# Patient Record
Sex: Female | Born: 1980 | Race: Black or African American | Hispanic: No | Smoking: Current every day smoker
Health system: Southern US, Community
[De-identification: ages and names within clinical notes are randomized; demographics above are authoritative.]

## PROBLEM LIST (undated history)

## (undated) ENCOUNTER — Inpatient Hospital Stay (HOSPITAL_COMMUNITY): Payer: Self-pay

## (undated) VITALS — BP 121/78 | HR 95 | Temp 97.5°F | Resp 16 | Ht 66.5 in | Wt 195.0 lb

## (undated) DIAGNOSIS — R87629 Unspecified abnormal cytological findings in specimens from vagina: Secondary | ICD-10-CM

## (undated) DIAGNOSIS — N870 Mild cervical dysplasia: Secondary | ICD-10-CM

## (undated) DIAGNOSIS — F4310 Post-traumatic stress disorder, unspecified: Secondary | ICD-10-CM

## (undated) DIAGNOSIS — D649 Anemia, unspecified: Secondary | ICD-10-CM

## (undated) DIAGNOSIS — G43909 Migraine, unspecified, not intractable, without status migrainosus: Secondary | ICD-10-CM

## (undated) DIAGNOSIS — R519 Headache, unspecified: Secondary | ICD-10-CM

## (undated) DIAGNOSIS — N760 Acute vaginitis: Secondary | ICD-10-CM

## (undated) DIAGNOSIS — F32A Depression, unspecified: Secondary | ICD-10-CM

## (undated) DIAGNOSIS — F53 Postpartum depression: Secondary | ICD-10-CM

## (undated) DIAGNOSIS — E049 Nontoxic goiter, unspecified: Secondary | ICD-10-CM

## (undated) DIAGNOSIS — A630 Anogenital (venereal) warts: Secondary | ICD-10-CM

## (undated) DIAGNOSIS — Z8619 Personal history of other infectious and parasitic diseases: Secondary | ICD-10-CM

## (undated) DIAGNOSIS — F329 Major depressive disorder, single episode, unspecified: Secondary | ICD-10-CM

## (undated) DIAGNOSIS — I10 Essential (primary) hypertension: Secondary | ICD-10-CM

## (undated) DIAGNOSIS — F319 Bipolar disorder, unspecified: Secondary | ICD-10-CM

## (undated) DIAGNOSIS — O210 Mild hyperemesis gravidarum: Secondary | ICD-10-CM

## (undated) DIAGNOSIS — F419 Anxiety disorder, unspecified: Secondary | ICD-10-CM

## (undated) DIAGNOSIS — E669 Obesity, unspecified: Secondary | ICD-10-CM

## (undated) DIAGNOSIS — B9689 Other specified bacterial agents as the cause of diseases classified elsewhere: Secondary | ICD-10-CM

## (undated) DIAGNOSIS — E041 Nontoxic single thyroid nodule: Secondary | ICD-10-CM

## (undated) DIAGNOSIS — R51 Headache: Secondary | ICD-10-CM

## (undated) DIAGNOSIS — O99345 Other mental disorders complicating the puerperium: Secondary | ICD-10-CM

## (undated) HISTORY — DX: Other specified bacterial agents as the cause of diseases classified elsewhere: B96.89

## (undated) HISTORY — DX: Depression, unspecified: F32.A

## (undated) HISTORY — DX: Anemia, unspecified: D64.9

## (undated) HISTORY — DX: Mild hyperemesis gravidarum: O21.0

## (undated) HISTORY — DX: Anxiety disorder, unspecified: F41.9

## (undated) HISTORY — DX: Mild cervical dysplasia: N87.0

## (undated) HISTORY — DX: Migraine, unspecified, not intractable, without status migrainosus: G43.909

## (undated) HISTORY — DX: Essential (primary) hypertension: I10

## (undated) HISTORY — DX: Anogenital (venereal) warts: A63.0

## (undated) HISTORY — DX: Personal history of other infectious and parasitic diseases: Z86.19

## (undated) HISTORY — DX: Post-traumatic stress disorder, unspecified: F43.10

## (undated) HISTORY — PX: WISDOM TOOTH EXTRACTION: SHX21

## (undated) HISTORY — DX: Other specified bacterial agents as the cause of diseases classified elsewhere: N76.0

## (undated) HISTORY — DX: Major depressive disorder, single episode, unspecified: F32.9

## (undated) HISTORY — DX: Obesity, unspecified: E66.9

---
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
## 2004-03-25 ENCOUNTER — Emergency Department (HOSPITAL_COMMUNITY): Admission: EM | Admit: 2004-03-25 | Discharge: 2004-03-25 | Payer: Self-pay | Admitting: Emergency Medicine

## 2004-10-24 ENCOUNTER — Inpatient Hospital Stay (HOSPITAL_COMMUNITY): Admission: AD | Admit: 2004-10-24 | Discharge: 2004-10-24 | Payer: Self-pay | Admitting: Obstetrics & Gynecology

## 2004-12-04 ENCOUNTER — Inpatient Hospital Stay: Admission: AD | Admit: 2004-12-04 | Discharge: 2004-12-04 | Payer: Self-pay | Admitting: *Deleted

## 2005-01-11 ENCOUNTER — Other Ambulatory Visit: Admission: RE | Admit: 2005-01-11 | Discharge: 2005-01-11 | Payer: Self-pay | Admitting: Obstetrics and Gynecology

## 2005-02-11 ENCOUNTER — Inpatient Hospital Stay (HOSPITAL_COMMUNITY): Admission: AD | Admit: 2005-02-11 | Discharge: 2005-02-11 | Payer: Self-pay | Admitting: Obstetrics and Gynecology

## 2005-03-15 ENCOUNTER — Other Ambulatory Visit: Admission: RE | Admit: 2005-03-15 | Discharge: 2005-03-15 | Payer: Self-pay | Admitting: Obstetrics and Gynecology

## 2005-05-05 ENCOUNTER — Inpatient Hospital Stay (HOSPITAL_COMMUNITY): Admission: AD | Admit: 2005-05-05 | Discharge: 2005-05-05 | Payer: Self-pay | Admitting: Obstetrics and Gynecology

## 2005-05-07 ENCOUNTER — Inpatient Hospital Stay (HOSPITAL_COMMUNITY): Admission: AD | Admit: 2005-05-07 | Discharge: 2005-05-07 | Payer: Self-pay | Admitting: *Deleted

## 2005-06-05 ENCOUNTER — Inpatient Hospital Stay (HOSPITAL_COMMUNITY): Admission: AD | Admit: 2005-06-05 | Discharge: 2005-06-05 | Payer: Self-pay | Admitting: Obstetrics and Gynecology

## 2005-06-28 ENCOUNTER — Inpatient Hospital Stay (HOSPITAL_COMMUNITY): Admission: AD | Admit: 2005-06-28 | Discharge: 2005-07-02 | Payer: Self-pay | Admitting: Obstetrics and Gynecology

## 2005-07-06 ENCOUNTER — Inpatient Hospital Stay (HOSPITAL_COMMUNITY): Admission: AD | Admit: 2005-07-06 | Discharge: 2005-07-07 | Payer: Self-pay | Admitting: Obstetrics and Gynecology

## 2006-01-29 ENCOUNTER — Emergency Department (HOSPITAL_COMMUNITY): Admission: EM | Admit: 2006-01-29 | Discharge: 2006-01-29 | Payer: Self-pay | Admitting: Emergency Medicine

## 2007-05-06 ENCOUNTER — Emergency Department (HOSPITAL_COMMUNITY): Admission: EM | Admit: 2007-05-06 | Discharge: 2007-05-07 | Payer: Self-pay | Admitting: Emergency Medicine

## 2007-05-07 ENCOUNTER — Inpatient Hospital Stay (HOSPITAL_COMMUNITY): Admission: RE | Admit: 2007-05-07 | Discharge: 2007-05-10 | Payer: Self-pay | Admitting: Psychiatry

## 2007-05-07 ENCOUNTER — Ambulatory Visit: Payer: Self-pay | Admitting: Psychiatry

## 2007-05-15 ENCOUNTER — Ambulatory Visit (HOSPITAL_COMMUNITY): Payer: Self-pay | Admitting: Marriage and Family Therapist

## 2007-05-24 ENCOUNTER — Ambulatory Visit (HOSPITAL_COMMUNITY): Payer: Self-pay | Admitting: Marriage and Family Therapist

## 2007-07-15 ENCOUNTER — Ambulatory Visit (HOSPITAL_COMMUNITY): Payer: Self-pay | Admitting: Marriage and Family Therapist

## 2007-07-24 ENCOUNTER — Ambulatory Visit (HOSPITAL_COMMUNITY): Payer: Self-pay | Admitting: Marriage and Family Therapist

## 2007-07-30 ENCOUNTER — Ambulatory Visit (HOSPITAL_COMMUNITY): Payer: Self-pay | Admitting: Marriage and Family Therapist

## 2008-04-10 DIAGNOSIS — O210 Mild hyperemesis gravidarum: Secondary | ICD-10-CM

## 2008-04-10 HISTORY — DX: Mild hyperemesis gravidarum: O21.0

## 2008-11-03 ENCOUNTER — Inpatient Hospital Stay (HOSPITAL_COMMUNITY): Admission: AD | Admit: 2008-11-03 | Discharge: 2008-11-03 | Payer: Self-pay | Admitting: Obstetrics and Gynecology

## 2009-01-16 ENCOUNTER — Inpatient Hospital Stay (HOSPITAL_COMMUNITY): Admission: AD | Admit: 2009-01-16 | Discharge: 2009-01-17 | Payer: Self-pay | Admitting: Obstetrics and Gynecology

## 2009-01-28 ENCOUNTER — Other Ambulatory Visit: Payer: Self-pay | Admitting: Obstetrics and Gynecology

## 2009-01-28 ENCOUNTER — Inpatient Hospital Stay (HOSPITAL_COMMUNITY): Admission: AD | Admit: 2009-01-28 | Discharge: 2009-01-31 | Payer: Self-pay | Admitting: Obstetrics and Gynecology

## 2010-07-14 LAB — CBC
HCT: 27.8 % — ABNORMAL LOW (ref 36.0–46.0)
HCT: 30.8 % — ABNORMAL LOW (ref 36.0–46.0)
Hemoglobin: 10 g/dL — ABNORMAL LOW (ref 12.0–15.0)
Hemoglobin: 9.3 g/dL — ABNORMAL LOW (ref 12.0–15.0)
MCHC: 32.6 g/dL (ref 30.0–36.0)
MCHC: 33.3 g/dL (ref 30.0–36.0)
MCV: 90.5 fL (ref 78.0–100.0)
MCV: 91.1 fL (ref 78.0–100.0)
Platelets: 221 10*3/uL (ref 150–400)
Platelets: 262 10*3/uL (ref 150–400)
RBC: 3.08 MIL/uL — ABNORMAL LOW (ref 3.87–5.11)
RBC: 3.38 MIL/uL — ABNORMAL LOW (ref 3.87–5.11)
RDW: 14 % (ref 11.5–15.5)
RDW: 14 % (ref 11.5–15.5)
WBC: 8.4 10*3/uL (ref 4.0–10.5)
WBC: 9.6 10*3/uL (ref 4.0–10.5)

## 2010-07-14 LAB — URINALYSIS, ROUTINE W REFLEX MICROSCOPIC
Bilirubin Urine: NEGATIVE
Bilirubin Urine: NEGATIVE
Glucose, UA: NEGATIVE mg/dL
Glucose, UA: NEGATIVE mg/dL
Hgb urine dipstick: NEGATIVE
Hgb urine dipstick: NEGATIVE
Ketones, ur: 15 mg/dL — AB
Ketones, ur: 15 mg/dL — AB
Nitrite: NEGATIVE
Nitrite: NEGATIVE
Protein, ur: NEGATIVE mg/dL
Protein, ur: NEGATIVE mg/dL
Specific Gravity, Urine: 1.02 (ref 1.005–1.030)
Specific Gravity, Urine: 1.02 (ref 1.005–1.030)
Urobilinogen, UA: 2 mg/dL — ABNORMAL HIGH (ref 0.0–1.0)
Urobilinogen, UA: 1 mg/dL (ref 0.0–1.0)
pH: 6.5 (ref 5.0–8.0)
pH: 6 (ref 5.0–8.0)

## 2010-07-14 LAB — URINE MICROSCOPIC-ADD ON

## 2010-07-14 LAB — CCBB MATERNAL DONOR DRAW

## 2010-07-14 LAB — RPR: RPR Ser Ql: NONREACTIVE

## 2010-07-17 LAB — URINALYSIS, MICROSCOPIC ONLY
Bilirubin Urine: NEGATIVE
Glucose, UA: NEGATIVE mg/dL
Hgb urine dipstick: NEGATIVE
Ketones, ur: NEGATIVE mg/dL
Leukocytes, UA: NEGATIVE
Nitrite: POSITIVE — AB
Protein, ur: NEGATIVE mg/dL
Specific Gravity, Urine: 1.025 (ref 1.005–1.030)
Urobilinogen, UA: 1 mg/dL (ref 0.0–1.0)
pH: 7 (ref 5.0–8.0)

## 2010-07-17 LAB — URINE CULTURE: Colony Count: 7000
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
## 2010-08-26 NOTE — Op Note (Signed)
NAME:  Monica [name], Monica [name] NO.:  [id number]   MEDICAL RECORD NO.:  [id number]          PATIENT TYPE:  INP   LOCATION:  9141                          [facility]ACILITY:  [facility]   PHYSICIAN:  Janine Limbo, M.D.DATE OF BIRTH:  [birth year]   DATE OF PROCEDURE:  06/30/2005  DATE OF DISCHARGE:                                 OPERATIVE REPORT   PREOPERATIVE DIAGNOSES:  1.  Postdates pregnancy.  2.  Chorioamnionitis.  3.  Nonreassuring fetal heart rate tracing.   POSTOPERATIVE DIAGNOSES:  1.  Postdates pregnancy.  2.  Chorioamnionitis.  3.  Nonreassuring fetal heart rate tracing.   PROCEDURE:  Primary low transverse cesarean section.   SURGEON:  Dr. Leonard Schwartz   FIRST ASSISTANT:  Elsie Ra, Certified Nurse Midwife.   ANESTHETIC:  Epidural.   DISPOSITION:  Monica [name] is a 30 year old female, gravida 1 para 0, who  was admitted for induction of labor on June 28, 2005. She was given Cytotec  on March 21,2007, and then Pitocin was started on the morning of March  22,2007. The patient labored slowly. She did dilate her cervix to 7 cm. She  began having variable and late decelerations. An amnioinfusion was begun.  The variables did improve, but then the patient elevated her baseline to 165  beats per minute, and she lost her variability. The patient was noted to  have a temperature of 102. We reviewed our management options, and I  recommended that we proceed with cesarean delivery. The risk and benefits of  all her options were discussed. The patient elected to proceed with cesarean  section. She understood the specific risks associated with cesarean section  which included, but are not limited to, anesthetic complications, bleeding,  infection, and possible damage to the surrounding organs.   FINDINGS:  A 7 pounds 5 ounce female infant Market researcher) was delivered from a  cephalic presentation. The Apgars were 8 at one minute and 9 at five  minutes. The cord  blood pH was 7.29. The uterus, fallopian tubes, and the  ovaries were normal for the gravid state.   PROCEDURE:  The patient was taken to the operating room where it was  determined that the epidural she had received for labor would be adequate  for cesarean delivery. The patient's abdomen was prepped with multiple  layers of Betadine. A Foley catheter had previously been placed. The patient  was then sterilely draped. The lower abdomen was injected with 10 mL of 0.5%  Marcaine with epinephrine. A low transverse incision was made in the abdomen  and carried sharply through the subcutaneous tissue, the fascia, and the  anterior peritoneum. An incision was made in the lower uterine segment and  extended transversely. The fetal head was delivered with the assistance of a  Mityvac vacuum extractor. The mouth and nose were suctioned. The remainder  of the infant was then delivered. The infant was noted to have a significant  amount of kaput. Routine cord blood studies were obtained. The placenta was  then removed. The uterine cavity was cleaned of amniotic fluid, clotted  blood, and membranes. The uterine incision was  closed using a running suture  of 2-0 Vicryl followed by an imbricating suture of 2-0 Vicryl. Hemostasis  was adequate. The pelvis was vigorously irrigated. The anterior peritoneum  and the abdominal musculature were reapproximated in the midline using 2-0  Vicryl. The fascia and the subcutaneous layer were irrigated. The fascia was  closed using a running suture of 0 Vicryl followed by 3 interrupted sutures  of 0 Vicryl. A Jackson-Pratt drain was placed in the subcutaneous layer and  brought out through the left lower quadrant. It was sutured into place using  3-0 silk. The subcutaneous layer was closed using a running suture of 0  Vicryl. The skin was reapproximated using a subcuticular suture of 3-0  Monocryl. Sponge, needle, and instrument counts were correct on 2 occasions.   The estimated blood loss was 800 mL. The patient tolerated her procedure  well. The patient was taken to the recovery room in stable condition. The  infant was taken to the full-term nursery in stable condition.      Janine Limbo, M.D.  Electronically Signed     AVS/MEDQ  D:  06/30/2005  T:  07/01/2005  Job:  [id number]
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
## 2010-08-26 NOTE — Discharge Summary (Signed)
NAME:  Monica [name], Monica [name] NO.:  [id number]   MEDICAL RECORD NO.:  [id number]          PATIENT TYPE:  INP   LOCATION:  9141                          FACILITY:  [facility]   PHYSICIAN:  Hal Morales, M.D.DATE OF BIRTH:  Aug 16, 1980   DATE OF ADMISSION:  06/28/2005  DATE OF DISCHARGE:  07/02/2005                                 DISCHARGE SUMMARY   ADMISSION DIAGNOSIS:  Intrauterine pregnancy at term, induction of labor,  post dates.   POSTOPERATIVE DIAGNOSIS:  Post dates pregnancy, chorioamnionitis,  nonreassuring fetal heart rate tracing.   PROCEDURE:  Primary low transverse cesarean section.   DISCHARGE DIAGNOSIS:  Intrauterine pregnancy delivered, chorioamnionitis,  nonreassuring fetal heart rate tracing and primary low transverse cesarean  section.   [name] is a 30 year old gravida 1, para 0 who presents at 41-2/7 weeks  for induction of labor and she received Cytotec vaginally on 06/29/2005 x2  doses and began Pitocin the morning of 03/22. At that time she was noted to  be 2 cm dilated. She progressed throughout the day to 7-8 cm dilated.  Fetal  heart rate baseline increased to 160's-170's. Variability decreased at that  time. The patient began to have variable decelerations and nonrepetitive  late decelerations. Her temperature became elevated to 100.7. Consult with  Dr. Stefano Gaul who came in to review strip and discuss options with the  patient. In light of fetal heart rate tracing and suspicion of  chorioamnionitis, C-section was recommended. The risks, benefits and  alternatives were reviewed.  The patient verbalized her understanding and  decision to proceed with cesarean delivery.  This was performed on  06/30/2005 with Dr. Marline Backbone as surgeon.  The patient gave birth to a  7 pound 5 ounce female infant named Kaidence with Apgar scores of 8 at 1-  minute and 9 at 5 minutes.  Cord pH 7.29. The patient has done well in the  postoperative period.   Her temperature elevated to 101.3 at 3:30 a.m. on  06/30/2005.  She has been afebrile since that time. She received Cefotan 1  gram IV for 24 hours and has remained afebrile with her vital signs stable.  Her hemoglobin on the first postoperative day was 8.8 down from 10.4. The  patient has remained asymptomatic in her anemia and declined blood  transfusion.  She is up and moving without any dizziness or any problems.  Her JP drain was removed on her first postoperative day without difficulty.  Her incision is clean, dry and intact and on this her second postoperative  day she requested early discharge in light of her stable condition.  She is  discharged home with instructions per [facility] handout.   DISCHARGE MEDICATIONS:  1.  Motrin 600 mg p.o. q.6 h p.r.n. pain.  2.  Tylox one to two p.o. q. 3-4 hours p.r.n. pain.  3.  Prenatal vitamins.   Discharge follow-up will be at [facility] in 6 weeks.  The patient will decide on  contraceptive options at that time.      Rica Koyanagi, C.N.M.      Hal Morales, M.D.  Electronically Signed    SDM/MEDQ  D:  07/02/2005  T:  07/04/2005  Job:  [id number]

## 2010-09-05 ENCOUNTER — Emergency Department (HOSPITAL_COMMUNITY)
Admission: EM | Admit: 2010-09-05 | Discharge: 2010-09-06 | Disposition: A | Discharge status: Other Acute Inpatient Hospital | Payer: Managed Care, Other (non HMO) | Attending: Emergency Medicine | Admitting: Emergency Medicine

## 2010-09-05 DIAGNOSIS — T50901A Poisoning by unspecified drugs, medicaments and biological substances, accidental (unintentional), initial encounter: Secondary | ICD-10-CM | POA: Insufficient documentation

## 2010-09-05 DIAGNOSIS — R404 Transient alteration of awareness: Secondary | ICD-10-CM | POA: Insufficient documentation

## 2010-09-05 DIAGNOSIS — T50902A Poisoning by unspecified drugs, medicaments and biological substances, intentional self-harm, initial encounter: Secondary | ICD-10-CM | POA: Insufficient documentation

## 2010-09-05 DIAGNOSIS — F329 Major depressive disorder, single episode, unspecified: Secondary | ICD-10-CM | POA: Insufficient documentation

## 2010-09-05 DIAGNOSIS — F3289 Other specified depressive episodes: Secondary | ICD-10-CM | POA: Insufficient documentation

## 2010-09-05 LAB — SALICYLATE LEVEL: Salicylate Lvl: <2 mg/dL — ABNORMAL LOW (ref 2.8–20.0)

## 2010-09-05 LAB — RAPID URINE DRUG SCREEN, HOSP PERFORMED
Amphetamines: NOT DETECTED
Barbiturates: NOT DETECTED
Benzodiazepines: NOT DETECTED
Cocaine: NOT DETECTED
Opiates: NOT DETECTED
Tetrahydrocannabinol: NOT DETECTED

## 2010-09-05 LAB — DIFFERENTIAL
Basophils Absolute: 0 10*3/uL (ref 0.0–0.1)
Basophils Relative: 1 % (ref 0–1)
Eosinophils Relative: 3 % (ref 0–5)
Lymphocytes Relative: 46 % (ref 12–46)
Lymphs Abs: 2.4 10*3/uL (ref 0.7–4.0)
Monocytes Relative: 9 % (ref 3–12)
Neutro Abs: 2.2 10*3/uL (ref 1.7–7.7)
Neutrophils Relative %: 41 % — ABNORMAL LOW (ref 43–77)

## 2010-09-05 LAB — COMPREHENSIVE METABOLIC PANEL
ALT: 15 U/L (ref 0–35)
AST: 22 U/L (ref 0–37)
Albumin: 3.4 g/dL — ABNORMAL LOW (ref 3.5–5.2)
Alkaline Phosphatase: 43 U/L (ref 39–117)
CO2: 22 mEq/L (ref 19–32)
Calcium: 8.5 mg/dL (ref 8.4–10.5)
Chloride: 106 mEq/L (ref 96–112)
Creatinine, Ser: 0.74 mg/dL (ref 0.4–1.2)
GFR calc Af Amer: >60 mL/min (ref >60)
Potassium: 3.3 mEq/L — ABNORMAL LOW (ref 3.5–5.1)
Sodium: 139 mEq/L (ref 135–145)
Total Bilirubin: 0.4 mg/dL (ref 0.3–1.2)
Total Protein: 7 g/dL (ref 6.0–8.3)

## 2010-09-05 LAB — CBC
HCT: 36.3 % (ref 36.0–46.0)
Hemoglobin: 11.7 g/dL — ABNORMAL LOW (ref 12.0–15.0)
MCH: 30.1 pg (ref 26.0–34.0)
MCHC: 32.2 g/dL (ref 30.0–36.0)
MCV: 93.3 fL (ref 78.0–100.0)
Platelets: 211 10*3/uL (ref 150–400)
RBC: 3.89 MIL/uL (ref 3.87–5.11)
RDW: 12.8 % (ref 11.5–15.5)
WBC: 5.2 10*3/uL (ref 4.0–10.5)

## 2010-09-06 ENCOUNTER — Inpatient Hospital Stay (HOSPITAL_COMMUNITY)
Admission: AD | Admit: 2010-09-06 | Discharge: 2010-09-09 | DRG: 885 | Disposition: A | Discharge status: Home / Self Care | Payer: Managed Care, Other (non HMO) | Source: Ambulatory Visit | Attending: Psychiatry | Admitting: Psychiatry

## 2010-09-06 DIAGNOSIS — F339 Major depressive disorder, recurrent, unspecified: Secondary | ICD-10-CM

## 2010-09-06 DIAGNOSIS — F17200 Nicotine dependence, unspecified, uncomplicated: Secondary | ICD-10-CM

## 2010-09-06 DIAGNOSIS — Z630 Problems in relationship with spouse or partner: Secondary | ICD-10-CM

## 2010-09-06 DIAGNOSIS — T450X4A Poisoning by antiallergic and antiemetic drugs, undetermined, initial encounter: Secondary | ICD-10-CM

## 2010-09-06 DIAGNOSIS — F332 Major depressive disorder, recurrent severe without psychotic features: Principal | ICD-10-CM

## 2010-09-06 DIAGNOSIS — T39314A Poisoning by propionic acid derivatives, undetermined, initial encounter: Secondary | ICD-10-CM

## 2010-09-06 DIAGNOSIS — T394X2A Poisoning by antirheumatics, not elsewhere classified, intentional self-harm, initial encounter: Secondary | ICD-10-CM

## 2010-09-06 DIAGNOSIS — T398X2A Poisoning by other nonopioid analgesics and antipyretics, not elsewhere classified, intentional self-harm, initial encounter: Secondary | ICD-10-CM

## 2010-09-06 DIAGNOSIS — IMO0002 Reserved for concepts with insufficient information to code with codable children: Secondary | ICD-10-CM

## 2010-09-06 DIAGNOSIS — T50992A Poisoning by other drugs, medicaments and biological substances, intentional self-harm, initial encounter: Secondary | ICD-10-CM

## 2010-09-06 DIAGNOSIS — F331 Major depressive disorder, recurrent, moderate: Secondary | ICD-10-CM

## 2010-09-06 LAB — PREGNANCY, URINE: Preg Test, Ur: NEGATIVE

## 2010-09-07 LAB — HEPATIC FUNCTION PANEL
ALT: 14 U/L (ref 0–35)
AST: 18 U/L (ref 0–37)
Albumin: 3.5 g/dL (ref 3.5–5.2)
Alkaline Phosphatase: 40 U/L (ref 39–117)
Bilirubin, Direct: 0.1 mg/dL (ref 0.0–0.3)
Indirect Bilirubin: 0.4 mg/dL (ref 0.3–0.9)
Total Bilirubin: 0.5 mg/dL (ref 0.3–1.2)
Total Protein: 7.2 g/dL (ref 6.0–8.3)

## 2010-09-07 LAB — TSH: TSH: 1.574 u[IU]/mL (ref 0.350–4.500)

## 2010-09-09 DIAGNOSIS — F339 Major depressive disorder, recurrent, unspecified: Secondary | ICD-10-CM

## 2010-09-09 DIAGNOSIS — IMO0002 Reserved for concepts with insufficient information to code with codable children: Secondary | ICD-10-CM

## 2010-09-09 DIAGNOSIS — Z630 Problems in relationship with spouse or partner: Secondary | ICD-10-CM
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
## 2010-11-11 ENCOUNTER — Inpatient Hospital Stay (HOSPITAL_COMMUNITY)
Admission: AD | Admit: 2010-11-11 | Discharge: 2010-11-11 | Disposition: A | Discharge status: Home / Self Care | Payer: Managed Care, Other (non HMO) | Source: Ambulatory Visit | Attending: Obstetrics and Gynecology | Admitting: Obstetrics and Gynecology

## 2010-11-11 ENCOUNTER — Other Ambulatory Visit: Payer: Self-pay | Admitting: Obstetrics and Gynecology

## 2010-11-11 ENCOUNTER — Encounter (HOSPITAL_COMMUNITY): Payer: Self-pay

## 2010-11-11 DIAGNOSIS — Z3200 Encounter for pregnancy test, result unknown: Secondary | ICD-10-CM | POA: Insufficient documentation

## 2010-11-11 LAB — CBC
MCH: 31.2 pg (ref 26.0–34.0)
MCHC: 32.5 g/dL (ref 30.0–36.0)
Platelets: 186 10*3/uL (ref 150–400)
RDW: 12.2 % (ref 11.5–15.5)

## 2010-11-11 LAB — URINALYSIS, ROUTINE W REFLEX MICROSCOPIC
Glucose, UA: NEGATIVE mg/dL
Hgb urine dipstick: NEGATIVE
Specific Gravity, Urine: 1.025 (ref 1.005–1.030)
Urobilinogen, UA: 0.2 mg/dL (ref 0.0–1.0)
pH: 6.5 (ref 5.0–8.0)

## 2010-11-11 LAB — HCG, QUANTITATIVE, PREGNANCY: hCG, Beta Chain, Quant, S: 49 m[IU]/mL — ABNORMAL HIGH (ref <5)
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
## 2010-11-11 NOTE — Progress Notes (Signed)
Pt states she had her LMP on 6-25 then spotted on 7-10, no bleeding since that time. Has periods off and on of cramping, none now. States she talked with Dr. Debria Garret nurse today and was told to come so she could find out how far she is and that everything is OK.
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
## 2010-11-28 ENCOUNTER — Inpatient Hospital Stay (HOSPITAL_COMMUNITY)
Admission: AD | Admit: 2010-11-28 | Discharge: 2010-11-28 | Disposition: A | Discharge status: Home / Self Care | Payer: Managed Care, Other (non HMO) | Source: Ambulatory Visit | Attending: Obstetrics and Gynecology | Admitting: Obstetrics and Gynecology

## 2010-11-28 ENCOUNTER — Inpatient Hospital Stay (HOSPITAL_COMMUNITY): Payer: Managed Care, Other (non HMO)

## 2010-11-28 ENCOUNTER — Encounter (HOSPITAL_COMMUNITY): Payer: Self-pay

## 2010-11-28 DIAGNOSIS — N930 Postcoital and contact bleeding: Secondary | ICD-10-CM

## 2010-11-28 DIAGNOSIS — O200 Threatened abortion: Secondary | ICD-10-CM | POA: Insufficient documentation

## 2010-11-28 DIAGNOSIS — O209 Hemorrhage in early pregnancy, unspecified: Secondary | ICD-10-CM

## 2010-11-28 LAB — URINALYSIS, ROUTINE W REFLEX MICROSCOPIC
Glucose, UA: NEGATIVE mg/dL
Leukocytes, UA: NEGATIVE
Protein, ur: NEGATIVE mg/dL
pH: 6 (ref 5.0–8.0)

## 2010-11-28 LAB — HCG, QUANTITATIVE, PREGNANCY: hCG, Beta Chain, Quant, S: 20057 m[IU]/mL — ABNORMAL HIGH (ref <5)

## 2010-11-28 LAB — URINE MICROSCOPIC-ADD ON
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
## 2010-11-28 NOTE — Progress Notes (Signed)
Having sex, went to the bathroom, noted blood when wiped, passed  A quarted sized clot. No prior bleeding this preg.

## 2010-12-01 LAB — RPR: RPR: NONREACTIVE

## 2010-12-01 LAB — HEPATITIS B SURFACE ANTIGEN: Hepatitis B Surface Ag: NEGATIVE

## 2010-12-01 LAB — ABO/RH: RH Type: POSITIVE

## 2010-12-29 LAB — URINALYSIS, ROUTINE W REFLEX MICROSCOPIC
Glucose, UA: NEGATIVE
Hgb urine dipstick: NEGATIVE
Leukocytes, UA: NEGATIVE
Specific Gravity, Urine: 1.037 — ABNORMAL HIGH
Urobilinogen, UA: 1

## 2010-12-29 LAB — CBC
MCHC: 34.3
MCV: 85
RBC: 4.12

## 2010-12-29 LAB — URINE MICROSCOPIC-ADD ON

## 2010-12-29 LAB — COMPREHENSIVE METABOLIC PANEL
AST: 26
BUN: 7
CO2: 22
Calcium: 9.1
Creatinine, Ser: 0.73
GFR calc Af Amer: >60
GFR calc non Af Amer: >60
Glucose, Bld: 99

## 2010-12-29 LAB — DIFFERENTIAL
Lymphocytes Relative: 24
Lymphs Abs: 1.7
Neutro Abs: 4.9
Neutrophils Relative %: 69

## 2010-12-29 LAB — ACETAMINOPHEN LEVEL: Acetaminophen (Tylenol), Serum: 81.3 — ABNORMAL HIGH

## 2010-12-29 LAB — RAPID URINE DRUG SCREEN, HOSP PERFORMED
Barbiturates: NOT DETECTED
Benzodiazepines: NOT DETECTED
Cocaine: NOT DETECTED

## 2010-12-29 LAB — SALICYLATE LEVEL: Salicylate Lvl: <4

## 2010-12-29 LAB — HEPATIC FUNCTION PANEL
AST: 36
Alkaline Phosphatase: 51
Bilirubin, Direct: 0.1
Total Bilirubin: 0.6

## 2011-01-14 ENCOUNTER — Encounter (HOSPITAL_COMMUNITY): Payer: Self-pay | Admitting: *Deleted

## 2011-01-14 ENCOUNTER — Inpatient Hospital Stay (HOSPITAL_COMMUNITY)
Admission: AD | Admit: 2011-01-14 | Discharge: 2011-01-14 | Disposition: A | Discharge status: Home / Self Care | Payer: Managed Care, Other (non HMO) | Source: Ambulatory Visit | Attending: Obstetrics and Gynecology | Admitting: Obstetrics and Gynecology

## 2011-01-14 DIAGNOSIS — O99891 Other specified diseases and conditions complicating pregnancy: Secondary | ICD-10-CM | POA: Insufficient documentation

## 2011-01-14 DIAGNOSIS — K5900 Constipation, unspecified: Secondary | ICD-10-CM | POA: Insufficient documentation

## 2011-01-14 DIAGNOSIS — R109 Unspecified abdominal pain: Secondary | ICD-10-CM | POA: Insufficient documentation

## 2011-01-14 DIAGNOSIS — N898 Other specified noninflammatory disorders of vagina: Secondary | ICD-10-CM | POA: Insufficient documentation

## 2011-01-14 DIAGNOSIS — K581 Irritable bowel syndrome with constipation: Secondary | ICD-10-CM

## 2011-01-14 LAB — URINALYSIS, ROUTINE W REFLEX MICROSCOPIC
Glucose, UA: NEGATIVE mg/dL
Leukocytes, UA: NEGATIVE
pH: 7 (ref 5.0–8.0)

## 2011-01-14 LAB — WET PREP, GENITAL
Clue Cells Wet Prep HPF POC: NONE SEEN
Trich, Wet Prep: NONE SEEN
Yeast Wet Prep HPF POC: NONE SEEN

## 2011-01-14 NOTE — ED Notes (Signed)
N.Smith,CNM at bedside with U/S fetal movement and FHR observed

## 2011-01-14 NOTE — Progress Notes (Signed)
Onset of abdominal cramping since yesterday had fluid leaking out of vagina during the night and pantyliners are wet, patient reports being 13 weeks pregnatn.

## 2011-01-14 NOTE — ED Provider Notes (Signed)
History     Chief Complaint  Patient presents with  . Abdominal Cramping  . Vaginal Discharge   Abdominal Cramping This is a new problem. The current episode started yesterday. The onset quality is sudden. The problem occurs constantly. The problem has been unchanged. The pain is located in the generalized abdominal region. The pain is at a severity of 5/10. The pain is mild. The quality of the pain is colicky and cramping. The abdominal pain does not radiate. Associated symptoms include constipation. Pertinent negatives include no anorexia, arthralgias, belching, dysuria, fever, flatus, frequency, headaches, hematochezia, hematuria, melena, myalgias, nausea, vomiting or weight loss. The pain is aggravated by nothing. The pain is relieved by nothing. She has tried nothing for the symptoms.  Vaginal Discharge Associated symptoms include constipation. Pertinent negatives include no anorexia, dysuria, fever, frequency, headaches, hematuria, nausea or vomiting.  She denies vaginal bleeding.  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  She reports constipation and did have a small bowel movement yesterday.  She reports the pain as intermittent and crampy and occurring in the upper and lower abdominal areas.    OB History    Grav Para Term Preterm Abortions TAB SAB Ect Mult Living   5 2 2  2  0 2 0 0 2      Past Medical History  Diagnosis Date  . No pertinent past medical history     Past Surgical History  Procedure Date  . Cesarean section     History reviewed. No pertinent family history.  History  Substance Use Topics  . Smoking status: Former Smoker -- 0.5 packs/day for 10 years    Types: Cigarettes    Quit date: 11/09/2010  . Smokeless tobacco: Not on file  . Alcohol Use: No    Allergies: No Known Allergies  Prescriptions prior to admission  Medication Sig Dispense Refill  . acetaminophen (TYLENOL) 325 MG tablet Take 650  mg by mouth every 6 (six) hours as needed. Patient used this medication for pain.       . Boric Acid POWD Place 600 mg vaginally at bedtime. Pt uses vaginal boric acid vaginal suppositories.       . prenatal vitamin w/FE, FA (PRENATAL 1 + 1) 27-1 MG TABS Take 1 tablet by mouth daily.          Review of Systems  Constitutional: Negative.  Negative for fever and weight loss.  Eyes: Negative.   Respiratory: Negative.   Cardiovascular: Negative.   Gastrointestinal: Positive for constipation. Negative for nausea, vomiting, melena, hematochezia, anorexia and flatus.  Genitourinary: Negative.  Negative for dysuria, frequency and hematuria.  Musculoskeletal: Negative.  Negative for myalgias and arthralgias.  Skin: Negative.   Neurological: Negative for headaches.  Endo/Heme/Allergies: Negative.   Psychiatric/Behavioral: Negative.    Physical Exam   Blood pressure 112/72, temperature 98.6 F (37 C), temperature source Oral, height 5\' 7"  (1.702 m), weight 84.732 kg (186 lb 12.8 oz), last menstrual period 09/29/2010.  Physical Exam  Constitutional: She is oriented to person, place, and time. She appears well-developed and well-nourished.  HENT:  Head: Normocephalic and atraumatic.  Right Ear: External ear normal.  Left Ear: External ear normal.  Nose: Nose normal.  Eyes: Conjunctivae are normal. Pupils are equal, round, and reactive to light.  Neck: Normal range of motion. Neck supple. No thyromegaly present.  Cardiovascular: Normal rate, regular rhythm, normal heart sounds and intact distal pulses.  Respiratory: Effort normal and breath sounds normal.  GI: Soft. Bowel sounds are normal. She exhibits no distension and no mass. There is no tenderness. There is no rebound and no guarding.  Genitourinary: Vagina normal and uterus normal.  Musculoskeletal: Normal range of motion.  Neurological: She is alert and oriented to person, place, and time.  Skin: Skin is warm and dry.  Psychiatric:  She has a normal mood and affect. Her behavior is normal. Judgment and thought content normal.  Extrems:  Neg edema present.   Vagina:  Sm thin white d/c in vault.  No redness.  Nml rugae present. Cervix:  Visually closed/thick without discharge.  Neg CMT. Ut:  Mobile, NT, 13wk size.   Adnexa:  No masses or tenderness present.  Bedside ultrasound done due to MAU staff unable to hear FHTs.  Ultrasound with IUP present.  FHTs present visually and FHM noted.  FHTs with doppler 169.     Results for orders placed during the hospital encounter of 01/14/11 (from the past 24 hour(s))  URINALYSIS, ROUTINE W REFLEX MICROSCOPIC     Status: Normal   Collection Time   01/14/11  2:00 PM      Component Value Range   Color, Urine YELLOW  YELLOW    Appearance CLEAR  CLEAR    Specific Gravity, Urine 1.020  1.005 - 1.030    pH 7.0  5.0 - 8.0    Glucose, UA NEGATIVE  NEGATIVE (mg/dL)   Hgb urine dipstick NEGATIVE  NEGATIVE    Bilirubin Urine NEGATIVE  NEGATIVE    Ketones, ur NEGATIVE  NEGATIVE (mg/dL)   Protein, ur NEGATIVE  NEGATIVE (mg/dL)   Urobilinogen, UA 1.0  0.0 - 1.0 (mg/dL)   Nitrite NEGATIVE  NEGATIVE    Leukocytes, UA NEGATIVE  NEGATIVE   WET PREP, GENITAL     Status: Abnormal   Collection Time   01/14/11  3:05 PM      Component Value Range   Yeast, Wet Prep NONE SEEN  NONE SEEN    Trich, Wet Prep NONE SEEN  NONE SEEN    Clue Cells, Wet Prep NONE SEEN  NONE SEEN    WBC, Wet Prep HPF POC FEW (*) NONE SEEN     MAU Course  Procedures U/A Bedside US Wet prep GC/Chl cultures obtained  Assessment and Plan  IUP at 13.1wks Abdominal cramping Constipation Leukorrhea of pregnancy  Consult obtained with Dr. Pennie Rushing.  Pt discharged to home with instructions for relief of constipation.  SAB precautions reviewed as well as warning signs/symptoms.  Pt to RTO as scheduled for ROB visit.     Malea Swilling O. 01/14/2011, 3:11 PM

## 2011-02-07 ENCOUNTER — Emergency Department (HOSPITAL_COMMUNITY)
Admission: EM | Admit: 2011-02-07 | Discharge: 2011-02-07 | Disposition: A | Discharge status: Home / Self Care | Payer: Managed Care, Other (non HMO) | Attending: Emergency Medicine | Admitting: Emergency Medicine

## 2011-02-07 ENCOUNTER — Ambulatory Visit (HOSPITAL_COMMUNITY)
Admission: RE | Admit: 2011-02-07 | Discharge: 2011-02-07 | Disposition: A | Discharge status: Home / Self Care | Payer: Managed Care, Other (non HMO) | Attending: Psychiatry | Admitting: Psychiatry

## 2011-02-07 DIAGNOSIS — F329 Major depressive disorder, single episode, unspecified: Secondary | ICD-10-CM | POA: Insufficient documentation

## 2011-02-07 DIAGNOSIS — O99340 Other mental disorders complicating pregnancy, unspecified trimester: Secondary | ICD-10-CM | POA: Insufficient documentation

## 2011-02-07 DIAGNOSIS — F3289 Other specified depressive episodes: Secondary | ICD-10-CM | POA: Insufficient documentation

## 2011-05-09 ENCOUNTER — Other Ambulatory Visit: Payer: Self-pay | Admitting: Obstetrics and Gynecology

## 2011-06-06 ENCOUNTER — Encounter (INDEPENDENT_AMBULATORY_CARE_PROVIDER_SITE_OTHER): Payer: Managed Care, Other (non HMO) | Admitting: Obstetrics and Gynecology

## 2011-06-06 ENCOUNTER — Other Ambulatory Visit: Payer: Self-pay | Admitting: Obstetrics and Gynecology

## 2011-06-06 DIAGNOSIS — O368190 Decreased fetal movements, unspecified trimester, not applicable or unspecified: Secondary | ICD-10-CM

## 2011-06-12 ENCOUNTER — Encounter (INDEPENDENT_AMBULATORY_CARE_PROVIDER_SITE_OTHER): Payer: Managed Care, Other (non HMO) | Admitting: Obstetrics and Gynecology

## 2011-06-12 DIAGNOSIS — Z331 Pregnant state, incidental: Secondary | ICD-10-CM

## 2011-06-15 ENCOUNTER — Inpatient Hospital Stay (HOSPITAL_COMMUNITY)
Admission: AD | Admit: 2011-06-15 | Discharge: 2011-06-15 | Disposition: A | Discharge status: Home / Self Care | Payer: Managed Care, Other (non HMO) | Source: Ambulatory Visit | Attending: Obstetrics and Gynecology | Admitting: Obstetrics and Gynecology

## 2011-06-15 ENCOUNTER — Encounter (HOSPITAL_COMMUNITY): Payer: Self-pay | Admitting: Obstetrics and Gynecology

## 2011-06-15 DIAGNOSIS — O99891 Other specified diseases and conditions complicating pregnancy: Secondary | ICD-10-CM | POA: Insufficient documentation

## 2011-06-15 DIAGNOSIS — Z98891 History of uterine scar from previous surgery: Secondary | ICD-10-CM

## 2011-06-15 DIAGNOSIS — Z915 Personal history of self-harm: Secondary | ICD-10-CM

## 2011-06-15 DIAGNOSIS — O479 False labor, unspecified: Secondary | ICD-10-CM

## 2011-06-15 DIAGNOSIS — Z87898 Personal history of other specified conditions: Secondary | ICD-10-CM

## 2011-06-15 DIAGNOSIS — Z3480 Encounter for supervision of other normal pregnancy, unspecified trimester: Secondary | ICD-10-CM

## 2011-06-15 DIAGNOSIS — Z9151 Personal history of suicidal behavior: Secondary | ICD-10-CM

## 2011-06-15 DIAGNOSIS — Z8619 Personal history of other infectious and parasitic diseases: Secondary | ICD-10-CM | POA: Diagnosis not present

## 2011-06-15 DIAGNOSIS — Z87891 Personal history of nicotine dependence: Secondary | ICD-10-CM

## 2011-06-15 DIAGNOSIS — R109 Unspecified abdominal pain: Secondary | ICD-10-CM | POA: Insufficient documentation

## 2011-06-15 DIAGNOSIS — Z62819 Personal history of unspecified abuse in childhood: Secondary | ICD-10-CM

## 2011-06-15 DIAGNOSIS — Z8659 Personal history of other mental and behavioral disorders: Secondary | ICD-10-CM

## 2011-06-15 LAB — URINALYSIS, ROUTINE W REFLEX MICROSCOPIC
Glucose, UA: NEGATIVE mg/dL
Hgb urine dipstick: NEGATIVE
Ketones, ur: NEGATIVE mg/dL
Protein, ur: NEGATIVE mg/dL
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
## 2011-06-15 NOTE — Progress Notes (Signed)
Patient states she has been having contractions every 10-15 minutes and multiple episodes of diarrhea today, no vomiting. Denies any bleeding or leaking and reports good fetal movement.

## 2011-06-15 NOTE — Discharge Instructions (Signed)
Braxton Hicks Contractions also known as FALSE LABOR  *Please call the office even after hours to talk to a provider so we can determine if you need to come to the hospital for evaluation. *  Pregnancy is commonly associated with contractions of the uterus throughout the pregnancy. Towards the end of pregnancy (32 to 34 weeks), these contractions [facility] Willa Rough) can develop more often and may become more forceful. This is not true labor because these contractions do not result in opening (dilatation) and thinning of the cervix. They are sometimes difficult to tell apart from true labor because these contractions can be forceful and people have different pain tolerances. You should not feel embarrassed if you go to the hospital with false labor. Sometimes, the only way to tell if you are in true labor is for your caregiver to follow the changes in the cervix. How to tell the difference between true and false labor:  False labor.   The contractions of false labor are usually shorter, irregular and not as hard as those of true labor.   They are often felt in the front of the lower abdomen and in the groin.   They may leave with walking around or changing positions while lying down.   They get weaker and are shorter lasting as time goes on.   These contractions are usually irregular.   They do not usually become progressively stronger, regular and closer together as with true labor.   True labor.   These contractions are difficulty to walk, talk or even breathe through. You won't be able to carry on a conversation very easily.   Contractions in true labor last 30 to 70 seconds, become very regular, usually become more intense, and increase in frequency.   They do not go away with walking.   The discomfort is usually felt in the top of the uterus and spreads to the lower abdomen and low back.   True labor can be determined by your caregiver with an exam. This will show that the cervix is  dilating and getting thinner.  If there are no prenatal problems or other health problems associated with the pregnancy, it is completely safe to be sent home with false labor and await the onset of true labor.  HOME CARE INSTRUCTIONS   Keep up with your usual exercises and instructions.   Take medications as directed. You may take extra strength tylenol for discomfort, you may also take 50mg  benedryl to help you sleep.   Keep your regular prenatal appointment.   Eat and drink lightly if you think you are going into labor.   If contractions continue to make you uncomfortable:   Try to Change your activity position from lying down or resting to walking/walking to resting.   Sit and rest in a tub of warm water. Or shower  Drink 2 to at least 64oz of water/day. If you having a run of contractions, drink 2-3 large cold glasses of ice water.. Dehydration may cause ineffective contractions.   Do slow and deep breathing several times an hour.   Do not wear yourself out with lots of walking, walking will NOT cause you to be in labor.  SEEK IMMEDIATE MEDICAL CARE IF:   Your contractions continue to become stronger, more regular, and closer together.   You have a gushing, burst or leaking of fluid from the vagina.   An oral temperature above 102 F (38.9 C) develops.   You have passage of blood-tinged mucus.  You develop vaginal bleeding. - Spotting can be normal after a vaginal exam or having sex.   You develop continuous belly (abdominal) pain.   You have low back pain that you never had before.   You feel the baby's head pushing down causing pelvic pressure.   The baby is not moving as much as it used to.   Fetal Movement Counts Patient Name: __________________________________________________ Patient Due Date: ____________________ Monica [name] counts is highly recommended in high risk pregnancies, but it is a good idea for every pregnant woman to do. Start counting fetal movements at  28 weeks of the pregnancy. Fetal movements increase after eating a full meal or eating or drinking something sweet (the blood sugar is higher). It is also important to drink plenty of fluids (well hydrated) before doing the count. Lie on your left side because it helps with the circulation or you can sit in a comfortable chair with your arms over your belly (abdomen) with no distractions around you. DOING THE COUNT  Try to do the count the same time of day each time you do it.   Mark the day and time, then see how long it takes for you to feel 10 movements (kicks, flutters, swishes, rolls). You should have at least 10 movements within 2 hours. You will most likely feel 10 movements in much less than 2 hours. If you do not, wait an hour and count again. After a couple of days you will see a pattern.   What you are looking for is a change in the pattern or not enough counts in 2 hours. Is it taking longer in time to reach 10 movements?  SEEK MEDICAL CARE IF:  You feel less than 10 counts in 2 hours. Tried twice.   No movement in one hour.   The pattern is changing or taking longer each day to reach 10 counts in 2 hours.   You feel the baby is not moving as it usually does.  Date: ____________ Movements: ____________ Start time: ____________ Doreatha Martin time: ____________  Date: ____________ Movements: ____________ Start time: ____________ Doreatha Martin time: ____________ Date: ____________ Movements: ____________ Start time: ____________ Doreatha Martin time: ____________ Date: ____________ Movements: ____________ Start time: ____________ Doreatha Martin time: ____________ Date: ____________ Movements: ____________ Start time: ____________ Doreatha Martin time: ____________ Date: ____________ Movements: ____________ Start time: ____________ Doreatha Martin time: ____________ Date: ____________ Movements: ____________ Start time: ____________ Doreatha Martin time: ____________ Date: ____________ Movements: ____________ Start time: ____________ Doreatha Martin  time: ____________  Date: ____________ Movements: ____________ Start time: ____________ Doreatha Martin time: ____________ Date: ____________ Movements: ____________ Start time: ____________ Doreatha Martin time: ____________ Date: ____________ Movements: ____________ Start time: ____________ Doreatha Martin time: ____________ Date: ____________ Movements: ____________ Start time: ____________ Doreatha Martin time: ____________ Date: ____________ Movements: ____________ Start time: ____________ Doreatha Martin time: ____________ Date: ____________ Movements: ____________ Start time: ____________ Doreatha Martin time: ____________ Date: ____________ Movements: ____________ Start time: ____________ Doreatha Martin time: ____________  Date: ____________ Movements: ____________ Start time: ____________ Doreatha Martin time: ____________ Date: ____________ Movements: ____________ Start time: ____________ Doreatha Martin time: ____________ Date: ____________ Movements: ____________ Start time: ____________ Doreatha Martin time: ____________ Date: ____________ Movements: ____________ Start time: ____________ Doreatha Martin time: ____________ Date: ____________ Movements: ____________ Start time: ____________ Doreatha Martin time: ____________ Date: ____________ Movements: ____________ Start time: ____________ Doreatha Martin time: ____________ Date: ____________ Movements: ____________ Start time: ____________ Doreatha Martin time: ____________  Date: ____________ Movements: ____________ Start time: ____________ Doreatha Martin time: ____________ Date: ____________ Movements: ____________ Start time: ____________ Doreatha Martin time: ____________ Date: ____________ Movements: ____________ Start time: ____________ Doreatha Martin time: ____________ Date: ____________ Movements: ____________  Start time: ____________ Doreatha Martin time: ____________ Date: ____________ Movements: ____________ Start time: ____________ Doreatha Martin time: ____________ Date: ____________ Movements: ____________ Start time: ____________ Doreatha Martin time: ____________ Date: ____________  Movements: ____________ Start time: ____________ Doreatha Martin time: ____________  Date: ____________ Movements: ____________ Start time: ____________ Doreatha Martin time: ____________ Date: ____________ Movements: ____________ Start time: ____________ Doreatha Martin time: ____________ Date: ____________ Movements: ____________ Start time: ____________ Doreatha Martin time: ____________ Date: ____________ Movements: ____________ Start time: ____________ Doreatha Martin time: ____________ Date: ____________ Movements: ____________ Start time: ____________ Doreatha Martin time: ____________ Date: ____________ Movements: ____________ Start time: ____________ Doreatha Martin time: ____________ Date: ____________ Movements: ____________ Start time: ____________ Doreatha Martin time: ____________  Date: ____________ Movements: ____________ Start time: ____________ Doreatha Martin time: ____________ Date: ____________ Movements: ____________ Start time: ____________ Doreatha Martin time: ____________ Date: ____________ Movements: ____________ Start time: ____________ Doreatha Martin time: ____________ Date: ____________ Movements: ____________ Start time: ____________ Doreatha Martin time: ____________ Date: ____________ Movements: ____________ Start time: ____________ Doreatha Martin time: ____________ Date: ____________ Movements: ____________ Start time: ____________ Doreatha Martin time: ____________ Date: ____________ Movements: ____________ Start time: ____________ Doreatha Martin time: ____________  Date: ____________ Movements: ____________ Start time: ____________ Doreatha Martin time: ____________ Date: ____________ Movements: ____________ Start time: ____________ Doreatha Martin time: ____________ Date: ____________ Movements: ____________ Start time: ____________ Doreatha Martin time: ____________ Date: ____________ Movements: ____________ Start time: ____________ Doreatha Martin time: ____________ Date: ____________ Movements: ____________ Start time: ____________ Doreatha Martin time: ____________ Date: ____________ Movements: ____________ Start time:  ____________ Doreatha Martin time: ____________ Date: ____________ Movements: ____________ Start time: ____________ Doreatha Martin time: ____________  Date: ____________ Movements: ____________ Start time: ____________ Doreatha Martin time: ____________ Date: ____________ Movements: ____________ Start time: ____________ Doreatha Martin time: ____________ Date: ____________ Movements: ____________ Start time: ____________ Doreatha Martin time: ____________ Date: ____________ Movements: ____________ Start time: ____________ Doreatha Martin time: ____________ Date: ____________ Movements: ____________ Start time: ____________ Doreatha Martin time: ____________ Date: ____________ Movements: ____________ Start time: ____________ Doreatha Martin time: ____________ Document Released: 04/26/2006 Document Revised: 03/16/2011 Document Reviewed: 10/27/2008 [employer] Patient Information 2012 [employer]

## 2011-06-15 NOTE — Progress Notes (Signed)
"  I've been having UC's about every 10-15 mins and last about 45 secs since last night.  I called the midwife today about 1730, because I was waiting to see if they weren't Braxton-Hicks UC's.  No bleeding or leaking any fluid from vagina.  (+) FM."

## 2011-06-15 NOTE — Progress Notes (Signed)
Patient is scheduled for a repeat cesarean section on 4-5.

## 2011-06-16 ENCOUNTER — Ambulatory Visit (INDEPENDENT_AMBULATORY_CARE_PROVIDER_SITE_OTHER): Payer: Self-pay | Admitting: Licensed Clinical Social Worker

## 2011-06-16 ENCOUNTER — Encounter (HOSPITAL_COMMUNITY): Payer: Self-pay | Admitting: *Deleted

## 2011-06-16 ENCOUNTER — Encounter (HOSPITAL_COMMUNITY): Payer: Self-pay | Admitting: Licensed Clinical Social Worker

## 2011-06-16 ENCOUNTER — Emergency Department (HOSPITAL_COMMUNITY)
Admission: EM | Admit: 2011-06-16 | Discharge: 2011-06-17 | Disposition: A | Discharge status: Other Acute Inpatient Hospital | Payer: Managed Care, Other (non HMO) | Attending: Emergency Medicine | Admitting: Emergency Medicine

## 2011-06-16 DIAGNOSIS — Z87898 Personal history of other specified conditions: Secondary | ICD-10-CM

## 2011-06-16 DIAGNOSIS — Z87891 Personal history of nicotine dependence: Secondary | ICD-10-CM | POA: Insufficient documentation

## 2011-06-16 DIAGNOSIS — F332 Major depressive disorder, recurrent severe without psychotic features: Secondary | ICD-10-CM

## 2011-06-16 DIAGNOSIS — F4310 Post-traumatic stress disorder, unspecified: Secondary | ICD-10-CM

## 2011-06-16 DIAGNOSIS — Z9189 Other specified personal risk factors, not elsewhere classified: Secondary | ICD-10-CM | POA: Insufficient documentation

## 2011-06-16 DIAGNOSIS — Z8619 Personal history of other infectious and parasitic diseases: Secondary | ICD-10-CM

## 2011-06-16 DIAGNOSIS — Z3480 Encounter for supervision of other normal pregnancy, unspecified trimester: Secondary | ICD-10-CM | POA: Insufficient documentation

## 2011-06-16 DIAGNOSIS — Z62819 Personal history of unspecified abuse in childhood: Secondary | ICD-10-CM

## 2011-06-16 DIAGNOSIS — IMO0002 Reserved for concepts with insufficient information to code with codable children: Secondary | ICD-10-CM | POA: Insufficient documentation

## 2011-06-16 DIAGNOSIS — Z915 Personal history of self-harm: Secondary | ICD-10-CM

## 2011-06-16 DIAGNOSIS — O09299 Supervision of pregnancy with other poor reproductive or obstetric history, unspecified trimester: Secondary | ICD-10-CM | POA: Insufficient documentation

## 2011-06-16 DIAGNOSIS — Z8659 Personal history of other mental and behavioral disorders: Secondary | ICD-10-CM

## 2011-06-16 LAB — URINALYSIS, ROUTINE W REFLEX MICROSCOPIC
Bilirubin Urine: NEGATIVE
Glucose, UA: NEGATIVE mg/dL
Ketones, ur: 40 mg/dL — AB
Leukocytes, UA: NEGATIVE
pH: 6.5 (ref 5.0–8.0)

## 2011-06-16 LAB — POCT I-STAT, CHEM 8
BUN: <3 mg/dL — ABNORMAL LOW (ref 6–23)
Calcium, Ion: 1.21 mmol/L (ref 1.12–1.32)
Glucose, Bld: 86 mg/dL (ref 70–99)
TCO2: 19 mmol/L (ref 0–100)

## 2011-06-16 LAB — RAPID URINE DRUG SCREEN, HOSP PERFORMED
Barbiturates: NOT DETECTED
Benzodiazepines: NOT DETECTED
Cocaine: NOT DETECTED

## 2011-06-16 MED ORDER — ALUM & MAG HYDROXIDE-SIMETH 200-200-20 MG/5ML PO SUSP
30.0000 mL | Freq: Once | ORAL | Status: AC
  Administered 2011-06-16: 30 mL via ORAL
  Filled 2011-06-16: qty 30

## 2011-06-16 NOTE — ED Notes (Signed)
Attempted to contact [facility] Nurse- with no response. Will continue to attempt to call.
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
## 2011-06-16 NOTE — ED Notes (Signed)
2 bags with pt belongings and pair of shoes placed in locker #37

## 2011-06-16 NOTE — Progress Notes (Signed)
Phoned [facility] ED RN and notified her that EFM could be removed per order from Dr. Estanislado Pandy.

## 2011-06-16 NOTE — ED Notes (Signed)
Pt given

## 2011-06-16 NOTE — ED Notes (Addendum)
Pt reports sent from therapist at [facility] due to suicidal thoughts. Denies plan. Pt is also [redacted] weeks pregnant. Saw OB Monday and everything "okay" with baby. Reports has been having contractions and dilated 1 cm as of Monday.
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
## 2011-06-16 NOTE — ED Notes (Signed)
Pt. ate 100% of meal: sandwich and fruit cup. Requested another snack. Snack given.

## 2011-06-16 NOTE — ED Notes (Signed)
1 bag of belongings: 1 purse, 1 gray coat, 1 scarf, 1 long sleeve shirt, 1 tank top, 1 pair of jeans.

## 2011-06-16 NOTE — ED Notes (Signed)
Received phone call from rapid response ob nurse, stating it is ok to remove the pt from fetal monitoring and to make her aware when pt is moved.

## 2011-06-16 NOTE — Progress Notes (Signed)
Arrived at bedside. Pt has flat affect. EFM already applied to pt. Pt states EDC 07/21/11 and scheduled for repeat c/section 07/14/10 by Dr. Pennie Rushing.

## 2011-06-16 NOTE — Progress Notes (Signed)
Dr. Estanislado Pandy notified of pt arrival to [facility] ED. Notified of pt c/o of suicidal ideations with c/o of contractions. EFM noted that FHR 130 reactive with moderate variability, no decels and one contraction noted on monitor since placement. Dr. Estanislado Pandy stated that if patient admitted she would order daily NSTs on pt. Will keep her posted. Order for pt to be on the monitor for at least one hour for monitoring FHR/ UCs.

## 2011-06-16 NOTE — ED Notes (Signed)
Monica [name] has called back after speaking to Monica [name], [facility].  Instructed to treat pt as protocol for suicide, [facility] on the wait list for OB rapid response RN to see her.  Pt will also be sent a sitter when possible.
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
## 2011-06-16 NOTE — ED Notes (Signed)
Monica [name], [facility] called to inform about not being able to get a hold of the rapid response RN.  [facility] and will advise.

## 2011-06-16 NOTE — Progress Notes (Signed)
Received call from ED RN regarding pt who arrived with suicidal ideations and c/o of contractions. Asked RN to place pt on EFM

## 2011-06-16 NOTE — ED Notes (Signed)
Pt and belongings have been wanded by security. Pt has 2 belonging bags

## 2011-06-16 NOTE — ED Notes (Signed)
Ob rapid response nurse at bedside  

## 2011-06-16 NOTE — ED Notes (Signed)
Joni Reining, rapid response RN has been made aware of this pt.  She is currently w/a preg trama pt and will be coming over as soon as she can.  Instructions given to place pt on the monitor if possible and continue to monitor.
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
## 2011-06-17 ENCOUNTER — Encounter (HOSPITAL_COMMUNITY): Payer: Self-pay | Admitting: *Deleted

## 2011-06-17 ENCOUNTER — Inpatient Hospital Stay (HOSPITAL_COMMUNITY)
Admission: RE | Admit: 2011-06-17 | Discharge: 2011-06-23 | DRG: 781 | Disposition: A | Discharge status: Home / Self Care | Payer: Managed Care, Other (non HMO) | Source: Ambulatory Visit | Attending: Psychiatry | Admitting: Psychiatry

## 2011-06-17 DIAGNOSIS — R45850 Homicidal ideations: Secondary | ICD-10-CM

## 2011-06-17 DIAGNOSIS — F1210 Cannabis abuse, uncomplicated: Secondary | ICD-10-CM | POA: Diagnosis present

## 2011-06-17 DIAGNOSIS — F329 Major depressive disorder, single episode, unspecified: Secondary | ICD-10-CM | POA: Diagnosis present

## 2011-06-17 DIAGNOSIS — F3160 Bipolar disorder, current episode mixed, unspecified: Secondary | ICD-10-CM

## 2011-06-17 DIAGNOSIS — F411 Generalized anxiety disorder: Secondary | ICD-10-CM

## 2011-06-17 DIAGNOSIS — IMO0002 Reserved for concepts with insufficient information to code with codable children: Secondary | ICD-10-CM

## 2011-06-17 DIAGNOSIS — O99340 Other mental disorders complicating pregnancy, unspecified trimester: Principal | ICD-10-CM

## 2011-06-17 DIAGNOSIS — R45851 Suicidal ideations: Secondary | ICD-10-CM

## 2011-06-17 DIAGNOSIS — F4310 Post-traumatic stress disorder, unspecified: Secondary | ICD-10-CM

## 2011-06-17 DIAGNOSIS — Z818 Family history of other mental and behavioral disorders: Secondary | ICD-10-CM

## 2011-06-17 DIAGNOSIS — Z79899 Other long term (current) drug therapy: Secondary | ICD-10-CM

## 2011-06-17 MED ORDER — PRENATAL MULTIVITAMIN CH
1.0000 | ORAL_TABLET | Freq: Every day | ORAL | Status: DC
  Administered 2011-06-18 – 2011-06-23 (×6): 1 via ORAL
  Filled 2011-06-17 (×5): qty 1
  Filled 2011-06-17: qty 7
  Filled 2011-06-17 (×2): qty 1

## 2011-06-17 MED ORDER — ALUM & MAG HYDROXIDE-SIMETH 200-200-20 MG/5ML PO SUSP
15.0000 mL | Freq: Once | ORAL | Status: AC
  Administered 2011-06-17: 15 mL via ORAL
  Filled 2011-06-17: qty 30

## 2011-06-17 MED ORDER — ACETAMINOPHEN 325 MG PO TABS
650.0000 mg | ORAL_TABLET | Freq: Four times a day (QID) | ORAL | Status: DC | PRN
  Administered 2011-06-18: 650 mg via ORAL

## 2011-06-17 MED ORDER — DIPHENHYDRAMINE HCL 25 MG PO CAPS
25.0000 mg | ORAL_CAPSULE | Freq: Every evening | ORAL | Status: DC | PRN
  Administered 2011-06-19 – 2011-06-20 (×2): 25 mg via ORAL

## 2011-06-17 NOTE — ED Provider Notes (Addendum)
Pt alert, content. Nad. telepsych pending.   Suzi Roots, MD 06/17/11 1024    telepsych recommends inpt tx. Discussed w ACT team, they are working on placement ?to [facility].  Suzi Roots, MD 06/17/11 [phone]

## 2011-06-17 NOTE — Consult Note (Signed)
Dr. Henrene Hawking at [facility] requests a OBGYN consult occur with pt to determine where pt is in relations to delivery and what her health needs may be at [facility].  MD will order consult if in agreement and eval will be forwarded to [facility] at [phone] fax or through [facility]

## 2011-06-17 NOTE — BHH Counselor (Signed)
Pt accepted to [facility] by Dr. Henrene Hawking to Dr. Dan Humphreys 500-2 and will have a private room.  Pt has signed paper work and will go by security

## 2011-06-17 NOTE — ED Notes (Signed)
telepsych completed 

## 2011-06-17 NOTE — ED Provider Notes (Signed)
Evaluated the patient. No abd pain at this time. See previous OB nurse notes. Cleared by OB. Needs daily NST. D/W Dr. Henrene Hawking who accepted patient to [facility].   Forbes Cellar, MD 06/17/11 1746

## 2011-06-18 ENCOUNTER — Other Ambulatory Visit (HOSPITAL_COMMUNITY): Payer: Self-pay | Admitting: Obstetrics and Gynecology

## 2011-06-18 DIAGNOSIS — F1210 Cannabis abuse, uncomplicated: Secondary | ICD-10-CM | POA: Diagnosis present

## 2011-06-18 DIAGNOSIS — F329 Major depressive disorder, single episode, unspecified: Secondary | ICD-10-CM | POA: Diagnosis present

## 2011-06-18 MED ORDER — ESCITALOPRAM OXALATE 10 MG PO TABS
10.0000 mg | ORAL_TABLET | Freq: Every day | ORAL | Status: DC
  Administered 2011-06-18 – 2011-06-23 (×6): 10 mg via ORAL
  Filled 2011-06-18: qty 7
  Filled 2011-06-18 (×9): qty 1

## 2011-06-18 MED ORDER — CALCIUM CARBONATE ANTACID 500 MG PO CHEW
2.0000 | CHEWABLE_TABLET | Freq: Four times a day (QID) | ORAL | Status: DC | PRN
  Administered 2011-06-18 – 2011-06-19 (×2): 400 mg via ORAL
  Filled 2011-06-18: qty 2

## 2011-06-18 NOTE — BHH Suicide Risk Assessment (Signed)
Suicide Risk Assessment  Admission Assessment     Demographic factors:  Assessment Details Time of Assessment: Admission Information Obtained From: Patient Current Mental Status:  Current Mental Status: Suicidal ideation indicated by patient Loss Factors:  Loss Factors: Loss of significant relationship;Legal issues Historical Factors:  Historical Factors: Prior suicide attempts Risk Reduction Factors:  Risk Reduction Factors: Pregnancy  CLINICAL FACTORS:   Depression:   Anhedonia Hopelessness Insomnia 35 weeks of gestation  COGNITIVE FEATURES THAT CONTRIBUTE TO RISK:  Closed-mindedness Polarized thinking    SUICIDE RISK:  Moderate:  Frequent suicidal ideation with limited intensity, and duration, some specificity in terms of plans, no associated intent, good self-control, limited dysphoria/symptomatology, some risk factors present, and identifiable protective factors, including available and accessible social support.  PLAN OF CARE: Admitted to locked psychiatric in patient unit for safety and secure therapeutic milieu. Will discuss risks of benefits of SSRI during pregnancy and may start with consent.    Yi Haugan,JANARDHAHA R. 06/18/2011, 3:46 PM
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
## 2011-06-18 NOTE — Tx Team (Signed)
Initial Interdisciplinary Treatment Plan  PATIENT STRENGTHS: (choose at least two) Ability for insight Average or above average intelligence Communication skills General fund of knowledge Motivation for treatment/growth  PATIENT STRESSORS: Health problems Legal issue Marital or family conflict   PROBLEM LIST: Problem List/Patient Goals Date to be addressed Date deferred Reason deferred Estimated date of resolution  Depression with suicidal thoughts 06/17/11                                                      DISCHARGE CRITERIA:  Ability to meet basic life and health needs Improved stabilization in mood, thinking, and/or behavior Need for constant or close observation no longer present Safe-care adequate arrangements made  PRELIMINARY DISCHARGE PLAN: Outpatient therapy Return to previous living arrangement  PATIENT/FAMIILY INVOLVEMENT: This treatment plan has been presented to and reviewed with the patient, [place], [place] 06/18/2011, 6:20 AM

## 2011-06-18 NOTE — Progress Notes (Signed)
[facility] Group Notes:  (Counselor/Nursing/MHT/Case Management/Adjunct)  06/18/2011 5:39 PM  Type of Therapy:  Group Therapy  Participation Level:  Active  Participation Quality:  Appropriate  Affect:  Appropriate  Cognitive:  Appropriate  Insight:  Good  Engagement in Group:  Good  Engagement in Therapy:  Good  Modes of Intervention:  Clarification, Problem-solving, Socialization and Support  Summary of Progress/Problems: Pt. participated in group session on supports, and identified who their supports are in their lives and how they support them. Each pt. Spoke about identifying was to support themselves when their supports are not available. Each pt. participated in activity on how support feels verses just saying you have support. The pt. did not sleep well last night. Pt. Talked about the lack of emotional support from her family and how they always try to do things for her instead of supporting her emotionally. Pt. stated that her husband is unable to support her due to not knowing how. [name] 06/18/2011, 5:39 PM
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
## 2011-06-18 NOTE — Progress Notes (Signed)
RROB to doppler pt; fht's 140's; accels to 170's, positive fetal movement, pt denies any bleeding, leaking of fluid, or contractions.

## 2011-06-18 NOTE — Progress Notes (Signed)
Doppler in 130's with accels to 170.  Denies any contractions, leaking, discharge or bleeding.  Reports positive fetal movements and denies any HA or VD.
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
## 2011-06-19 MED ORDER — BISACODYL 5 MG PO TBEC
5.0000 mg | DELAYED_RELEASE_TABLET | Freq: Every day | ORAL | Status: DC
  Filled 2011-06-19 (×4): qty 1

## 2011-06-19 MED ORDER — ZOLPIDEM TARTRATE 5 MG PO TABS
5.0000 mg | ORAL_TABLET | Freq: Every evening | ORAL | Status: DC | PRN
  Administered 2011-06-19 – 2011-06-22 (×7): 5 mg via ORAL
  Filled 2011-06-19 (×7): qty 1
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
## 2011-06-19 NOTE — Tx Team (Signed)
Interdisciplinary Treatment Plan Update (Adult)  Date:  06/19/2011  Time Reviewed:  11:55 AM   Progress in Treatment: Attending groups: Yes Participating in groups:  Yes Taking medication as prescribed: Yes Tolerating medication:  Yes Family/Significant othe contact made:  Counselor assessing for appropriate contact Patient understands diagnosis:  Yes Discussing patient identified problems/goals with staff:  Yes Medical problems stabilized or resolved:  Yes Denies suicidal/homicidal ideation: Yes Issues/concerns per patient self-inventory:  None identified Other: N/A  New problem(s) identified: None Identified  Reason for Continuation of Hospitalization: Anxiety Depression Medication stabilization Suicidal ideation  Interventions implemented related to continuation of hospitalization: mood stabilization, medication monitoring and adjustment, group therapy and psycho education, safety checks q 15 mins  Additional comments: N/A  Estimated length of stay: 3-5 days  Discharge Plan: SW assessing for appropriate referrals  New goal(s): N/A  Review of initial/current patient goals per problem list:    1.  Goal(s): Reduce depressive symptoms  Met:  No  Target date: by discharge  As evidenced by: Reducing depression from a 10 to a 3 as reported by pt. Pt ranks at a 7 today.  2.  Goal (s): Reduce/Eliminate suicidal ideation  Met:  No  Target date: by discharge  As evidenced by: pt reporting no SI.    3.  Goal(s): Reduce anxiety symptoms  Met:  No  Target date: by discharge  As evidenced by: Reduce anxiety from a 10 to a 3 as reported by pt. Pt ranks at a 10 today.    Attendees: Patient:  Monica [name] 06/19/2011 11:56 AM   Family:     Physician:  Orson Aloe, MD  06/19/2011  11:55 AM   Nursing:   [name] Reichert, RN 06/19/2011 11:56 AM   Case Manager:  Reyes Ivan, LCSWA 06/19/2011  11:55 AM   Counselor:  Angus Palms, LCSW 06/19/2011  11:55 AM   Other:   Juline Patch, LCSW 06/19/2011  11:55 AM   Other:  Carolynn Comment, RN 06/19/2011  11:55 AM   Other:  Charlyne Mom, counseling intern 06/19/2011 11:57 AM   Other:      Scribe for Treatment Team:   Carmina Miller, 06/19/2011 , 11:55 AM

## 2011-06-19 NOTE — Progress Notes (Signed)
Patient ID: Monica [name], female   DOB: [birth year], 31 y.o.   MRN: [id number] Pt. denies lethality and A/V/H's: Pt. Is cooperative and pleasant with staff and peers. C/o racing thoughts; feels nauseaous ("prenatal vitamins") craves "dirt, but I eat corn starch instead-I crave it now!"   Pt. Feels generally "sick, weak, tired--not as bad as earlier in the day today."

## 2011-06-19 NOTE — Progress Notes (Signed)
Patient ID: Monica [name], female   DOB: [birth year], 31 y.o.   MRN: [id number] The patient has a flat affect and depressed mood. She attended and actively participated in group. Denies any suicidal/homicidal ideation at this time. Is able to contract for safety. States that she is having difficulty sleeping due to feeling uncomfortable due to being pregnant. Also c/o frequent bouts of indigestion.

## 2011-06-19 NOTE — Progress Notes (Signed)
Arrived to [facility] at [phone].  Pt attending group therapy.  Awaited Group therapy ending.  Pt resting comfortably in room at 0920.  Dop tones x 2 min.  FHR 145 with acceleration to 160's noted.  No decelerations noted.  Pt's abdomen soft and nontender upon palpation.  Pt states normal fetal movement and denies LOF or vaginal bleeding.Per pt request, contacted CNM Terald Sleeper regarding Ambien order request.  Updated CNM Kriebsbach re doppler tones and pt assessment and request for Ambien.  Order for Ambien obtained.  Spoke with Dr Dan Humphreys to let him know that the order has been given and entered in Epic.

## 2011-06-19 NOTE — Progress Notes (Signed)
[facility] Group Notes:  (Counselor/Nursing/MHT/Case Management/Adjunct)  06/19/2011 11:53 AM  Type of Therapy:  Group Therapy  Participation Level:  Did Not Attend     Monica [name] 06/19/2011, 11:53 AM

## 2011-06-19 NOTE — H&P (Signed)
Patient was evaluated along with the Mrs. Scott and agree with the plan. Discussed risks and benefits of starting medication Lexapro and its possible affects on pregnancy and obtained consent.

## 2011-06-19 NOTE — [facility] Suicide Risk Assessment (Signed)
Suicide Risk Assessment  Admission Assessment     Demographic factors:  Assessment Details Time of Assessment: Admission Information Obtained From: Patient Current Mental Status:  Current Mental Status: Suicidal ideation indicated by patient Loss Factors:  Loss Factors: Loss of significant relationship;Legal issues Historical Factors:  Historical Factors: Prior suicide attempts Risk Reduction Factors:  Risk Reduction Factors: Pregnancy  CLINICAL FACTORS:   Severe Anxiety and/or Agitation Bipolar Disorder:   Bipolar II Previous Psychiatric Diagnoses and Treatments  COGNITIVE FEATURES THAT CONTRIBUTE TO RISK:  Thought constriction (tunnel vision)    SUICIDE RISK:   Moderate:  Frequent suicidal ideation with limited intensity, and duration, some specificity in terms of plans, no associated intent, good self-control, limited dysphoria/symptomatology, some risk factors present, and identifiable protective factors, including available and accessible social support.  Reason for hospitalization: .Suicidal thoughts while pregnant again.  Diagnosis:  Axis I: Bipolar, Depressed  ADL's:  Intact  Sleep: Poor  Appetite:  Fair  Suicidal Ideation:  Pt denies any suicidal thoughts now, Homicidal Ideation:  Denies adamantly any homicidal thoughts.  Mental Status Examination/Evaluation: Objective:  Appearance: Casual  Eye Contact::  Good  Speech:  Clear and Coherent  Volume:  Normal  Mood:  Anxious and depressed  Affect:  Congruent  Thought Process:  Coherent  Orientation:  Full  Thought Content:  WDL  Suicidal Thoughts:  No  Homicidal Thoughts:  No  Memory:  Immediate;   Good  Judgement:  Fair  Insight:  Fair  Psychomotor Activity:  Normal  Concentration:  Fair  Recall:  Fair  Akathisia:  No  AIMS (if indicated):     Assets:  Communication Skills Desire for Improvement Financial Resources/Insurance Intimacy Leisure Time Physical Health Social Support  Sleep:        Vital Signs: Blood pressure 113/73, pulse 94, temperature 98.4 F (36.9 C), temperature source Oral, resp. rate 20, height 5' 6.5" (1.689 m), weight 88.451 kg (195 lb), last menstrual period 09/29/2010. Current Medications:  Current Facility-Administered Medications  Medication Dose Route Frequency Provider Last Rate Last Dose  . acetaminophen (TYLENOL) tablet 650 mg  650 mg Oral Q6H PRN Viviann Spare, NP   650 mg at 06/18/11 0837  . calcium carbonate (TUMS - dosed in mg elemental calcium) chewable tablet 400 mg of elemental calcium  2 tablet Oral QID PRN Viviann Spare, NP   400 mg of elemental calcium at 06/19/11 1659  . diphenhydrAMINE (BENADRYL) capsule 25 mg  25 mg Oral QHS PRN Viviann Spare, NP   25 mg at 06/19/11 0005  . escitalopram (LEXAPRO) tablet 10 mg  10 mg Oral Daily Viviann Spare, NP   10 mg at 06/19/11 5621  . prenatal multivitamin tablet 1 tablet  1 tablet Oral Daily Viviann Spare, NP   1 tablet at 06/19/11 3086  . zolpidem (AMBIEN) tablet 5 mg  5 mg Oral QHS PRN,MR X 1 Lavera Guise, CNM        Lab Results: No results found for this or any previous visit (from the past 48 hour(s)). Physical Findings: AIMS:   CIWA:     COWS:      Treatment Plan Summary: Daily contact with patient to assess and evaluate symptoms and progress in treatment Medication management  Risk of harm to self is elevated by her diagnosis of depression  Risk of harm to others is minimal in that she has not been involved in fights or had any legal charges filed on her.  Plan: We  will admit the patient for crisis stabilization and treatment. I talked to pt about starting Risperdal because she seems to have bipolar disorder.  She has 2 uncles, 1 aunt and 1 cousin with that diagnosis and her mood disturbances have all been in the fall and the spring.  Will have to check with her OB about starting Risperdal at this time. I explained the risks and benefits of medication in detail.  We  will continue on q. 15 checks the unit protocol. At this time there is no clinical indication for one-to-one observation as patient contract for safety and presents little risk to harm themself and others.  We will increase collateral information. I encourage patient to participate in group milieu therapy. Pt will be seen in treatment team meeting tomorrow morning for further treatment and appropriate discharge planning. Please see history and physical note for more detailed information ELOS: 3 to 5 days.   Zoi Devine 06/19/2011, 5:53 PM

## 2011-06-20 DIAGNOSIS — F329 Major depressive disorder, single episode, unspecified: Secondary | ICD-10-CM

## 2011-06-20 MED ORDER — DOCUSATE SODIUM 100 MG PO CAPS
100.0000 mg | ORAL_CAPSULE | Freq: Every day | ORAL | Status: DC
  Administered 2011-06-20 – 2011-06-23 (×4): 100 mg via ORAL
  Filled 2011-06-20 (×4): qty 1
  Filled 2011-06-20: qty 7
  Filled 2011-06-20 (×2): qty 1

## 2011-06-20 NOTE — Progress Notes (Signed)
Pt states she slept fair, appetite is improving. "Energy low, focus poor". Pt rates her depression and hopelessness as a 6. Pt states she still has cravings/agitation. Pt denies SI/HI. Pt c/o constipation, Colace started per MD order. Pt going to groups, affect brightens on approach. 15 minute checks for safety. Safety maintained. Pt has no c/o vaginal cramping, bleeding, or leaking of fluid.

## 2011-06-20 NOTE — Progress Notes (Signed)
Manfred Arch, CNM reviewed pts NST and signed it as Reactive. CNM also notified of pt request of medication for Risperdal and CNM stated that the [facility] team need to have a consult with the OB to manage medications.

## 2011-06-20 NOTE — Progress Notes (Signed)
[facility] MD Progress Note  06/20/2011 1:36 PM  Diagnosis:  Axis I: Bipolar, mixed  ADL's:  Intact  Sleep: Good  Appetite:  Fair  Suicidal Ideation:  Denies adamantly any suicidal thoughts. Homicidal Ideation:  Denies adamantly any homicidal thoughts.  Mental Status Examination/Evaluation: Objective:  Appearance: Casual  Eye Contact::  Good  Speech:  Clear and Coherent  Volume:  Normal  Mood:  6 /10 on a scale of 1 is the best and 10 is the worst  Anxiety:  8/10 on the same scale  Affect:  Congruent  Thought Process:  Coherent  Orientation:  Full  Thought Content:  WDL  Suicidal Thoughts:  No  Homicidal Thoughts:  No  Memory:  Immediate;   Good  Judgement:  Fair  Insight:  Fair  Psychomotor Activity:  Normal  Concentration:  Fair  Recall:  Fair  Akathisia:  No  AIMS (if indicated):     Assets:  Communication Skills Desire for Improvement Financial Resources/Insurance Housing Intimacy Leisure Time Physical Health Social Support  Sleep:  Number of Hours: 6.25    Vital Signs:Blood pressure 119/82, pulse 106, temperature 97.9 F (36.6 C), temperature source Oral, resp. rate 16, height 5' 6.5" (1.689 m), weight 88.451 kg (195 lb), last menstrual period 09/29/2010. Current Medications: Current Facility-Administered Medications  Medication Dose Route Frequency Provider Last Rate Last Dose  . acetaminophen (TYLENOL) tablet 650 mg  650 mg Oral Q6H PRN Viviann Spare, NP   650 mg at 06/18/11 0837  . calcium carbonate (TUMS - dosed in mg elemental calcium) chewable tablet 400 mg of elemental calcium  2 tablet Oral QID PRN Viviann Spare, NP   400 mg of elemental calcium at 06/19/11 1659  . diphenhydrAMINE (BENADRYL) capsule 25 mg  25 mg Oral QHS PRN Viviann Spare, NP   25 mg at 06/19/11 0005  . docusate sodium (COLACE) capsule 100 mg  100 mg Oral Daily Naima A Dillard, MD   100 mg at 06/20/11 1132  . escitalopram (LEXAPRO) tablet 10 mg  10 mg Oral Daily Viviann Spare,  NP   10 mg at 06/20/11 0814  . prenatal multivitamin tablet 1 tablet  1 tablet Oral Daily Viviann Spare, NP   1 tablet at 06/20/11 0814  . zolpidem (AMBIEN) tablet 5 mg  5 mg Oral QHS PRN,MR X 1 Lavera Guise, CNM   5 mg at 06/19/11 2255  . DISCONTD: bisacodyl (DULCOLAX) EC tablet 5 mg  5 mg Oral QHS Sanjuana Kava, NP        Lab Results: No results found for this or any previous visit (from the past 48 hour(s)).  Physical Findings: AIMS:  , ,  ,  ,    CIWA:    COWS:     Treatment Plan Summary: Daily contact with patient to assess and evaluate symptoms and progress in treatment Medication management  Plan: Will consult with her OB about starting Risperdal.  She was stopped from the Ducolax and changed to Colace which did work.   Anthoney Sheppard 06/20/2011, 1:36 PM
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
## 2011-06-20 NOTE — Progress Notes (Signed)
Patient ID: Monica [name], female   DOB: [birth year], 31 y.o.   MRN: [id number] Patient was pleasant and cooperative during the assessment. Pt discussed start of risperdal and effects on her unborn child. Was rational and logical. Support and encouragement was offered.

## 2011-06-20 NOTE — Progress Notes (Signed)
Pt did not attend d/c planning group on this date.  SW met with pt individually at this time.  Pt was in the bed resting.  Pt states that she is sleepy and finally was able to rest last night.  Pt ranks depression at a 6 and anxiety at a 7 today.  Pt denies SI.  Pt will follow up with [facility] for medication management and therapy.  SW also contacted pt's supervisor and left a voicemail message that pt is hospitalized, per pt request.  No further needs voiced by pt at this time.   Reyes Ivan, LCSWA 06/20/2011  10:13 AM

## 2011-06-20 NOTE — Progress Notes (Signed)
Patient ID: Monica [name], female   DOB: 05/07/1980, 31 y.o.   MRN: [id number]  Patient was pleasant and cooperative during the assessment. Complained of insomnia and constipation. "I feel worse now than I did when I came in here. If I don't get enough sleep, nothing else seems to go right." Stated that constipation is the reason she doesn't take her prenatal vitamins. Spoke to the pt about importance of her meds, and informed that writer would get an order to help. Support and encouragement was offered.

## 2011-06-20 NOTE — Progress Notes (Signed)
Grief and Loss Group  Facilitated grief and loss group on 500 hall. Discussed group rules/norms and reviewed privacy. Discussed types of loss and grief reactions. Group was large and mostly quiet, required facilitator to used silence often, but members did share as group progressed. Members were supportive of one another and provided universality/validation. Main topic of grief/loss in current group was loss of children and loss of childhood.  Pt shared that she was struggling w/ loss of unborn twins and recent divorce. Pt stated she was trying to deal w/ loss while also dealing w/ being homeless and trying to take care of 18y/o daughter w/ special needs. Pt stated that she finds comfort in attending support groups in community and learning from others. Pt related to other members and provided insight/support.  Hamed Debella B MS, LPCA, NCC

## 2011-06-20 NOTE — Progress Notes (Signed)
[facility] applied for ordered NST q day per Dr. Redmond Baseman order. Pt denies contractions, abd pain, vaginal bleeding or leaking of fluid. Pt reports positive FM. Pt states that she has arranged for adoption of this infant with Dr. Pennie Rushing and the [facility] Social worker. Pt asked about the MDs at [facility] would like to start her on Risperdol. Stated that RN would speak with Dr. Normand Sloop today regarding medications.

## 2011-06-20 NOTE — Progress Notes (Signed)
Reviewed NST from Rapid Response Nurse evaluation at [facility]. NST reactive, no significant contractions. Per consult with Dr. Normand Sloop, plan NST daily. [facility] will consult with Dr. Normand Sloop regarding medication planning. Patient reported to RRN she wants a BTL with her C/S. Will inform Dr. Pennie Rushing (scheduled surgeon) of patient's request.  Nigel Bridgeman, CNM, [place] 06/20/11 2pm

## 2011-06-21 DIAGNOSIS — F339 Major depressive disorder, recurrent, unspecified: Secondary | ICD-10-CM

## 2011-06-21 MED ORDER — RISPERIDONE 0.25 MG PO TABS
0.2500 mg | ORAL_TABLET | Freq: Two times a day (BID) | ORAL | Status: DC
  Administered 2011-06-21 – 2011-06-22 (×4): 0.25 mg via ORAL
  Filled 2011-06-21 (×8): qty 1

## 2011-06-21 MED ORDER — RISPERIDONE 0.5 MG PO TABS
0.5000 mg | ORAL_TABLET | Freq: Every day | ORAL | Status: DC
  Administered 2011-06-21: 0.5 mg via ORAL
  Filled 2011-06-21 (×3): qty 1

## 2011-06-21 NOTE — Progress Notes (Signed)
Reviewed NST from rapid response nurse [facility] 150 with mod variability, 10x10 accels, no decels toco quiet Per RRN, baby very active and audible FM Will plan to repeat NST tonight Pt has appt at [facility] on Monday Will reschedule if pt not DC'd from [facility] Will consult with Dr Estanislado Pandy

## 2011-06-21 NOTE — Progress Notes (Signed)
Recreation Therapy Notes  06/21/2011         Time: 1415      Group Topic/Focus: The focus of this group is on enhancing patients' problem solving skills, which involves identifying the problem, brainstorming solutions and choosing and trying a solution.   Participation Level: Active  Participation Quality: Appropriate and Supportive  Affect: Blunted  Cognitive: Oriented   Additional Comments: Patient visibly tired, but appropriate throughout group. Patient able to provide appropriate feedback to a female peer.   [name] 06/21/2011 3:08 PM

## 2011-06-21 NOTE — Progress Notes (Signed)
SW met with pt individually at this time.  Pt was resting in the bed.  Pt presents with flat affect and depressed mood.  Pt states that she is feeling a little better today.  Pt states that the sleep medication is helping her and is helping her sleep well at night.  Pt states that she continues to feel agitated with noise on the unit.  Pt states that she hasn't been started on a medication for that and is waiting for the dr to talk to her obgyn.  Pt ranks depression at a 6 and anxiety at a 7-8.  Pt denies SI.  Pt will follow up at [facility] for medication management and therapy.  No further needs voiced at this time.    Reyes Ivan, LCSWA 06/21/2011  10:43 AM

## 2011-06-21 NOTE — Progress Notes (Signed)
Pt has been isolative to room much of the day today, pt unable to participate in various activities, pt tired much of the day today, pt has received medications without incident, support provided, will continue to monitor

## 2011-06-21 NOTE — Progress Notes (Signed)
[facility] Group Notes:  (Counselor/Nursing/MHT/Case Management/Adjunct)  06/21/2011 11:53 AM  Type of Therapy:  Group Therapy  Participation Level:  Active  Participation Quality:  Appropriate  Affect:  Appropriate  Cognitive:  Appropriate  Insight:  Good  Engagement in Group:  Good  Engagement in Therapy:  Good  Modes of Intervention:  Support  Summary of Progress/Problems:Patient participated actively in group, sharing her own insights, especially in response to an activity wherein participants observed their thoughts for one minute, reporting that she observed thoughts pertaining to childhood traumas that caused her emotions to vascillate between sad and angry.  Mendleson, Monica [name] 06/21/2011, 11:53 AM

## 2011-06-21 NOTE — Progress Notes (Signed)
[facility] MD Progress Note  06/21/2011 2:03 PM  Assessed patient during rounds. She was lying in bed with the ultrasound machine running with OB nurse present. Patient denies any new problems. However, reports that Risperdal is making her sleepy right now. Affirms that her baby is moving well"  Diagnosis:   Axis I: Major depressive disorder, recurrent episode  Axis II: Deferred Axis III:  Past Medical History  Diagnosis Date  . No pertinent past medical history   . PTSD (post-traumatic stress disorder)   . Depression   . Anxiety    Axis IV: No changes Axis V: 65  ADL's:  Intact  Sleep: Good  Appetite:  Good  Suicidal Ideation:  Plan:  No Intent:  No Means:  No Homicidal Ideation:  Plan:  No Intent:  No Means:  No  AEB (as evidenced by): Per patient's report  Mental Status Examination/Evaluation: Objective:  Appearance: Casual  Eye Contact::  Good  Speech:  Clear and Coherent  Volume:  Normal  Mood:  Euthymic  Affect:  Appropriate  Thought Process:  Coherent  Orientation:  Full  Thought Content:  Rumination  Suicidal Thoughts:  No  Homicidal Thoughts:  No  Memory:  Immediate;   Good Recent;   Good Remote;   Good  Judgement:  Fair  Insight:  Good  Psychomotor Activity:  Anxious  Concentration:  Good  Recall:  Good  Akathisia:  No  Handed:  Right  AIMS (if indicated):     Assets:  Desire for Improvement  Sleep:  Number of Hours: 6.25    Vital Signs:Blood pressure 128/96, pulse 111, temperature 98.3 F (36.8 C), temperature source Oral, resp. rate 18, height 5' 6.5" (1.689 m), weight 88.451 kg (195 lb), last menstrual period 09/29/2010. Current Medications: Current Facility-Administered Medications  Medication Dose Route Frequency Provider Last Rate Last Dose  . acetaminophen (TYLENOL) tablet 650 mg  650 mg Oral Q6H PRN Viviann Spare, NP   650 mg at 06/18/11 0837  . calcium carbonate (TUMS - dosed in mg elemental calcium) chewable tablet 400 mg of  elemental calcium  2 tablet Oral QID PRN Viviann Spare, NP   400 mg of elemental calcium at 06/19/11 1659  . diphenhydrAMINE (BENADRYL) capsule 25 mg  25 mg Oral QHS PRN Viviann Spare, NP   25 mg at 06/20/11 2149  . docusate sodium (COLACE) capsule 100 mg  100 mg Oral Daily Naima A Dillard, MD   100 mg at 06/21/11 0833  . escitalopram (LEXAPRO) tablet 10 mg  10 mg Oral Daily Viviann Spare, NP   10 mg at 06/21/11 2440  . prenatal multivitamin tablet 1 tablet  1 tablet Oral Daily Viviann Spare, NP   1 tablet at 06/21/11 [phone]  . risperiDONE (RISPERDAL) tablet 0.25 mg  0.25 mg Oral BID Mike Craze, MD   0.25 mg at 06/21/11 1102  . risperiDONE (RISPERDAL) tablet 0.5 mg  0.5 mg Oral QHS Mike Craze, MD      . zolpidem [facility]) tablet 5 mg  5 mg Oral QHS PRN,MR X 1 Lavera Guise, CNM   5 mg at 06/20/11 2231    Lab Results: No results found for this or any previous visit (from the past 48 hour(s)).  Physical Findings: AIMS:  , ,  ,  ,    CIWA:    COWS:     Treatment Plan Summary: Daily contact with patient to assess and evaluate symptoms and progress in treatment  Medication management  Plan: Encouraged to give body time to adjust to Risperdal.           Continue current treatment plan.  [name] I 06/21/2011, 2:03 PM

## 2011-06-21 NOTE — Progress Notes (Signed)
[facility] Group Notes: (Counselor/Nursing/MHT/Case Management/Adjunct) 06/21/2011   @1 :15pm  Type of Therapy:  Group Therapy  Participation Level:  Did Not Attend  Billie Lade 06/21/2011 2:14 PM

## 2011-06-22 MED ORDER — RISPERIDONE 0.25 MG PO TABS
0.2500 mg | ORAL_TABLET | Freq: Every day | ORAL | Status: DC
  Filled 2011-06-22 (×2): qty 1

## 2011-06-22 MED ORDER — RISPERIDONE 1 MG PO TABS
1.0000 mg | ORAL_TABLET | Freq: Every day | ORAL | Status: DC
  Administered 2011-06-22: 1 mg via ORAL
  Filled 2011-06-22 (×4): qty 1
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
## 2011-06-22 NOTE — Progress Notes (Signed)
Pt.  Denies SI/HI and denies A/V hallucinations and denies pain. Reports going home tomorrow.  Encouragement and support given.  Pt. Receptive.

## 2011-06-22 NOTE — Progress Notes (Signed)
Pt did not attend d/c planning group on this date.  SW met with pt individually at this time.  Pt was resting in the bed at this time.  Pt reports not doing well this morning and feeling more down then the past few days.  Pt states that she was having contractions last night and didn't sleep well.  Pt states that she started the new meds now but can't tell if they're working or not yet.  Pt asked when she can d/c, stating she wants to d/c soon.  Pt states that she has transportation upon d/c.  Pt ranks depression at a 7 and anxiety at an 8 today.  Pt denies SI.  Pt will follow up at [facility].  No further needs voiced by pt at this time.    Reyes Ivan, LCSWA 06/22/2011  9:17 AM

## 2011-06-22 NOTE — Progress Notes (Signed)
[facility] MD Progress Note  06/22/2011 10:27 PM  Diagnosis:  Axis I: Bipolar, mixed  ADL's:  Intact  Sleep: Fair due to having contractions last night.  Appetite:  Fair  Suicidal Ideation:  Denies adamantly any suicidal thoughts. Homicidal Ideation:  Denies adamantly any homicidal thoughts.  Mental Status Examination/Evaluation: Objective:  Appearance: Casual  Eye Contact::  Good  Speech:  Clear and Coherent  Volume:  Normal  Mood:  7 /10 on a scale of 1 is the best and 10 is the worst  Anxiety:  8/10 on the same scale  Affect:  Congruent  Thought Process:  Coherent  Orientation:  Full  Thought Content:  WDL  Suicidal Thoughts:  No  Homicidal Thoughts:  No  Memory:  Immediate;   Good  Judgement:  Fair  Insight:  Fair  Psychomotor Activity:  Normal  Concentration:  Fair  Recall:  Fair  Akathisia:  No  AIMS (if indicated):     Assets:  Communication Skills Desire for Improvement Financial Resources/Insurance Housing Intimacy Leisure Time Physical Health Social Support  Sleep:  Number of Hours: 6.75    Vital Signs:Blood pressure 119/77, pulse 94, temperature 98.3 F (36.8 C), temperature source Oral, resp. rate 18, height 5' 6.5" (1.689 m), weight 88.451 kg (195 lb), last menstrual period 09/29/2010. Current Medications: Current Facility-Administered Medications  Medication Dose Route Frequency Provider Last Rate Last Dose  . acetaminophen (TYLENOL) tablet 650 mg  650 mg Oral Q6H PRN Viviann Spare, NP   650 mg at 06/18/11 0837  . calcium carbonate (TUMS - dosed in mg elemental calcium) chewable tablet 400 mg of elemental calcium  2 tablet Oral QID PRN Viviann Spare, NP   400 mg of elemental calcium at 06/19/11 1659  . diphenhydrAMINE (BENADRYL) capsule 25 mg  25 mg Oral QHS PRN Viviann Spare, NP   25 mg at 06/20/11 2149  . docusate sodium (COLACE) capsule 100 mg  100 mg Oral Daily Naima A Dillard, MD   100 mg at 06/22/11 0847  . escitalopram (LEXAPRO) tablet 10  mg  10 mg Oral Daily Viviann Spare, NP   10 mg at 06/22/11 0847  . prenatal multivitamin tablet 1 tablet  1 tablet Oral Daily Viviann Spare, NP   1 tablet at 06/22/11 0847  . risperiDONE (RISPERDAL) tablet 0.25 mg  0.25 mg Oral QHS Mike Craze, MD      . risperiDONE (RISPERDAL) tablet 1 mg  1 mg Oral QHS Mike Craze, MD   1 mg at 06/22/11 2207  . zolpidem (AMBIEN) tablet 5 mg  5 mg Oral QHS PRN,MR X 1 Lavera Guise, CNM   5 mg at 06/22/11 2207  . DISCONTD: risperiDONE (RISPERDAL) tablet 0.25 mg  0.25 mg Oral BID Mike Craze, MD   0.25 mg at 06/22/11 1650  . DISCONTD: risperiDONE (RISPERDAL) tablet 0.5 mg  0.5 mg Oral QHS Mike Craze, MD   0.5 mg at 06/21/11 2136    Lab Results: No results found for this or any previous visit (from the past 48 hour(s)).  Physical Findings: AIMS:  , ,  ,  ,    CIWA:    COWS:     Treatment Plan Summary: Daily contact with patient to assess and evaluate symptoms and progress in treatment Medication management  Plan: Pt has been on Risperdal for a day now.  She notes her anxiety is not much better, but her moods feel more stable.  She  is feeling groggy during the day and requests to have the meds just at night.  She just took her 1700 dose of 0.25 mg.  Will give her 1.0 mg tonight and then progress to 1.25mg  tomorrow night.  She may be ready for D/C tomorrow. Donat Humble 06/22/2011, 10:27 PM

## 2011-06-22 NOTE — Progress Notes (Signed)
Pt has been isolative to room today, has not had any participation in various milieu activities, pt has spoken to this staff about discharge and has asked that she be discharged soon, pt did endorse depression and anxiety today, pt has received all medications without incident, support provided, will continue to monitor

## 2011-06-22 NOTE — Progress Notes (Signed)
Staff from [facility] came to asses FHT. Reports that all is WNL. Pt presently resting with eyes closed and respirations even and unlabored. No distress noted.

## 2011-06-22 NOTE — Progress Notes (Signed)
Consulted with Dr Estanislado Pandy regarding FHT's Clarified order to be FHT's per doppler BID NST q week No need for further NST at this time

## 2011-06-22 NOTE — Progress Notes (Signed)
Patient ID: Monica [name], female   DOB: [birth year], 31 y.o.   MRN: [id number]  Pt stated that she was having small contractions. Asked pt if she was having any concerns or questions. Writer let the pt borrow her watch to time the contracts. Pt requested prn ambien and went to bed. Support and encouragement was offered.

## 2011-06-23 DIAGNOSIS — F1210 Cannabis abuse, uncomplicated: Secondary | ICD-10-CM

## 2011-06-23 MED ORDER — RISPERIDONE 1 MG PO TABS: 1.0000 mg | ORAL_TABLET | Freq: Every day | ORAL | Status: DC

## 2011-06-23 MED ORDER — RISPERIDONE 0.25 MG PO TABS: 0.2500 mg | ORAL_TABLET | Freq: Every day | ORAL | Status: DC

## 2011-06-23 MED ORDER — PRENATAL MULTIVITAMIN CH: 1.0000 | ORAL_TABLET | Freq: Every day | ORAL | Status: DC
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
## 2011-06-23 NOTE — Progress Notes (Signed)
06/23/2011 Nursing D 0900 D Pt is seen OOB UAL on the 5000 hall this AM...tolerated well. She is pleasant and cooperative. SHe denied SI. SHe completed her self inventory and on it she wrote she rated her feelings of depression and hopelessness " 3 / 3 " and stated her DC plan is : " take meds and go to therapy". OB / GYN RN here to assess FHR = 142. Pt states she denies abd pain and / or uterine cramping and states her baby is " moving good". A DC impending. R Safety is maintained and POC includes pt to be DC this AM per POC PD RN [facility]

## 2011-06-23 NOTE — Tx Team (Signed)
Interdisciplinary Treatment Plan Update (Adult)  Date:  06/23/2011  Time Reviewed:  10:48 AM   Progress in Treatment: Attending groups: Yes Participating in groups:  Yes Taking medication as prescribed: Yes Tolerating medication:  Yes Family/Significant other contact made:  No, pt refused contact Patient understands diagnosis:  Yes Discussing patient identified problems/goals with staff:  Yes Medical problems stabilized or resolved:  Yes Denies suicidal/homicidal ideation: Yes Issues/concerns per patient self-inventory:  None identified Other: N/A  New problem(s) identified: None Identified  Reason for Continuation of Hospitalization: Stable to d/c  Interventions implemented related to continuation of hospitalization: Stable to d/c  Additional comments: N/A  Estimated length of stay: D/C today  Discharge Plan: Pt will follow up at [facility] Out[facility] for medication management and therapy  New goal(s): N/A  Review of initial/current patient goals per problem list:    1.  Goal(s): Reduce depressive symptoms  Met:  Yes  Target date: by discharge  As evidenced by: Reducing depression from a 10 to a 3 as reported by pt.  Pt ranks at a 3 today.   2.  Goal (s): Reduce/Eliminate suicidal ideation  Met:  Yes  Target date: by discharge  As evidenced by: pt reporting no SI.    3.  Goal(s): Reduce anxiety symptoms  Met:  Yes  Target date: by discharge  As evidenced by: Reduce anxiety from a 10 to a 3 as reported by pt.  Pt ranks at a 3 today.    Attendees: Patient:  Monica [name] 06/23/2011 10:50 AM   Family:     Physician:  Orson Aloe, MD  06/23/2011  10:48 AM   Nursing:   Atha Starks, RN 06/23/2011 10:51 AM   Case Manager:  Reyes Ivan, LCSWA 06/23/2011  10:48 AM   Counselor:  Angus Palms, LCSW 06/23/2011  10:48 AM   Other:  Juline Patch, LCSW 06/23/2011  10:48 AM   Other:  Serena Colonel, NP 06/23/2011  10:48 AM   Other:  Charlyne Mom,  counseling intern 06/23/2011 10:51 AM   Other:      Scribe for Treatment Team:   Carmina Miller, 06/23/2011 , 10:48 AM

## 2011-06-23 NOTE — Progress Notes (Addendum)
06/23/2011 Nursing 1400 DC note Pt states she is ready for DC. MD completed DC AVS and SRA. Pt denied audit, vis, tactile halluc.  Pt is given  DC instructions and states she understands. She is able to discuss not continuing Lexapro and Dr. Dan Humphreys explains he has decided not to do this. A She denies SI. She states she will keep her f/u plans and that she has better healthier coping skills today.Marland Kitchenas  opposed to prior to her admission here. A At this time she reports that she is having uterine contractions ( on scale of 1-10, with 10 being the worse, they are " 6") and they are about 15-20 minutes apart. She states she has " good" fetal movement and that she understands, given this will be her 3rd delivery, she wants to go home and " rest" and then, she says she  will go to ED at [facility]...when the contractions get closer together (" closer than  25 minutes apart"). She holds her lower abdomen with both hands and is able to ambulate ...slowly..with minimal difficulty..After her belongings are returned to her and DC instructions are given to her and SRA completed, she is escorted to bldg entrance and DC'd. PD RN [facility]

## 2011-06-23 NOTE — Progress Notes (Signed)
[facility] Case Management Discharge Plan:  Will you be returning to the same living situation after discharge: Yes,  return home At discharge, do you have transportation home?:Yes,  mother will pick pt up Do you have the ability to pay for your medications:Yes,  access to meds  Release of information consent forms completed and in the chart;  Patient's signature needed at discharge.  Patient to Follow up at:  Follow-up Information    Follow up with [facility] - Outpatient on 07/11/2011. (If paperwork is filled out, arrive at 8:00 AM for appointment with Deetta Perla (therapy) (7:45 AM if paperwork is not filled out))    Contact information:   [address] [place], [place] [address] [phone]      Follow up with [facility] - Outpatient on 07/26/2011. (Appointment scheduled at 3:30 PM with Jorje Guild, PA)    Contact information:   [address] [place], [place] [address] [phone]         Patient denies SI/HI:   Yes,  denies SI/HI    Safety Planning and Suicide Prevention discussed:  Yes,  discussed with pt  Barrier to discharge identified:No.  Summary and Recommendations: Pt ranks depression and anxiety at a 3 today.  Pt denies SI/HI.  Pt reports feeling stable to d/c today.  Pt reports being sleepy but ready to rest at home.  No recommendations from SW.  No further needs voiced by pt.  Pt stable to discharge.     Carmina Miller 06/23/2011, 10:36 AM

## 2011-06-23 NOTE — Discharge Instructions (Addendum)
Scripts are sent to [facility]  TEGRETOL could be helpful after delivery for control of mood fluctuations and for control of anxiety.  This needs to be followed by lab testing to be sure the drug level in the blood is in range and is not toxic.  Tegretol speeds up the metabolism of a lot of things including itself.  In about a month the blood level of the Tegretol will be at about 60% of where it was when you started, so the dose will probably have to be increased.

## 2011-06-23 NOTE — [facility] Suicide Risk Assessment (Signed)
Suicide Risk Assessment  Discharge Assessment     Demographic factors:  Assessment Details Time of Assessment: Admission Information Obtained From: Patient Current Mental Status:  Current Mental Status: Suicidal ideation indicated by patient Risk Reduction Factors:  Risk Reduction Factors: Pregnancy  CLINICAL FACTORS:   Severe Anxiety and/or Agitation Bipolar Disorder:   Bipolar II Previous Psychiatric Diagnoses and Treatments  COGNITIVE FEATURES THAT CONTRIBUTE TO RISK:  Thought constriction (tunnel vision)    SUICIDE RISK:   Minimal: No identifiable suicidal ideation.  Patients presenting with no risk factors but with morbid ruminations; may be classified as minimal risk based on the severity of the depressive symptoms  ADL's:  Intact  Sleep: Good  Appetite:  Good  Suicidal Ideation:  Denies adamantly any suicidal thoughts. Homicidal Ideation:  Denies adamantly any homicidal thoughts.  Mental Status Examination/Evaluation: Objective:  Appearance: Casual  Eye Contact::  Good  Speech:  Clear and Coherent  Volume:  Normal  Mood:  Euthymic  Affect:  Congruent  Thought Process:  Coherent  Orientation:  Full  Thought Content:  WDL  Suicidal Thoughts:  No  Homicidal Thoughts:  No  Memory:  Immediate;   Good  Judgement:  Good  Insight:  Good  Psychomotor Activity:  Normal  Concentration:  Good  Recall:  Good  Akathisia:  No  AIMS (if indicated):     Assets:  Communication Skills Desire for Improvement Financial Resources/Insurance Housing Intimacy Leisure Time Physical Health Resilience Social Support Talents/Skills  Sleep: Number of Hours: 4.5    Vital Signs: Blood pressure 121/78, pulse 95, temperature 97.5 F (36.4 C), temperature source Oral, resp. rate 16, height 5' 6.5" (1.689 m), weight 88.451 kg (195 lb), last menstrual period 09/29/2010.  Labs No results found for this or any previous visit (from the past 48 hour(s)).  What pt has learned from  hospital stay is that she needs to focus on the present.  What she will do different with her life is take her medications no matter who tells her that she doesn't need them.  Risk of self harm is elevated by her diagnosis and her past history of 2 prior attempts, but she has her children and herself to live for.  Risk of harm to others is minimal in that she has not been involved in fights or had any legal charges filed on her.  PLAN: Discharge home Continue Medication List  As of 06/23/2011  1:36 PM   TAKE these medications         acetaminophen 500 MG tablet   Commonly known as: TYLENOL   Take 1,000 mg by mouth every 6 (six) hours as needed. For pain.      prenatal multivitamin Tabs   Take 1 tablet by mouth daily. For nutritional supplementation.      risperiDONE 0.25 MG tablet   Commonly known as: RISPERDAL   Take 1 tablet (0.25 mg total) by mouth at bedtime. For mood control.      risperiDONE 1 MG tablet   Commonly known as: RISPERDAL   Take 1 tablet (1 mg total) by mouth at bedtime. Take with 0.25 mg dose at Bedtime For mood control.      zolpidem 10 MG tablet   Commonly known as: AMBIEN   Take 10 mg by mouth at bedtime as needed. sleep           Monica [name] 06/23/2011, 1:36 PM

## 2011-06-23 NOTE — Progress Notes (Signed)
Lavera Guise, CNM reviewed paper strip as reactive and signed strip.

## 2011-06-23 NOTE — Discharge Summary (Signed)
I agree with this D/C Summary.  

## 2011-06-23 NOTE — Progress Notes (Signed)
[facility] Group Notes:  (Counselor/Nursing/MHT/Case Management/Adjunct)  06/23/2011 11:41 AM  Type of Therapy:  Group Therapy  Participation Level:  Active  Participation Quality:  Appropriate  Affect:  Appropriate  Cognitive:  Appropriate  Insight:  Good  Engagement in Group:  Good  Engagement in Therapy:  Good  Modes of Intervention:  Support  Summary of Progress/Problems: Patient listened attentively while others spoke and participated actively in group, sharing her own coping strategies. These include exercise, planning for the future (patient expressed excitement about starting school this summer), and travel. She also made several insightful comments about how the various domains of wellness are all interconnected.   [name], Monica Matus 06/23/2011, 11:41 AM

## 2011-06-23 NOTE — Progress Notes (Signed)
Recreation Therapy Notes  06/23/2011         Time: 1415      Group Topic/Focus: The focus of this group is on enhancing the patient's understanding of leisure, barriers to leisure, and the importance of engaging in positive leisure activities upon discharge for improved total health.  Participation Level: Active  Participation Quality: Appropriate and Attentive  Affect: Appropriate  Cognitive: Oriented   Additional Comments: Patient present in group until discharge.   Monica [name] 06/23/2011 3:55 PM

## 2011-06-26 ENCOUNTER — Encounter (INDEPENDENT_AMBULATORY_CARE_PROVIDER_SITE_OTHER): Payer: Managed Care, Other (non HMO) | Admitting: Obstetrics and Gynecology

## 2011-06-26 DIAGNOSIS — Z202 Contact with and (suspected) exposure to infections with a predominantly sexual mode of transmission: Secondary | ICD-10-CM

## 2011-06-26 DIAGNOSIS — Z331 Pregnant state, incidental: Secondary | ICD-10-CM

## 2011-06-26 DIAGNOSIS — N898 Other specified noninflammatory disorders of vagina: Secondary | ICD-10-CM

## 2011-06-28 NOTE — Progress Notes (Signed)
Patient Discharge Instructions:  Psychiatric Admission Assessment Note Provided,  06/28/2011 Discharge Summary Note Provided,   06/28/2011 After Visit Summary (AVS) Provided,  06/28/2011 Face Sheet Provided, 06/28/2011 Faxed/Sent to the Next Level Care provider:  06/28/2011 Next Level Care Provider Has Access to the EMR, 06/28/2011  Records available to [facility] Clerance Lav, and Berenice Bouton PA via [facility] EMR access.  Wandra Scot, 06/28/2011, 12:55 PM

## 2011-06-29 ENCOUNTER — Encounter (HOSPITAL_COMMUNITY): Payer: Self-pay | Admitting: Pharmacist

## 2011-07-04 ENCOUNTER — Encounter (INDEPENDENT_AMBULATORY_CARE_PROVIDER_SITE_OTHER): Payer: Managed Care, Other (non HMO) | Admitting: Obstetrics and Gynecology

## 2011-07-04 DIAGNOSIS — Z3480 Encounter for supervision of other normal pregnancy, unspecified trimester: Secondary | ICD-10-CM

## 2011-07-11 ENCOUNTER — Ambulatory Visit (HOSPITAL_COMMUNITY): Payer: Self-pay | Admitting: Psychology

## 2011-07-12 ENCOUNTER — Encounter (INDEPENDENT_AMBULATORY_CARE_PROVIDER_SITE_OTHER): Payer: Managed Care, Other (non HMO) | Admitting: Obstetrics and Gynecology

## 2011-07-12 DIAGNOSIS — Z331 Pregnant state, incidental: Secondary | ICD-10-CM

## 2011-07-13 ENCOUNTER — Encounter (HOSPITAL_COMMUNITY): Payer: Self-pay

## 2011-07-13 ENCOUNTER — Encounter (HOSPITAL_COMMUNITY)
Admit: 2011-07-13 | Discharge: 2011-07-13 | Disposition: A | Discharge status: Home / Self Care | Payer: Managed Care, Other (non HMO) | Attending: Obstetrics and Gynecology | Admitting: Obstetrics and Gynecology

## 2011-07-13 DIAGNOSIS — O165 Unspecified maternal hypertension, complicating the puerperium: Secondary | ICD-10-CM | POA: Insufficient documentation

## 2011-07-13 DIAGNOSIS — O209 Hemorrhage in early pregnancy, unspecified: Secondary | ICD-10-CM | POA: Insufficient documentation

## 2011-07-13 DIAGNOSIS — O1205 Gestational edema, complicating the puerperium: Secondary | ICD-10-CM | POA: Insufficient documentation

## 2011-07-13 DIAGNOSIS — N926 Irregular menstruation, unspecified: Secondary | ICD-10-CM | POA: Insufficient documentation

## 2011-07-13 HISTORY — DX: Bipolar disorder, unspecified: F31.9

## 2011-07-13 LAB — CBC
HCT: 32.7 % — ABNORMAL LOW (ref 36.0–46.0)
Hemoglobin: 10.5 g/dL — ABNORMAL LOW (ref 12.0–15.0)
MCH: 29.9 pg (ref 26.0–34.0)
MCHC: 32.1 g/dL (ref 30.0–36.0)
MCV: 93.2 fL (ref 78.0–100.0)
RBC: 3.51 MIL/uL — ABNORMAL LOW (ref 3.87–5.11)

## 2011-07-13 LAB — SURGICAL PCR SCREEN: Staphylococcus aureus: NEGATIVE

## 2011-07-13 LAB — URINE MICROSCOPIC-ADD ON

## 2011-07-13 LAB — TYPE AND SCREEN
ABO/RH(D): O POS
Antibody Screen: NEGATIVE

## 2011-07-13 LAB — URINALYSIS, ROUTINE W REFLEX MICROSCOPIC
Bilirubin Urine: NEGATIVE
Glucose, UA: NEGATIVE mg/dL
Ketones, ur: 15 mg/dL — AB
Nitrite: NEGATIVE
Specific Gravity, Urine: 1.02 (ref 1.005–1.030)
pH: 6.5 (ref 5.0–8.0)

## 2011-07-13 LAB — ABO/RH: ABO/RH(D): O POS

## 2011-07-13 MED ORDER — CEFAZOLIN SODIUM-DEXTROSE 2-3 GM-% IV SOLR
2.0000 g | INTRAVENOUS | Status: AC
  Administered 2011-07-14: 2 g via INTRAVENOUS
  Filled 2011-07-13: qty 50

## 2011-07-13 NOTE — Patient Instructions (Addendum)
20 Monica [name]  07/13/2011   Your procedure is scheduled on:  07/14/11  Enter through the Main Entrance of [facility] at 8 AM.  Pick up the phone at the desk and dial [phone].   Call this number if you have problems the morning of surgery: [phone]   Remember:   Do not eat food:After Midnight.  Do not drink clear liquids: After Midnight.  Take these medicines the morning of surgery with A SIP OF WATER: NA   Do not wear jewelry, make-up or nail polish.  Do not wear lotions, powders, or perfumes. You may wear deodorant.  Do not shave 48 hours prior to surgery.  Do not bring valuables to the hospital.  Contacts, dentures or bridgework may not be worn into surgery.  Leave suitcase in the car. After surgery it may be brought to your room.  For patients admitted to the hospital, checkout time is 11:00 AM the day of discharge.   Patients discharged the day of surgery will not be allowed to drive home.  Name and phone number of your driver: NA  Special Instructions: [facility] Shower Use Special Wash: 1/2 bottle night before surgery and 1/2 bottle morning of surgery.   Please read over the following fact sheets that you were given: MRSA Information

## 2011-07-14 ENCOUNTER — Encounter: Payer: Self-pay | Admitting: Obstetrics and Gynecology

## 2011-07-14 ENCOUNTER — Encounter (HOSPITAL_COMMUNITY): Payer: Self-pay | Admitting: Obstetrics and Gynecology

## 2011-07-14 ENCOUNTER — Encounter (HOSPITAL_COMMUNITY): Payer: Self-pay | Admitting: *Deleted

## 2011-07-14 ENCOUNTER — Encounter (HOSPITAL_COMMUNITY): Payer: Self-pay | Admitting: Anesthesiology

## 2011-07-14 ENCOUNTER — Inpatient Hospital Stay (HOSPITAL_COMMUNITY)
Admission: RE | Admit: 2011-07-14 | Discharge: 2011-07-17 | DRG: 766 | Disposition: A | Discharge status: Home / Self Care | Payer: Managed Care, Other (non HMO) | Source: Ambulatory Visit | Attending: Obstetrics and Gynecology | Admitting: Obstetrics and Gynecology

## 2011-07-14 ENCOUNTER — Inpatient Hospital Stay (HOSPITAL_COMMUNITY): Payer: Managed Care, Other (non HMO) | Admitting: Anesthesiology

## 2011-07-14 ENCOUNTER — Encounter (HOSPITAL_COMMUNITY)
Admission: RE | Disposition: A | Discharge status: Home / Self Care | Payer: Self-pay | Source: Ambulatory Visit | Attending: Obstetrics and Gynecology

## 2011-07-14 DIAGNOSIS — O34219 Maternal care for unspecified type scar from previous cesarean delivery: Principal | ICD-10-CM | POA: Diagnosis present

## 2011-07-14 DIAGNOSIS — Z01818 Encounter for other preprocedural examination: Secondary | ICD-10-CM

## 2011-07-14 DIAGNOSIS — Z87891 Personal history of nicotine dependence: Secondary | ICD-10-CM

## 2011-07-14 DIAGNOSIS — Z98891 History of uterine scar from previous surgery: Secondary | ICD-10-CM

## 2011-07-14 DIAGNOSIS — Z8659 Personal history of other mental and behavioral disorders: Secondary | ICD-10-CM

## 2011-07-14 DIAGNOSIS — Z3480 Encounter for supervision of other normal pregnancy, unspecified trimester: Secondary | ICD-10-CM

## 2011-07-14 DIAGNOSIS — O26899 Other specified pregnancy related conditions, unspecified trimester: Secondary | ICD-10-CM | POA: Diagnosis not present

## 2011-07-14 DIAGNOSIS — Z01812 Encounter for preprocedural laboratory examination: Secondary | ICD-10-CM

## 2011-07-14 DIAGNOSIS — Z915 Personal history of self-harm: Secondary | ICD-10-CM

## 2011-07-14 DIAGNOSIS — O9903 Anemia complicating the puerperium: Secondary | ICD-10-CM | POA: Diagnosis not present

## 2011-07-14 DIAGNOSIS — D649 Anemia, unspecified: Secondary | ICD-10-CM | POA: Diagnosis not present

## 2011-07-14 DIAGNOSIS — Z8619 Personal history of other infectious and parasitic diseases: Secondary | ICD-10-CM

## 2011-07-14 DIAGNOSIS — F319 Bipolar disorder, unspecified: Secondary | ICD-10-CM | POA: Diagnosis present

## 2011-07-14 DIAGNOSIS — L299 Pruritus, unspecified: Secondary | ICD-10-CM | POA: Diagnosis not present

## 2011-07-14 DIAGNOSIS — Z62819 Personal history of unspecified abuse in childhood: Secondary | ICD-10-CM

## 2011-07-14 DIAGNOSIS — Z87898 Personal history of other specified conditions: Secondary | ICD-10-CM

## 2011-07-14 DIAGNOSIS — O99344 Other mental disorders complicating childbirth: Secondary | ICD-10-CM | POA: Diagnosis present

## 2011-07-14 HISTORY — PX: SCAR REVISION: SHX5285

## 2011-07-14 SURGERY — Surgical Case
Anesthesia: Spinal | Site: Abdomen | Wound class: Clean

## 2011-07-14 MED ORDER — HYDROMORPHONE HCL PF 1 MG/ML IJ SOLN
0.2500 mg | INTRAMUSCULAR | Status: DC | PRN
  Administered 2011-07-14 (×2): 0.5 mg via INTRAVENOUS

## 2011-07-14 MED ORDER — SCOPOLAMINE 1 MG/3DAYS TD PT72
1.0000 | MEDICATED_PATCH | TRANSDERMAL | Status: DC
  Administered 2011-07-14: 1.5 mg via TRANSDERMAL

## 2011-07-14 MED ORDER — MORPHINE SULFATE 0.5 MG/ML IJ SOLN
INTRAMUSCULAR | Status: AC
  Filled 2011-07-14: qty 10

## 2011-07-14 MED ORDER — MEDROXYPROGESTERONE ACETATE 150 MG/ML IM SUSP: 150.0000 mg | INTRAMUSCULAR | Status: DC | PRN

## 2011-07-14 MED ORDER — DIPHENHYDRAMINE HCL 25 MG PO CAPS
25.0000 mg | ORAL_CAPSULE | ORAL | Status: DC | PRN
  Administered 2011-07-14: 25 mg via ORAL
  Filled 2011-07-14 (×3): qty 1

## 2011-07-14 MED ORDER — MEPERIDINE HCL 25 MG/ML IJ SOLN: 6.2500 mg | INTRAMUSCULAR | Status: DC | PRN

## 2011-07-14 MED ORDER — PRENATAL MULTIVITAMIN CH
1.0000 | ORAL_TABLET | Freq: Every day | ORAL | Status: DC
  Administered 2011-07-15 – 2011-07-16 (×2): 1 via ORAL
  Filled 2011-07-14 (×2): qty 1

## 2011-07-14 MED ORDER — ZOLPIDEM TARTRATE 10 MG PO TABS: 10.0000 mg | ORAL_TABLET | Freq: Every evening | ORAL | Status: DC | PRN

## 2011-07-14 MED ORDER — ONDANSETRON HCL 4 MG/2ML IJ SOLN: 4.0000 mg | INTRAMUSCULAR | Status: DC | PRN

## 2011-07-14 MED ORDER — TETANUS-DIPHTH-ACELL PERTUSSIS 5-2.5-18.5 LF-MCG/0.5 IM SUSP
0.5000 mL | Freq: Once | INTRAMUSCULAR | Status: DC
  Filled 2011-07-14: qty 0.5

## 2011-07-14 MED ORDER — METOCLOPRAMIDE HCL 5 MG/ML IJ SOLN: 10.0000 mg | Freq: Three times a day (TID) | INTRAMUSCULAR | Status: DC | PRN

## 2011-07-14 MED ORDER — DIPHENHYDRAMINE HCL 50 MG/ML IJ SOLN: 25.0000 mg | INTRAMUSCULAR | Status: DC | PRN

## 2011-07-14 MED ORDER — EPHEDRINE SULFATE 50 MG/ML IJ SOLN
INTRAMUSCULAR | Status: DC | PRN
  Administered 2011-07-14 (×8): 10 mg via INTRAVENOUS

## 2011-07-14 MED ORDER — SIMETHICONE 80 MG PO CHEW
80.0000 mg | CHEWABLE_TABLET | Freq: Three times a day (TID) | ORAL | Status: DC
  Administered 2011-07-14 – 2011-07-17 (×10): 80 mg via ORAL

## 2011-07-14 MED ORDER — ZOLPIDEM TARTRATE 5 MG PO TABS: 5.0000 mg | ORAL_TABLET | Freq: Every evening | ORAL | Status: DC | PRN

## 2011-07-14 MED ORDER — SCOPOLAMINE 1 MG/3DAYS TD PT72
MEDICATED_PATCH | TRANSDERMAL | Status: AC
  Administered 2011-07-14: 1.5 mg via TRANSDERMAL
  Filled 2011-07-14: qty 1

## 2011-07-14 MED ORDER — DIPHENHYDRAMINE HCL 50 MG/ML IJ SOLN
12.5000 mg | INTRAMUSCULAR | Status: DC | PRN
  Administered 2011-07-14: 12.5 mg via INTRAVENOUS

## 2011-07-14 MED ORDER — TETANUS-DIPHTH-ACELL PERTUSSIS 5-2.5-18.5 LF-MCG/0.5 IM SUSP
0.5000 mL | Freq: Once | INTRAMUSCULAR | Status: AC
  Administered 2011-07-16: 0.5 mL via INTRAMUSCULAR

## 2011-07-14 MED ORDER — OXYTOCIN 10 UNIT/ML IJ SOLN
INTRAMUSCULAR | Status: AC
  Filled 2011-07-14: qty 4

## 2011-07-14 MED ORDER — ONDANSETRON HCL 4 MG PO TABS: 4.0000 mg | ORAL_TABLET | ORAL | Status: DC | PRN

## 2011-07-14 MED ORDER — OXYCODONE-ACETAMINOPHEN 5-325 MG PO TABS
1.0000 | ORAL_TABLET | ORAL | Status: DC | PRN
  Administered 2011-07-15: 1 via ORAL
  Administered 2011-07-15 (×3): 2 via ORAL
  Administered 2011-07-15: 1 via ORAL
  Administered 2011-07-16 – 2011-07-17 (×6): 2 via ORAL
  Filled 2011-07-14: qty 1
  Filled 2011-07-14 (×3): qty 2
  Filled 2011-07-14: qty 1
  Filled 2011-07-14 (×6): qty 2

## 2011-07-14 MED ORDER — RISPERIDONE 0.25 MG PO TABS
0.2500 mg | ORAL_TABLET | Freq: Every day | ORAL | Status: DC
  Administered 2011-07-14 – 2011-07-16 (×3): 0.25 mg via ORAL
  Filled 2011-07-14 (×3): qty 1

## 2011-07-14 MED ORDER — FENTANYL CITRATE 0.05 MG/ML IJ SOLN
INTRAMUSCULAR | Status: AC
  Filled 2011-07-14: qty 2

## 2011-07-14 MED ORDER — OXYTOCIN 20 UNITS IN LACTATED RINGERS INFUSION - SIMPLE
INTRAVENOUS | Status: DC | PRN
  Administered 2011-07-14: 20 [IU] via INTRAVENOUS

## 2011-07-14 MED ORDER — RISPERIDONE 1 MG PO TABS
1.0000 mg | ORAL_TABLET | Freq: Every day | ORAL | Status: DC
  Administered 2011-07-14 – 2011-07-16 (×3): 1 mg via ORAL
  Filled 2011-07-14 (×3): qty 1

## 2011-07-14 MED ORDER — NALBUPHINE HCL 10 MG/ML IJ SOLN
5.0000 mg | INTRAMUSCULAR | Status: DC | PRN
  Filled 2011-07-14 (×2): qty 1

## 2011-07-14 MED ORDER — IBUPROFEN 600 MG PO TABS
600.0000 mg | ORAL_TABLET | Freq: Four times a day (QID) | ORAL | Status: DC | PRN
  Administered 2011-07-15 – 2011-07-16 (×3): 600 mg via ORAL
  Filled 2011-07-14 (×7): qty 1

## 2011-07-14 MED ORDER — NALOXONE HCL 0.4 MG/ML IJ SOLN: 0.4000 mg | INTRAMUSCULAR | Status: DC | PRN

## 2011-07-14 MED ORDER — PHENYLEPHRINE 40 MCG/ML (10ML) SYRINGE FOR IV PUSH (FOR BLOOD PRESSURE SUPPORT)
PREFILLED_SYRINGE | INTRAVENOUS | Status: AC
  Filled 2011-07-14: qty 5

## 2011-07-14 MED ORDER — NALBUPHINE HCL 10 MG/ML IJ SOLN
5.0000 mg | INTRAMUSCULAR | Status: DC | PRN
  Administered 2011-07-14 – 2011-07-15 (×3): 10 mg via INTRAVENOUS
  Filled 2011-07-14 (×3): qty 1

## 2011-07-14 MED ORDER — PHENYLEPHRINE HCL 10 MG/ML IJ SOLN
INTRAMUSCULAR | Status: DC | PRN
  Administered 2011-07-14 (×5): 80 ug via INTRAVENOUS
  Administered 2011-07-14: 120 ug via INTRAVENOUS
  Administered 2011-07-14: 80 ug via INTRAVENOUS

## 2011-07-14 MED ORDER — FENTANYL CITRATE 0.05 MG/ML IJ SOLN
INTRAMUSCULAR | Status: DC | PRN
  Administered 2011-07-14: 25 ug via INTRATHECAL

## 2011-07-14 MED ORDER — DIPHENHYDRAMINE HCL 25 MG PO CAPS
25.0000 mg | ORAL_CAPSULE | Freq: Four times a day (QID) | ORAL | Status: DC | PRN
  Administered 2011-07-14 – 2011-07-16 (×2): 25 mg via ORAL
  Filled 2011-07-14: qty 1

## 2011-07-14 MED ORDER — HYDROMORPHONE HCL PF 1 MG/ML IJ SOLN
INTRAMUSCULAR | Status: AC
  Administered 2011-07-14: 0.5 mg via INTRAVENOUS
  Filled 2011-07-14: qty 1

## 2011-07-14 MED ORDER — SENNOSIDES-DOCUSATE SODIUM 8.6-50 MG PO TABS
2.0000 | ORAL_TABLET | Freq: Every day | ORAL | Status: DC
  Administered 2011-07-14 – 2011-07-16 (×3): 2 via ORAL

## 2011-07-14 MED ORDER — DIPHENHYDRAMINE HCL 50 MG/ML IJ SOLN
INTRAMUSCULAR | Status: AC
  Administered 2011-07-14: 12.5 mg via INTRAVENOUS
  Filled 2011-07-14: qty 1

## 2011-07-14 MED ORDER — MENTHOL 3 MG MT LOZG: 1.0000 | LOZENGE | OROMUCOSAL | Status: DC | PRN

## 2011-07-14 MED ORDER — KETOROLAC TROMETHAMINE 30 MG/ML IJ SOLN: 30.0000 mg | Freq: Four times a day (QID) | INTRAMUSCULAR | Status: AC | PRN

## 2011-07-14 MED ORDER — IBUPROFEN 200 MG PO TABS: 200.0000 mg | ORAL_TABLET | Freq: Four times a day (QID) | ORAL | Status: DC | PRN

## 2011-07-14 MED ORDER — OXYTOCIN 20 UNITS IN LACTATED RINGERS INFUSION - SIMPLE: 125.0000 mL/h | INTRAVENOUS | Status: AC

## 2011-07-14 MED ORDER — MORPHINE SULFATE (PF) 0.5 MG/ML IJ SOLN
INTRAMUSCULAR | Status: DC | PRN
Start: 2011-07-14 — End: 2011-07-14
  Administered 2011-07-14: .15 mg via INTRATHECAL

## 2011-07-14 MED ORDER — EPHEDRINE 5 MG/ML INJ
INTRAVENOUS | Status: AC
  Filled 2011-07-14: qty 10

## 2011-07-14 MED ORDER — ONDANSETRON HCL 4 MG/2ML IJ SOLN: 4.0000 mg | Freq: Once | INTRAMUSCULAR | Status: DC | PRN

## 2011-07-14 MED ORDER — ONDANSETRON HCL 4 MG/2ML IJ SOLN
INTRAMUSCULAR | Status: DC | PRN
  Administered 2011-07-14: 4 mg via INTRAVENOUS

## 2011-07-14 MED ORDER — ONDANSETRON HCL 4 MG/2ML IJ SOLN: 4.0000 mg | Freq: Three times a day (TID) | INTRAMUSCULAR | Status: DC | PRN

## 2011-07-14 MED ORDER — SODIUM CHLORIDE 0.9 % IJ SOLN: 3.0000 mL | INTRAMUSCULAR | Status: DC | PRN

## 2011-07-14 MED ORDER — LACTATED RINGERS IV SOLN
INTRAVENOUS | Status: DC
  Administered 2011-07-14: 125 mL/h via INTRAVENOUS
  Administered 2011-07-14 (×3): via INTRAVENOUS

## 2011-07-14 MED ORDER — LACTATED RINGERS IV SOLN
INTRAVENOUS | Status: DC
  Administered 2011-07-14 – 2011-07-15 (×2): via INTRAVENOUS

## 2011-07-14 MED ORDER — IBUPROFEN 600 MG PO TABS
600.0000 mg | ORAL_TABLET | Freq: Four times a day (QID) | ORAL | Status: DC
  Administered 2011-07-14 – 2011-07-17 (×7): 600 mg via ORAL
  Filled 2011-07-14 (×4): qty 1

## 2011-07-14 MED ORDER — SODIUM CHLORIDE 0.9 % IV SOLN
1.0000 ug/kg/h | INTRAVENOUS | Status: DC | PRN
  Filled 2011-07-14: qty 2.5

## 2011-07-14 MED ORDER — WITCH HAZEL-GLYCERIN EX PADS: 1.0000 "application " | MEDICATED_PAD | CUTANEOUS | Status: DC | PRN

## 2011-07-14 MED ORDER — KETOROLAC TROMETHAMINE 60 MG/2ML IM SOLN
60.0000 mg | Freq: Once | INTRAMUSCULAR | Status: AC | PRN
  Filled 2011-07-14: qty 2

## 2011-07-14 MED ORDER — LANOLIN HYDROUS EX OINT: 1.0000 "application " | TOPICAL_OINTMENT | CUTANEOUS | Status: DC | PRN

## 2011-07-14 MED ORDER — SCOPOLAMINE 1 MG/3DAYS TD PT72
1.0000 | MEDICATED_PATCH | Freq: Once | TRANSDERMAL | Status: DC
  Filled 2011-07-14: qty 1

## 2011-07-14 MED ORDER — KETOROLAC TROMETHAMINE 30 MG/ML IJ SOLN
30.0000 mg | Freq: Four times a day (QID) | INTRAMUSCULAR | Status: AC | PRN
  Administered 2011-07-14 (×2): 30 mg via INTRAVENOUS
  Filled 2011-07-14: qty 1

## 2011-07-14 MED ORDER — BUPIVACAINE HCL (PF) 0.25 % IJ SOLN
INTRAMUSCULAR | Status: DC | PRN
  Administered 2011-07-14: 30 mL

## 2011-07-14 MED ORDER — DIBUCAINE 1 % RE OINT: 1.0000 "application " | TOPICAL_OINTMENT | RECTAL | Status: DC | PRN

## 2011-07-14 MED ORDER — SIMETHICONE 80 MG PO CHEW: 80.0000 mg | CHEWABLE_TABLET | ORAL | Status: DC | PRN

## 2011-07-14 MED ORDER — ONDANSETRON HCL 4 MG/2ML IJ SOLN
INTRAMUSCULAR | Status: AC
  Filled 2011-07-14: qty 2

## 2011-07-14 MED ORDER — OXYTOCIN 20 UNITS IN LACTATED RINGERS INFUSION - SIMPLE
INTRAVENOUS | Status: AC
  Administered 2011-07-14: 15:00:00
  Filled 2011-07-14: qty 1000

## 2011-07-14 SURGICAL SUPPLY — 48 items
ADH SKN CLS APL DERMABOND .7 (GAUZE/BANDAGES/DRESSINGS)
APL SKNCLS STERI-STRIP NONHPOA (GAUZE/BANDAGES/DRESSINGS)
BENZOIN TINCTURE PRP APPL 2/3 (GAUZE/BANDAGES/DRESSINGS) IMPLANT
BLADE EXTENDED COATED 6.5IN (ELECTRODE) IMPLANT
BLADE HEX COATED 2.75 (ELECTRODE) IMPLANT
BOOTIES KNEE HIGH SLOAN (MISCELLANEOUS) ×6 IMPLANT
CHLORAPREP W/TINT 26ML (MISCELLANEOUS) ×3 IMPLANT
CLOTH BEACON ORANGE TIMEOUT ST (SAFETY) ×3 IMPLANT
CONTAINER PREFILL 10% NBF 15ML (MISCELLANEOUS) ×4 IMPLANT
DERMABOND ADVANCED (GAUZE/BANDAGES/DRESSINGS)
DERMABOND ADVANCED .7 DNX12 (GAUZE/BANDAGES/DRESSINGS) IMPLANT
DRAIN JACKSON PRT FLT 7MM (DRAIN) IMPLANT
DRESSING TELFA 8X3 (GAUZE/BANDAGES/DRESSINGS) ×1 IMPLANT
ELECT REM PT RETURN 9FT ADLT (ELECTROSURGICAL) ×3
ELECTRODE REM PT RTRN 9FT ADLT (ELECTROSURGICAL) ×2 IMPLANT
EVACUATOR SILICONE 100CC (DRAIN) IMPLANT
EXTRACTOR VACUUM KIWI (MISCELLANEOUS) IMPLANT
EXTRACTOR VACUUM M CUP 4 TUBE (SUCTIONS) IMPLANT
GAUZE SPONGE 4X4 12PLY STRL LF (GAUZE/BANDAGES/DRESSINGS) ×4 IMPLANT
GLOVE SURG SS PI 6.5 STRL IVOR (GLOVE) ×6 IMPLANT
GOWN PREVENTION PLUS LG XLONG (DISPOSABLE) ×9 IMPLANT
KIT ABG SYR 3ML LUER SLIP (SYRINGE) IMPLANT
NDL HYPO 25X5/8 SAFETYGLIDE (NEEDLE) ×2 IMPLANT
NDL SPNL 22GX3.5 QUINCKE BK (NEEDLE) ×2 IMPLANT
NEEDLE HYPO 25X5/8 SAFETYGLIDE (NEEDLE) ×3 IMPLANT
NEEDLE SPNL 22GX3.5 QUINCKE BK (NEEDLE) IMPLANT
NS IRRIG 1000ML POUR BTL (IV SOLUTION) ×3 IMPLANT
PACK C SECTION WH (CUSTOM PROCEDURE TRAY) ×3 IMPLANT
PAD ABD 7.5X8 STRL (GAUZE/BANDAGES/DRESSINGS) ×1 IMPLANT
SLEEVE SCD COMPRESS KNEE MED (MISCELLANEOUS) ×1 IMPLANT
SPONGE GAUZE 4X4 12PLY (GAUZE/BANDAGES/DRESSINGS) ×1 IMPLANT
STRIP CLOSURE SKIN 1/4X4 (GAUZE/BANDAGES/DRESSINGS) IMPLANT
SUT CHROMIC 2 0 SH (SUTURE) ×2 IMPLANT
SUT MNCRL AB 3-0 PS2 27 (SUTURE) ×3 IMPLANT
SUT SILK 0 FSL (SUTURE) IMPLANT
SUT VIC AB 0 CT1 27 (SUTURE) ×6
SUT VIC AB 0 CT1 27XBRD ANBCTR (SUTURE) ×4 IMPLANT
SUT VIC AB 0 CT1 36 (SUTURE) IMPLANT
SUT VIC AB 0 CTXB 36 (SUTURE) ×2 IMPLANT
SUT VIC AB 2-0 CT1 27 (SUTURE) ×6
SUT VIC AB 2-0 CT1 TAPERPNT 27 (SUTURE) ×4 IMPLANT
SUT VIC AB 2-0 SH 27 (SUTURE)
SUT VIC AB 2-0 SH 27XBRD (SUTURE) IMPLANT
SYR CONTROL 10ML LL (SYRINGE) ×3 IMPLANT
TAPE CLOTH SURG 4X10 WHT LF (GAUZE/BANDAGES/DRESSINGS) ×1 IMPLANT
TOWEL OR 17X24 6PK STRL BLUE (TOWEL DISPOSABLE) ×6 IMPLANT
TRAY FOLEY CATH 14FR (SET/KITS/TRAYS/PACK) ×3 IMPLANT
WATER STERILE IRR 1000ML POUR (IV SOLUTION) ×3 IMPLANT

## 2011-07-14 NOTE — H&P (Signed)
Monica [name] is a 31 y.o. female presenting for repeart cesarean section at 39 wks, denies srom, vag bleeding, with +FM.  Pg significant for: Hx of C/S x2 Major depression sees [place] at [facility] [employer] [facility] PP depression HX PP HTN x 2 Tobacco use  Allergy: Latex-Itchy  Meds: Respiradol 1.2 mg HS            benadryl 1 tablet at HS            PNV History OB History    Grav Para Term Preterm Abortions TAB SAB Ect Mult Living   5 2 2  2  0 2 0 0 2    03 SAB 04 SAB 3/07 F 7#11 42 NR FHTS C/S 10./10 m 8#5 40 C./S Current pg uncomplicated Past Medical History  Diagnosis Date  . No pertinent past medical history   . Anxiety   . History of pyelonephritis   . History of HPV infection   . Mild dysplasia of cervix (CIN I)   . H/O varicella   . Bacterial vaginosis   . Hypertension   . Anemia   . Hyperemesis arising during pregnancy 2010  . Genital warts 2002  . PTSD (post-traumatic stress disorder)   . Depression   . Bipolar affective disorder    Past Surgical History  Procedure Date  . Wisdom tooth extraction   . Cesarean section 2007 & 2010   Family History: family history includes Asthma in her paternal grandmother; COPD in her paternal grandfather; Cancer in her paternal aunt and paternal grandmother; Depression in her brother, cousin, father, and mother; Diabetes in her mother and paternal grandmother; Heart disease in her maternal grandfather, paternal aunt, paternal grandfather, and paternal uncle; Hypertension in her cousin, maternal aunt, maternal grandfather, and mother; Kidney disease in her paternal grandmother; and Suicidality in her cousin. Social History:  reports that she quit smoking about 8 months ago. Her smoking use included Cigarettes. She has a 5 pack-year smoking history. She has never used smokeless tobacco. She reports that she uses illicit drugs (Marijuana) about twice per week. She reports that she does not drink alcohol.  ROS    Blood  pressure 117/84, pulse 116, temperature 98.1 F (36.7 C), temperature source Oral, resp. rate 18, last menstrual period 09/29/2010, SpO2 99.00%. Exam Physical Exam alert, calm, no distress, lungs clear bilaterally, AP RRR, abd soft, gravid nt, Bowel sounds active, FHTS +, no edema lower legs. Prenatal labs: ABO, Rh: --/--/O POS (04/04 1440) Antibody: NEG (04/04 1433) Rubella: Immune (08/23 1522) RPR: NON REACTIVE (04/04 1433)  HBsAg: Negative (08/23 1522)  HIV: Non-reactive (08/23 1522)  GBS:   neg from 06-29-11 1 gtt 78 Assessment/Plan: Term IUP for repeat cesarean section Bipolar disorder with recent acute exacerbation, now controlled on medication. Hx abuse and PTSD Discussed risks benefits of c/s risks bleeding, infection, and injury to bowel or bladder, verbalized understanding. Collaboratrion with Dr. Pennie Rushing. Pt had previously planned adoption of this infant, but is now planning to keep infant and to breastfeed if her medication will allow it.   [name], Monica 07/14/2011, 9:02 AM

## 2011-07-14 NOTE — Consult Note (Signed)
Requested to attend repeat C/S at 39 weeks with a complex history of mental illness and psychotropic medications. At delivery with a single loose nuchal cord. infant in vertex, manually expressed. Given vigorous tactile stimulation with drying and bulb suction to naso/oropharynx yielding small amounts of clear fluid. No dysmophic features.    Shown to mother and then care transferred to RN and to assigned pediatrician .   Dagoberto Ligas MD [facility]

## 2011-07-14 NOTE — Anesthesia Postprocedure Evaluation (Signed)
  Anesthesia Post-op Note  Patient: Monica [name]  Procedure(s) Performed: Procedure(s) (LRB): CESAREAN SECTION (N/A) SCAR REVISION ()  Patient Location: Mother/Baby  Anesthesia Type: Spinal  Level of Consciousness: awake  Airway and Oxygen Therapy: Patient Spontanous Breathing  Post-op Pain: mild  Post-op Assessment: Patient's Cardiovascular Status Stable and Respiratory Function Stable  Post-op Vital Signs: stable  Complications: No apparent anesthesia complications

## 2011-07-14 NOTE — Op Note (Signed)
Cesarean Section Procedure Note  Indications: [redacted] weeks gestation prior cesarean section desires repeat  Pre-operative Diagnosis: 39 week 0 day pregnancy.  Post-operative Diagnosis: same  Surgeon: Hal Morales  First Assistant:  Surgeon: Hal Morales   Assistants: Lavera Guise CNM  Anesthesia: Spinal anesthesia  ASA Class: 2  Procedure Details  The patient was seen in the Holding Room. The risks, benefits, complications, treatment options, and expected outcomes were discussed with the patient.  The patient concurred with the proposed plan, giving informed consent.  The site of surgery properly noted/marked. The patient was taken to Operating Room # 9, identified as Monica [name] and the procedure verified as C-Section Delivery. A Time Out was held and the above information confirmed.  After induction of anesthesia, the patient was  prepped withChloraPrep in the usual sterile manner.A foley catheter was placed under sterile conditions.  The patient was then draped in the usual fashion.   Suprapubicsubcutaneous injection of 0.25% Bupivacaine   A Pfannenstiel incision, was made,the old incision removed sharply,, and the incision and carried down through the subcutaneous tissue to the fascia. Fascial incision was made and extended transversely. The fascia was separated from the underlying rectus tissue superiorly and inferiorly. The peritoneum was identified and entered. Peritoneal incision was extended longitudinally.  The bladder blade was placed.  A bladder flap was created with incision of the serosa and sharp dissection of the bladder off the anterior lower uterine segment. A low transverse uterine incision was made two cm above the uterovesical fold, and that incision extended transversely bluntly.The infant was  delivered from OT presentation was a 3310 gram FEMALE with Apgar scores of 9 at one minute and 10 at five minutes. After the umbilical cord was clamped and cut  cord blood was obtained for evaluation. The placenta was removed intact and appeared normal. The uterine outline, tubes and ovaries appeared norma for the gravid state. The uterine incision was closed with running locked sutures of 0 Vicryl. An imbricating layer of sutures was placed. Hemostasis was observed. Lavage was carried out until clear. The peritoneum was closed with a running suture of 2-0 Vicryl.  The rectus muscles were reapproximated with a figure of 8 suture of 2-0 Vicryl.  The fascia was then reapproximated with a running sutures of 0 Vicryl .Renforcing figure of 8 sutures of 0Vicryl were placed on either side of midline.   The skin was reapproximated with 2-0 silk3-0 moncryl.  A sterile dressing was applied.  Instrument, sponge, and needle counts were correct prior to the abdominal closure and at the conclusion of the case.   Findings:  Placenta contained a 3vessel cord Tubes and ovaries were normal for the gravid state   Estimated Blood Loss:  750         Drains: NONE         Total IV Fluids:         Specimens: PLACENTA         Implants: NONE        Complications: ::"None; patient tolerated the procedure well."         Disposition: PACU - hemodynamically stable.         Condition: stable  Attending Attestation: I performed the procedure.  Malkie Wille P  07/14/2011 1:13 PM

## 2011-07-14 NOTE — Anesthesia Preprocedure Evaluation (Addendum)

## 2011-07-14 NOTE — Transfer of Care (Signed)
Immediate Anesthesia Transfer of Care Note  Patient: Monica [name]  Procedure(s) Performed: Procedure(s) (LRB): CESAREAN SECTION (N/A) SCAR REVISION ()  Patient Location: PACU  Anesthesia Type: Spinal  Level of Consciousness: oriented  Airway & Oxygen Therapy: Patient Spontanous Breathing  Post-op Assessment: Report given to PACU RN and Post -op Vital signs reviewed and stable  Post vital signs: stable  Complications: No apparent anesthesia complications

## 2011-07-14 NOTE — Anesthesia Procedure Notes (Signed)
Spinal  Patient location during procedure: OR Preanesthetic Checklist Completed: patient identified, site marked, surgical consent, pre-op evaluation, timeout performed, IV checked, risks and benefits discussed and monitors and equipment checked Spinal Block Patient position: sitting Prep: DuraPrep Patient monitoring: heart rate, cardiac monitor, continuous pulse ox and blood pressure Approach: midline Location: L3-4 Injection technique: single-shot Needle Needle type: Sprotte  Needle gauge: 24 G Needle length: 9 cm Assessment Sensory level: T4 Additional Notes Spinal Dosage in OR  Bupivicaine ml       1.7 PFMS04   mcg        150 Fentanyl mcg            25    

## 2011-07-15 LAB — PROTEIN, URINE, 24 HOUR
Protein, 24H Urine: 147 mg/d — ABNORMAL HIGH (ref 50–100)
Protein, Urine: 6 mg/dL

## 2011-07-15 LAB — CBC
Hemoglobin: 8.5 g/dL — ABNORMAL LOW (ref 12.0–15.0)
MCH: 30.4 pg (ref 26.0–34.0)
MCHC: 32.5 g/dL (ref 30.0–36.0)
Platelets: 136 10*3/uL — ABNORMAL LOW (ref 150–400)
RBC: 2.83 MIL/uL — ABNORMAL LOW (ref 3.87–5.11)

## 2011-07-15 NOTE — Progress Notes (Signed)
Subjective: Postpartum Day 1: Cesarean Delivery Patient reports tolerating PO. Ambulating well   Objective: Vital signs in last 24 hours: Temp:  [97.6 F (36.4 C)-98.3 F (36.8 C)] 97.6 F (36.4 C) (04/06 0915) Pulse Rate:  [70-98] 82  (04/06 0915) Resp:  [16-20] 16  (04/06 0915) BP: (105-122)/(64-84) 113/81 mmHg (04/06 0915) SpO2:  [96 %-98 %] 96 % (04/06 0915) Weight:  [200 lb (90.719 kg)] 200 lb (90.719 kg) (04/05 1418)  Physical Exam:  General: alert and no distress Lochia: appropriate Uterine Fundus: firm Incision: dressing dry DVT Evaluation: No evidence of DVT seen on physical exam.   Basename 07/15/11 0533 07/13/11 1433  HGB 8.5* 10.5*  HCT 26.5* 32.7*    Assessment/Plan: Status post Cesarean section. Doing well postoperatively. Asymptomatic anemia consistent with intraoperative blood loss. Continue current care.  Karalee Hauter P 07/15/2011, 1:51 PM
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
## 2011-07-16 MED ORDER — DIPHENHYDRAMINE HCL 25 MG PO CAPS
25.0000 mg | ORAL_CAPSULE | Freq: Four times a day (QID) | ORAL | Status: DC | PRN
  Administered 2011-07-16 (×2): 25 mg via ORAL
  Filled 2011-07-16 (×2): qty 1

## 2011-07-16 MED ORDER — FERROUS SULFATE 325 (65 FE) MG PO TABS
325.0000 mg | ORAL_TABLET | Freq: Three times a day (TID) | ORAL | Status: DC
  Administered 2011-07-16 – 2011-07-17 (×3): 325 mg via ORAL
  Filled 2011-07-16 (×3): qty 1

## 2011-07-16 NOTE — Progress Notes (Addendum)
Post Partum Day 2 Subjective: up ad lib, voiding, tolerating PO and + flatus. Stable mood. Reports itching. Requesting higher dose of Benadryl . Has been taking 50 mg Q6 prior to admission for itching from Risperdal. Itching no worse than baseline and less than it was yesterday. Denies SOB, hives or swelling of mouth or tongue.    Objective: Blood pressure 134/74, pulse 99, temperature 98.2 F (36.8 C), temperature source Oral, resp. rate 18, weight 90.719 kg (200 lb), last menstrual period 09/29/2010, SpO2 96.00%, unknown if currently breastfeeding.  Physical Exam:  General: alert, cooperative and no distress Lochia: appropriate Uterine Fundus: firm Incision: healing well, no significant drainage, no dehiscence, no significant erythema DVT Evaluation: Negative Homan's sign. 1+ pedal edema. Heart: RRR Lungs: CTAB   Basename 07/15/11 0533 07/13/11 1433  HGB 8.5* 10.5*  HCT 26.5* 32.7*    Assessment/Plan: Plan for discharge tomorrow, Breastfeeding and Contraception POPs Pruritis from Medication w/out concern for significant allergic reaction  Benadryl 25-50 mg Q6.   LOS: 2 days   Dorathy Kinsman 07/16/2011, 8:53 AM

## 2011-07-16 NOTE — Progress Notes (Signed)
Met with bedside RN to follow up about patient.  Patient reportedly doing well.  Maternal grandparents had been to visit patient and RN observed good engagement and family interaction.  Family was visiting when I went to check in with MOB to see how she was doing.  MOB and family all looked happy and engaged and I did not stay in the interest of privacy. Staci Acosta, LCSW 07/16/2011, 5:52 pm

## 2011-07-17 ENCOUNTER — Encounter (HOSPITAL_COMMUNITY): Payer: Self-pay | Admitting: Obstetrics and Gynecology

## 2011-07-17 DIAGNOSIS — Z98891 History of uterine scar from previous surgery: Secondary | ICD-10-CM

## 2011-07-17 MED ORDER — FERROUS SULFATE 325 (65 FE) MG PO TABS: 325.0000 mg | ORAL_TABLET | Freq: Three times a day (TID) | ORAL | Status: DC

## 2011-07-17 MED ORDER — IBUPROFEN 600 MG PO TABS: 600.0000 mg | ORAL_TABLET | Freq: Four times a day (QID) | ORAL | Status: AC | PRN

## 2011-07-17 MED ORDER — OXYCODONE-ACETAMINOPHEN 5-325 MG PO TABS: 1.0000 | ORAL_TABLET | ORAL | Status: AC | PRN

## 2011-07-17 MED ORDER — HYDROCHLOROTHIAZIDE 25 MG PO TABS: 25.0000 mg | ORAL_TABLET | Freq: Every day | ORAL | Status: DC

## 2011-07-17 NOTE — Discharge Instructions (Signed)
Cesarean Delivery  Cesarean delivery is the birth of a baby through a cut (incision) in the abdomen and womb (uterus).  LET YOUR CAREGIVER KNOW ABOUT:  Complicationsinvolving the pregnancy.   Allergies.   Medicines taken including herbs, eyedrops, over-the-counter medicines, and creams.   Use of steroids (by mouth or creams).   Previous problems with anesthetics or numbing medicine.   Previous surgery.   History of blood clots.   History of bleeding or blood problems.   Other health problems Postpartum Depression and Baby Blues The postpartum period begins right after the birth of a baby. During this time, there is often a great amount of joy and excitement. It is also a time of considerable changes in the life of the parent(s). Regardless of how many times a mother gives birth, each child brings new challenges and dynamics to the family. It is not unusual to have feelings of excitement accompanied by confusing shifts in moods, emotions, and thoughts. All mothers are at risk of developing postpartum depression or the "baby blues." These mood changes can occur right after giving birth, or they may occur many months after giving birth. The baby blues or postpartum depression can be mild or severe. Additionally, postpartum depression can resolve rather quickly, or it can be a long-term condition. CAUSES Elevated hormones and their rapid decline are thought to be a main cause of postpartum depression and the baby blues. There are a number of hormones that radically change during and after pregnancy. Estrogen and progesterone usually decrease immediately after delivering your baby. The level of thyroid hormone and various cortisol steroids also rapidly drop. Other factors that play a major role in these changes include major life events and genetics.  RISK FACTORS If you have any of the following risks for the baby blues or postpartum depression, know what symptoms to watch out for during the  postpartum period. Risk factors that may increase the likelihood of getting the baby blues or postpartum depression include: Havinga personal or family history of depression.  Having depression while being pregnant.  Having premenstrual or oral contraceptive-associated mood issues.  Having exceptional life stress.  Having marital conflict.  Lacking a social support network.  Having a baby with special needs.  Having health problems such as diabetes.  SYMPTOMS Baby blues symptoms include: Brief fluctuations in mood, such as going from extreme happiness to sadness.  Decreased concentration.  Difficulty sleeping.  Crying spells, tearfulness.  Irritability.  Anxiety.  Postpartum depression symptoms typically begin within the first month after giving birth. These symptoms include: Difficulty sleeping or excessive sleepiness.  Marked weight loss.  Agitation.  Feelings of worthlessness.  Lack of interest in activity or food.  Postpartum psychosis is a very concerning condition and can be dangerous. Fortunately, it is rare. Displaying any of the following symptoms is cause for immediate medical attention. Postpartum psychosis symptoms include: Hallucinations and delusions.  Bizarre or disorganized behavior.  Confusion or disorientation.  DIAGNOSIS  A diagnosis is made by an evaluation of your symptoms. There are no medical or lab tests that lead to a diagnosis, but there are various questionnaires that a caregiver may use to identify those with the baby blues, postpartum depression, or psychosis. Often times, a screening tool called the [place] Postnatal Depression Scale is used to diagnose depression in the postpartum period.  TREATMENT The baby blues usually goes away on its own in 1 to 2 weeks. Social support is often all that is needed. You should be encouraged to  get adequate sleep and rest. Occasionally, you may be given medicines to help you sleep.  Postpartum depression requires  treatment as it can last several months or longer if it is not treated. Treatment may include individual or group therapy, medicine, or both to address any social, physiological, and psychological factors that may play a role in the depression. Regular exercise, a healthy diet, rest, and social support may also be strongly recommended.  Postpartum psychosis is more serious and needs treatment right away. Hospitalization is often needed. HOME CARE INSTRUCTIONS Get as much rest as you can. Nap when the baby sleeps.  Exercise regularly. Some women find yoga and walking to be beneficial.  Eat a balanced and nourishing diet.  Do little things that you enjoy. Have a cup of tea, take a bubble bath, read your favorite magazine, or listen to your favorite music.  Avoid alcohol.  Ask for help with household chores, cooking, grocery shopping, or running errands as needed. Do not try to do everything.  Talk to people close to you about how you are feeling. Get support from your partner, family members, friends, or other new moms.  Try to stay positive in how you think. Think about the things you are grateful for.  Do not spend a lot of time alone.  Only take medicine as directed by your caregiver.  Keep all your postpartum appointments.  Let your caregiver know if you have any concerns.  SEEK MEDICAL CARE IF: You are having a reaction or problems with your medicine. SEEK IMMEDIATE MEDICAL CARE IF: You have suicidal feelings.  You feel you may harm the baby or someone else.  Document Released: 12/30/2003 Document Revised: 03/16/2011 Document Reviewed: 01/31/2011  [facility] Patient Information 2012 [place], [place]..  Breastfeeding BENEFITS OF BREASTFEEDING For the baby The first milk (colostrum) helps the baby's digestive system function better.  There are antibodies from the mother in the milk that help the baby fight off infections.  The baby has a lower incidence of asthma, allergies, and SIDS  (sudden infant death syndrome).  The nutrients in breast milk are better than formulas for the baby and helps the baby's brain grow better.  Babies who breastfeed have less gas, colic, and constipation.  For the mother Breastfeeding helps develop a very special bond between mother and baby.  It is more convenient, always available at the correct temperature and cheaper than formula feeding.  It burns calories in the mother and helps with losing weight that was gained during pregnancy.  It makes the uterus contract back down to normal size faster and slows bleeding following delivery.  Breastfeeding mothers have a lower risk of developing breast cancer.  NURSE FREQUENTLY A healthy, full-term baby may breastfeed as often as every hour or space his or her feedings to every 3 hours.  How often to nurse will vary from baby to baby. Watch your baby for signs of hunger, not the clock.  Nurse as often as the baby requests, or when you feel the need to reduce the fullness of your breasts.  Awaken the baby if it has been 3 to 4 hours since the last feeding.  Frequent feeding will help the mother make more milk and will prevent problems like sore nipples and engorgement of the breasts.  BABY'S POSITION AT THE BREAST Whether lying down or sitting, be sure that the baby's tummy is facing your tummy.  Support the breast with 4 fingers underneath the breast and the thumb above. Make sure  your fingers are well away from the nipple and baby's mouth.  Stroke the baby's lips and cheek closest to the breast gently with your finger or nipple.  When the baby's mouth is open wide enough, place all of your nipple and as much of the dark area around the nipple as possible into your baby's mouth.  Pull the baby in close so the tip of the nose and the baby's cheeks touch the breast during the feeding.  FEEDINGS The length of each feeding varies from baby to baby and from feeding to feeding.  The baby must suck about 2  to 3 minutes for your milk to get to him or her. This is called a "let down." For this reason, allow the baby to feed on each breast as long as he or she wants. Your baby will end the feeding when he or she has received the right balance of nutrients.  To break the suction, put your finger into the corner of the baby's mouth and slide it between his or her gums before removing your breast from his or her mouth. This will help prevent sore nipples.  REDUCING BREAST ENGORGEMENT In the first week after your baby is born, you may experience signs of breast engorgement. When breasts are engorged, they feel heavy, warm, full, and may be tender to the touch. You can reduce engorgement if you:  Nurse frequently, every 2 to 3 hours. Mothers who breastfeed early and often have fewer problems with engorgement.  Place light ice packs on your breasts between feedings. This reduces swelling. Wrap the ice packs in a lightweight towel to protect your skin.  Apply moist hot packs to your breast for 5 to 10 minutes before each feeding. This increases circulation and helps the milk flow.  Gently massage your breast before and during the feeding.  Make sure that the baby empties at least one breast at every feeding before switching sides.  Use a breast pump to empty the breasts if your baby is sleepy or not nursing well. You may also want to pump if you are returning to work or or you feel you are getting engorged.  Avoid bottle feeds, pacifiers or supplemental feedings of water or juice in place of breastfeeding.  Be sure the baby is latched on and positioned properly while breastfeeding.  Prevent fatigue, stress, and anemia.  Wear a supportive bra, avoiding underwire styles.  Eat a balanced diet with enough fluids.  If you follow these suggestions, your engorgement should improve in 24 to 48 hours. If you are still experiencing difficulty, call your lactation consultant or caregiver. IS MY BABY GETTING ENOUGH  MILK? Sometimes, mothers worry about whether their babies are getting enough milk. You can be assured that your baby is getting enough milk if: The baby is actively sucking and you hear swallowing.  The baby nurses at least 8 to 12 times in a 24 hour time period. Nurse your baby until he or she unlatches or falls asleep at the first breast (at least 10 to 20 minutes), then offer the second side.  The baby is wetting 5 to 6 disposable diapers (6 to 8 cloth diapers) in a 24 hour period by 81 to 72 days of age.  The baby is having at least 2 to 3 stools every 24 hours for the first few months. Breast milk is all the food your baby needs. It is not necessary for your baby to have water or formula. In fact, to  help your breasts make more milk, it is best not to give your baby supplemental feedings during the early weeks.  The stool should be soft and yellow.  The baby should gain 4 to 7 ounces per week after he is 76 days old.  TAKE CARE OF YOURSELF Take care of your breasts by: Bathing or showering daily.  Avoiding the use of soaps on your nipples.  Start feedings on your left breast at one feeding and on your right breast at the next feeding.  You will notice an increase in your milk supply 2 to 5 days after delivery. You may feel some discomfort from engorgement, which makes your breasts very firm and often tender. Engorgement "peaks" out within 24 to 48 hours. In the meantime, apply warm moist towels to your breasts for 5 to 10 minutes before feeding. Gentle massage and expression of some milk before feeding will soften your breasts, making it easier for your baby to latch on. Wear a well fitting nursing bra and air dry your nipples for 10 to 15 minutes after each feeding.  Only use cotton bra pads.  Only use pure lanolin on your nipples after nursing. You do not need to wash it off before nursing.  Take care of yourself by:  Eating well-balanced meals and nutritious snacks.  Drinking milk, fruit  juice, and water to satisfy your thirst (about 8 glasses a day).  Getting plenty of rest.  Increasing calcium in your diet (1200 mg a day).  Avoiding foods that you notice affect the baby in a bad way.  SEEK MEDICAL CARE IF:  You have any questions or difficulty with breastfeeding.  You need help.  You have a hard, red, sore area on your breast, accompanied by a fever of 100.5 F (38.1 C) or more.  Your baby is too sleepy to eat well or is having trouble sleeping.  Your baby is wetting less than 6 diapers per day, by 92 days of age.  Your baby's skin or white part of his or her eyes is more yellow than it was in the hospital.  You feel depressed.  Document Released: 03/27/2005 Document Revised: 03/16/2011 Document Reviewed: 11/09/2008 [facility] Patient Information 2012 [place], [place]. RISKS AND COMPLICATIONS   Bleeding.   Infection.   Blood clots.   Injury to surrounding organs.   Anesthesia problems.   Injury to the baby.  BEFORE THE PROCEDURE   A tube (Foley catheter) will be placed in your bladder. The Foley catheter drains the urine from your bladder into a bag. This keeps your bladder empty during surgery.   An intravenous access tube (IV) will be placed in your arm.   Hair may be removed from your pubic area and your lower abdomen. This is to prevent infection in the incision site.   You may be given an antacid medicine to drink. This will prevent acid contents in your stomach from going into your lungs if you vomit during the surgery.   You may be given an antibiotic medicine to prevent infection.  PROCEDURE   You may be given medicine to numb the lower half of your body (regional anesthetic). If you were in labor, you may have already had an epidural in place which can be used in both labor and cesarean delivery. You may possibly be given medicine to make you sleep (general anesthetic) though this is not as common.   An incision will be made in your abdomen that  extends to your uterus.  There are 2 basic kinds of incisions:   The horizontal (transverse) incision. Horizontal incisions are used for most routine cesarean deliveries.   The vertical (up and down) incision. This is less commonly used. This is most often reserved for women who have a serious complication (extreme prematurity) or under emergency situations.   The horizontal and vertical incisions may both be used at the same time. However, this is very uncommon.   Your baby will then be delivered.  AFTER THE PROCEDURE   If you were awake during the surgery, you will see your baby right away. If you were asleep, you will see your baby as soon as you are awake.   You may breastfeed your baby after surgery.   You may be able to get up and walk the same day as the surgery. If you need to stay in bed for a period of time, you will receive help to turn, cough, and take deep breaths after surgery. This helps prevent lung problems such as pneumonia.   Do not get out of bed alone the first time after surgery. You will need help getting out of bed until you are able to do this by yourself.   You may be able to shower the day after your cesarean delivery. After the bandage (dressing) is taken off the incision site, a nurse will assist you to shower, if you like.   You will have pneumatic compressing hose placed on your feet or lower legs. These hose are used to prevent blood clots. When you are up and walking regularly, they will no longer be necessary.   Do not cross your legs when you sit.   Save any blood clots that you pass. If you pass a clot while on the toilet, do not flush it. Call for the nurse. Tell the nurse if you think you are bleeding too much or passing too many clots.   Start drinking liquids and eating food as directed by your caregiver. If your stomach is not ready, drinking and eating too soon can cause an increase in bloating and swelling of your intestine and abdomen. This is  very uncomfortable.   You will be given medicine as needed. Let your caregivers know if you are hurting. They want you to be comfortable. You may also be given an antibiotic to prevent an infection.   Your IV will be taken out when you are drinking a reasonable amount of fluids. The Foley catheter is taken out when you are up and walking.   If your blood type is Rh negative and your baby's blood type is Rh positive, you will be given a shot of anti-D immune globulin. This shot prevents you from having Rh problems with a future pregnancy. You should get the shot even if you had your tubes tied (tubal ligation).   If you are allowed to take the baby for a walk, place the baby in the bassinet and push it. Do not carry your baby in your arms.  Document Released: 03/27/2005 Document Revised: 03/16/2011 Document Reviewed: 07/22/2010 [facility] Patient Information 2012 [place], [place].
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
## 2011-07-18 ENCOUNTER — Encounter: Payer: Self-pay | Admitting: Obstetrics and Gynecology

## 2011-07-21 ENCOUNTER — Ambulatory Visit (INDEPENDENT_AMBULATORY_CARE_PROVIDER_SITE_OTHER): Payer: Managed Care, Other (non HMO) | Admitting: Licensed Clinical Social Worker

## 2011-07-21 DIAGNOSIS — F319 Bipolar disorder, unspecified: Secondary | ICD-10-CM | POA: Insufficient documentation
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
## 2011-07-26 ENCOUNTER — Ambulatory Visit (HOSPITAL_COMMUNITY): Payer: Managed Care, Other (non HMO) | Admitting: Physician Assistant

## 2011-08-03 ENCOUNTER — Other Ambulatory Visit (HOSPITAL_COMMUNITY): Payer: Self-pay

## 2011-08-03 ENCOUNTER — Encounter (HOSPITAL_COMMUNITY): Payer: Self-pay | Admitting: Licensed Clinical Social Worker

## 2011-08-03 ENCOUNTER — Ambulatory Visit (INDEPENDENT_AMBULATORY_CARE_PROVIDER_SITE_OTHER): Payer: Managed Care, Other (non HMO) | Admitting: Licensed Clinical Social Worker

## 2011-08-03 DIAGNOSIS — F3130 Bipolar disorder, current episode depressed, mild or moderate severity, unspecified: Secondary | ICD-10-CM

## 2011-08-03 DIAGNOSIS — F319 Bipolar disorder, unspecified: Secondary | ICD-10-CM
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
## 2011-08-10 ENCOUNTER — Ambulatory Visit (HOSPITAL_COMMUNITY): Payer: Self-pay | Admitting: Licensed Clinical Social Worker

## 2011-08-10 ENCOUNTER — Encounter (HOSPITAL_COMMUNITY): Payer: Self-pay

## 2011-08-10 ENCOUNTER — Encounter (HOSPITAL_COMMUNITY): Payer: Self-pay | Admitting: Licensed Clinical Social Worker

## 2011-08-10 NOTE — Progress Notes (Signed)
Patient ID: Monica [name], female   DOB: Jan 25, 1981, 31 y.o.   MRN: [id number] Patient was a no show/no call for today's appointment.

## 2011-08-11 ENCOUNTER — Ambulatory Visit (HOSPITAL_COMMUNITY): Payer: Self-pay | Admitting: Psychiatry

## 2011-08-11 ENCOUNTER — Encounter (HOSPITAL_COMMUNITY): Payer: Self-pay

## 2011-08-15 ENCOUNTER — Ambulatory Visit (HOSPITAL_COMMUNITY): Payer: Self-pay | Admitting: Licensed Clinical Social Worker

## 2011-08-17 ENCOUNTER — Encounter: Payer: Self-pay | Admitting: Obstetrics and Gynecology

## 2011-08-21 ENCOUNTER — Encounter: Payer: Self-pay | Admitting: Obstetrics and Gynecology

## 2011-08-21 ENCOUNTER — Ambulatory Visit (INDEPENDENT_AMBULATORY_CARE_PROVIDER_SITE_OTHER): Payer: Managed Care, Other (non HMO) | Admitting: Obstetrics and Gynecology

## 2011-08-21 VITALS — BP 110/74 | Temp 98.7°F | Ht 67.0 in | Wt 175.0 lb

## 2011-08-21 DIAGNOSIS — F319 Bipolar disorder, unspecified: Secondary | ICD-10-CM

## 2011-08-21 DIAGNOSIS — Z98891 History of uterine scar from previous surgery: Secondary | ICD-10-CM

## 2011-08-21 DIAGNOSIS — Z9889 Other specified postprocedural states: Secondary | ICD-10-CM

## 2011-08-21 MED ORDER — RISPERIDONE 1 MG PO TABS: 1.0000 mg | ORAL_TABLET | Freq: Every day | ORAL | Status: DC

## 2011-08-21 MED ORDER — RISPERIDONE 0.25 MG PO TABS: 0.2500 mg | ORAL_TABLET | Freq: Every day | ORAL | Status: DC

## 2011-08-21 MED ORDER — LEVONORGESTREL-ETHINYL ESTRAD 0.1-20 MG-MCG PO TABS: 1.0000 | ORAL_TABLET | Freq: Every day | ORAL | Status: DC

## 2011-08-21 NOTE — Progress Notes (Signed)
Date of delivery: 07/14/2011 Female Name: Monica [name] Vaginal delivery:no Cesarean section:yes Tubal ligation:no GDM:yes Breast Feeding:no Bottle Feeding:yes Post-Partum Blues:yes Abnormal pap:yes Normal GU function: yes Normal GI function:yes Returning to work:yes End of June EPDS=15  Entered by cw,cma  Subjective:     Monica [name] is a 31 y.o. female who presents for a postpartum visit.  I have fully reviewed the prenatal and intrapartum course. The pt has a history of bipolar disorder with major depression.  She has not taken her Respiradol for about a week because she has run out and her next appointment at behavioral health is not until next week. She has an EPDS. Score of 15. She denies suicidal ideation.   Patient is not sexually active.   The following portions of the patient's history were reviewed and updated as appropriate: allergies, current medications, past family history, past medical history, past social history, past surgical history and problem list.  Review of Systems Pertinent items are noted in HPI.   Objective:    BP 110/74  Temp 98.7 F (37.1 C)  Wt 175 lb (79.379 kg)  LMP 09/29/2010  Breastfeeding? No  General:  alert, cooperative and no distress     Lungs: clear to auscultation bilaterally  Heart:  regular rate and rhythm, S1, S2 normal, no murmur  Abdomen: soft, non-tender; bowel sounds normal; no masses,  no organomegaly. Incision is well-healed   Vulva:  normal  Vagina: normal vagina  Cervix:  normal  Corpus: normal size, contour, position, consistency, mobility, non-tender  Adnexa:  normal adnexa             Assessment:     Normal postpartum exam.  Pap smear not done at today's visit.  Chronic bipoloar disorder with pregnancy associaated exacerbation  Plan:     1. Contraception: OCP (estrogen/progesterone) Alesse to begin the first day of mensesw 2. Follow up in: July for aex and pap  or as needed.  3.  Will refill pts  Respiradol until her BH appt.  She is incouraged to call [facility] for further refills if appt time is adjusted  Monica [name] P MD 08/21/2011 4:59 PM

## 2011-08-23 ENCOUNTER — Ambulatory Visit: Payer: Managed Care, Other (non HMO) | Admitting: Obstetrics and Gynecology

## 2011-08-28 ENCOUNTER — Encounter (HOSPITAL_COMMUNITY): Payer: Self-pay

## 2011-08-28 ENCOUNTER — Encounter (HOSPITAL_COMMUNITY): Payer: Self-pay | Admitting: Licensed Clinical Social Worker

## 2011-08-28 ENCOUNTER — Ambulatory Visit (HOSPITAL_COMMUNITY): Payer: Self-pay | Admitting: Licensed Clinical Social Worker

## 2011-08-28 NOTE — Progress Notes (Signed)
Patient ID: Monica [name], female   DOB: July 20, 1980, 31 y.o.   MRN: [id number] Patient was a no show/no call for her appointment today.

## 2011-09-20 ENCOUNTER — Ambulatory Visit (INDEPENDENT_AMBULATORY_CARE_PROVIDER_SITE_OTHER): Payer: Managed Care, Other (non HMO) | Admitting: Physician Assistant

## 2011-09-20 DIAGNOSIS — F3160 Bipolar disorder, current episode mixed, unspecified: Secondary | ICD-10-CM

## 2011-09-20 DIAGNOSIS — Z98891 History of uterine scar from previous surgery: Secondary | ICD-10-CM

## 2011-09-20 DIAGNOSIS — F319 Bipolar disorder, unspecified: Secondary | ICD-10-CM

## 2011-09-20 MED ORDER — LAMOTRIGINE 25 MG PO TABS: ORAL_TABLET | ORAL | Status: DC

## 2011-09-20 MED ORDER — RISPERIDONE 2 MG PO TABS: 2.0000 mg | ORAL_TABLET | Freq: Every day | ORAL | Status: DC

## 2011-10-16 ENCOUNTER — Encounter (HOSPITAL_COMMUNITY): Payer: Self-pay | Admitting: Licensed Clinical Social Worker

## 2011-10-16 ENCOUNTER — Ambulatory Visit (HOSPITAL_COMMUNITY): Payer: Self-pay | Admitting: Licensed Clinical Social Worker

## 2011-10-16 NOTE — Progress Notes (Unsigned)
Patient ID: Monica [name], female   DOB: 1981/01/27, 31 y.o.   MRN: [id number] Patient was a no show/no call for appointment today.

## 2011-10-18 ENCOUNTER — Encounter (HOSPITAL_COMMUNITY): Payer: Self-pay | Admitting: Physician Assistant
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
## 2011-11-01 ENCOUNTER — Ambulatory Visit (INDEPENDENT_AMBULATORY_CARE_PROVIDER_SITE_OTHER): Payer: Managed Care, Other (non HMO) | Admitting: Physician Assistant

## 2011-11-01 DIAGNOSIS — F3130 Bipolar disorder, current episode depressed, mild or moderate severity, unspecified: Secondary | ICD-10-CM

## 2011-11-01 DIAGNOSIS — F319 Bipolar disorder, unspecified: Secondary | ICD-10-CM

## 2011-11-01 MED ORDER — GABAPENTIN 300 MG PO CAPS: 300.0000 mg | ORAL_CAPSULE | Freq: Three times a day (TID) | ORAL | Status: DC

## 2011-11-01 MED ORDER — RISPERIDONE 2 MG PO TABS: 2.0000 mg | ORAL_TABLET | Freq: Every day | ORAL | Status: DC

## 2011-11-01 MED ORDER — LAMOTRIGINE 100 MG PO TABS: 100.0000 mg | ORAL_TABLET | Freq: Every day | ORAL | Status: DC
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
## 2011-11-10 ENCOUNTER — Encounter (HOSPITAL_COMMUNITY): Payer: Self-pay | Admitting: Licensed Clinical Social Worker

## 2011-11-10 ENCOUNTER — Ambulatory Visit (INDEPENDENT_AMBULATORY_CARE_PROVIDER_SITE_OTHER): Payer: Managed Care, Other (non HMO) | Admitting: Licensed Clinical Social Worker

## 2011-11-10 DIAGNOSIS — F319 Bipolar disorder, unspecified: Secondary | ICD-10-CM

## 2011-11-10 DIAGNOSIS — F3130 Bipolar disorder, current episode depressed, mild or moderate severity, unspecified: Secondary | ICD-10-CM
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
## 2011-12-01 ENCOUNTER — Encounter (HOSPITAL_COMMUNITY): Payer: Self-pay | Admitting: Licensed Clinical Social Worker

## 2011-12-01 ENCOUNTER — Encounter (HOSPITAL_COMMUNITY): Payer: Self-pay

## 2011-12-01 ENCOUNTER — Ambulatory Visit (HOSPITAL_COMMUNITY): Payer: Self-pay | Admitting: Licensed Clinical Social Worker

## 2011-12-01 NOTE — Progress Notes (Signed)
Patient ID: Monica [name], female   DOB: 1980/07/22, 31 y.o.   MRN: [id number] Patient was a no show no call for appointment.

## 2011-12-28 ENCOUNTER — Ambulatory Visit (INDEPENDENT_AMBULATORY_CARE_PROVIDER_SITE_OTHER): Payer: Managed Care, Other (non HMO) | Admitting: Physician Assistant

## 2011-12-28 ENCOUNTER — Other Ambulatory Visit (HOSPITAL_COMMUNITY): Payer: Self-pay | Admitting: Physician Assistant

## 2011-12-28 DIAGNOSIS — F319 Bipolar disorder, unspecified: Secondary | ICD-10-CM

## 2011-12-28 DIAGNOSIS — F3160 Bipolar disorder, current episode mixed, unspecified: Secondary | ICD-10-CM

## 2011-12-28 MED ORDER — GABAPENTIN 300 MG PO CAPS: 300.0000 mg | ORAL_CAPSULE | Freq: Three times a day (TID) | ORAL | Status: DC

## 2011-12-28 MED ORDER — RISPERIDONE 1 MG PO TABS: 1.0000 mg | ORAL_TABLET | Freq: Every day | ORAL | Status: DC

## 2011-12-28 MED ORDER — LAMOTRIGINE 150 MG PO TABS: 150.0000 mg | ORAL_TABLET | Freq: Every day | ORAL | Status: DC

## 2011-12-29 ENCOUNTER — Other Ambulatory Visit (HOSPITAL_COMMUNITY): Payer: Self-pay | Admitting: Physician Assistant

## 2012-01-01 ENCOUNTER — Encounter (HOSPITAL_COMMUNITY): Payer: Self-pay | Admitting: Licensed Clinical Social Worker

## 2012-01-01 ENCOUNTER — Ambulatory Visit (INDEPENDENT_AMBULATORY_CARE_PROVIDER_SITE_OTHER): Payer: Managed Care, Other (non HMO) | Admitting: Licensed Clinical Social Worker

## 2012-01-01 DIAGNOSIS — F3130 Bipolar disorder, current episode depressed, mild or moderate severity, unspecified: Secondary | ICD-10-CM

## 2012-01-01 DIAGNOSIS — F319 Bipolar disorder, unspecified: Secondary | ICD-10-CM
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
## 2012-01-16 ENCOUNTER — Ambulatory Visit (HOSPITAL_COMMUNITY): Payer: Self-pay | Admitting: Licensed Clinical Social Worker

## 2012-01-25 ENCOUNTER — Encounter (HOSPITAL_COMMUNITY): Payer: Self-pay | Admitting: Licensed Clinical Social Worker

## 2012-01-25 ENCOUNTER — Ambulatory Visit (INDEPENDENT_AMBULATORY_CARE_PROVIDER_SITE_OTHER): Payer: Managed Care, Other (non HMO) | Admitting: Licensed Clinical Social Worker

## 2012-01-25 DIAGNOSIS — F3130 Bipolar disorder, current episode depressed, mild or moderate severity, unspecified: Secondary | ICD-10-CM

## 2012-01-25 DIAGNOSIS — F319 Bipolar disorder, unspecified: Secondary | ICD-10-CM
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
## 2012-01-29 ENCOUNTER — Ambulatory Visit (INDEPENDENT_AMBULATORY_CARE_PROVIDER_SITE_OTHER): Payer: Managed Care, Other (non HMO) | Admitting: Licensed Clinical Social Worker

## 2012-01-29 ENCOUNTER — Telehealth (HOSPITAL_COMMUNITY): Payer: Self-pay

## 2012-01-29 ENCOUNTER — Encounter (HOSPITAL_COMMUNITY): Payer: Self-pay | Admitting: Licensed Clinical Social Worker

## 2012-01-29 DIAGNOSIS — F3130 Bipolar disorder, current episode depressed, mild or moderate severity, unspecified: Secondary | ICD-10-CM

## 2012-01-29 DIAGNOSIS — F319 Bipolar disorder, unspecified: Secondary | ICD-10-CM

## 2012-01-29 NOTE — Telephone Encounter (Signed)
10:19am 10/214/13 pt called requestign to speak with Belenda Cruise - fwd to provider's vm per approval./sh
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
## 2012-01-30 ENCOUNTER — Ambulatory Visit (HOSPITAL_COMMUNITY)
Admission: RE | Admit: 2012-01-30 | Discharge: 2012-01-30 | Disposition: A | Discharge status: Home / Self Care | Payer: Managed Care, Other (non HMO) | Attending: Psychiatry | Admitting: Psychiatry

## 2012-01-30 DIAGNOSIS — Z91040 Latex allergy status: Secondary | ICD-10-CM | POA: Insufficient documentation

## 2012-01-30 DIAGNOSIS — Z818 Family history of other mental and behavioral disorders: Secondary | ICD-10-CM | POA: Insufficient documentation

## 2012-01-30 DIAGNOSIS — Z833 Family history of diabetes mellitus: Secondary | ICD-10-CM | POA: Insufficient documentation

## 2012-01-30 DIAGNOSIS — F411 Generalized anxiety disorder: Secondary | ICD-10-CM | POA: Insufficient documentation

## 2012-01-30 DIAGNOSIS — I10 Essential (primary) hypertension: Secondary | ICD-10-CM | POA: Insufficient documentation

## 2012-01-30 DIAGNOSIS — Z825 Family history of asthma and other chronic lower respiratory diseases: Secondary | ICD-10-CM | POA: Insufficient documentation

## 2012-01-30 DIAGNOSIS — D62 Acute posthemorrhagic anemia: Secondary | ICD-10-CM | POA: Insufficient documentation

## 2012-01-30 DIAGNOSIS — F3130 Bipolar disorder, current episode depressed, mild or moderate severity, unspecified: Secondary | ICD-10-CM | POA: Insufficient documentation

## 2012-01-30 DIAGNOSIS — Z8489 Family history of other specified conditions: Secondary | ICD-10-CM | POA: Insufficient documentation

## 2012-01-30 DIAGNOSIS — F4310 Post-traumatic stress disorder, unspecified: Secondary | ICD-10-CM | POA: Insufficient documentation

## 2012-01-30 DIAGNOSIS — Z809 Family history of malignant neoplasm, unspecified: Secondary | ICD-10-CM | POA: Insufficient documentation

## 2012-01-30 DIAGNOSIS — Z8249 Family history of ischemic heart disease and other diseases of the circulatory system: Secondary | ICD-10-CM | POA: Insufficient documentation
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
## 2012-01-31 ENCOUNTER — Telehealth (HOSPITAL_COMMUNITY): Payer: Self-pay

## 2012-01-31 ENCOUNTER — Encounter (HOSPITAL_COMMUNITY): Payer: Self-pay | Admitting: Licensed Clinical Social Worker

## 2012-01-31 ENCOUNTER — Ambulatory Visit (INDEPENDENT_AMBULATORY_CARE_PROVIDER_SITE_OTHER): Payer: Managed Care, Other (non HMO) | Admitting: Licensed Clinical Social Worker

## 2012-01-31 DIAGNOSIS — F319 Bipolar disorder, unspecified: Secondary | ICD-10-CM

## 2012-01-31 DIAGNOSIS — F3130 Bipolar disorder, current episode depressed, mild or moderate severity, unspecified: Secondary | ICD-10-CM

## 2012-01-31 DIAGNOSIS — F4310 Post-traumatic stress disorder, unspecified: Secondary | ICD-10-CM
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
## 2012-02-01 ENCOUNTER — Other Ambulatory Visit (HOSPITAL_COMMUNITY): Payer: Managed Care, Other (non HMO) | Attending: Psychiatry

## 2012-02-01 ENCOUNTER — Encounter (HOSPITAL_COMMUNITY): Payer: Self-pay

## 2012-02-01 DIAGNOSIS — A630 Anogenital (venereal) warts: Secondary | ICD-10-CM | POA: Insufficient documentation

## 2012-02-01 DIAGNOSIS — F3130 Bipolar disorder, current episode depressed, mild or moderate severity, unspecified: Secondary | ICD-10-CM | POA: Insufficient documentation

## 2012-02-01 DIAGNOSIS — N870 Mild cervical dysplasia: Secondary | ICD-10-CM | POA: Insufficient documentation

## 2012-02-01 DIAGNOSIS — F4310 Post-traumatic stress disorder, unspecified: Secondary | ICD-10-CM | POA: Insufficient documentation

## 2012-02-01 DIAGNOSIS — F3132 Bipolar disorder, current episode depressed, moderate: Secondary | ICD-10-CM

## 2012-02-01 DIAGNOSIS — I10 Essential (primary) hypertension: Secondary | ICD-10-CM | POA: Insufficient documentation
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
## 2012-02-01 NOTE — Progress Notes (Signed)
    Daily Group Progress Note  Program: IOP  Group Time: 9:00-10:30 am   Participation Level: Active  Behavioral Response: Appropriate  Type of Therapy:  Process Group  Summary of Progress: Today was patients first day in the group. She was in and out due to completing the initial assessment and orientation. She was introduced to the group and appeared attentive as she observed the group process.      Group Time: 10:30 am - 12:00 pm   Participation Level:  Active  Behavioral Response: Appropriate  Type of Therapy: Psycho-education Group  Summary of Progress:  Patient learned the CBT skill of "Reframing" negative thoughts into neutral thoughts to reduce feelings of anxiety and depression.   Maxcine Ham, MSW, LCSW

## 2012-02-02 ENCOUNTER — Other Ambulatory Visit (HOSPITAL_COMMUNITY): Payer: Managed Care, Other (non HMO)

## 2012-02-02 ENCOUNTER — Encounter (HOSPITAL_COMMUNITY): Payer: Self-pay

## 2012-02-02 DIAGNOSIS — F3132 Bipolar disorder, current episode depressed, moderate: Secondary | ICD-10-CM

## 2012-02-02 NOTE — Progress Notes (Unsigned)
Psychiatric Assessment Adult  Patient Identification:  Monica [name] Date of Evaluation:  02/02/2012 Chief Complaint: depressed History of Chief Complaint:  No chief complaint on file. this patient is a 31 year old African American married woman who's here the IOP for the treatment of worsening depression. The patient is just recently separated from her husband who she's been married to for one and a half years. Patient is a 52-month-old child from him. Recently she was living with her parents who've now*to leave. For the last week she's been living with her brother. Her 34-month-old her is staying with her parents. The patient says that she feels persistent daily depression. She's been at the IOP program now 2 days and feels better in this setting. She claims she is sleeping well but eating very poorly. Her energy level is normal. She still able to think and concentrate and denies worthlessness. She denies suicidal ideation. Patient denies the use of alcohol. She has had significant use with marijuana and has a charge pending for her. Patient denies the use of other drugs. Patient denies psychotic symptomatology. She carries a diagnosis of bipolar disorder and describes. Her she will become very violent and angry. All of her mood states violence and anger and at times euphoria all came in response to things in her environment. In essence she denies any mood instability that occurs for reasons that she cannot explain. She denies anxiety symptoms consistent with generalized anxiety disorder panic disorder or OCD. The patient is a fairly extensive psychiatric history she's been in a psychiatric hospital 3 times the last one was the beginning of this year at [facility]. Today the patient does not appear angry or irritable. She is clearly in conflict with her parents and that they do not believe that she has a mental illness at all. The patient is wanting to improve herself and willing to engage  in stay in the IOP program. HPI Review of Systems Physical Exam  Depressive Symptoms: depressed mood,  (Hypo) Manic Symptoms:   Elevated Mood:  No Irritable Mood:  No Grandiosity:  No Distractibility:  No Labiality of Mood:  No Delusions:  No Hallucinations:  No Impulsivity:  No Sexually Inappropriate Behavior:  No Financial Extravagance:  No Flight of Ideas:  No  Anxiety Symptoms: Excessive Worry:  No Panic Symptoms:  No Agoraphobia:  No Obsessive Compulsive: No  Symptoms: None, Specific Phobias:  No Social Anxiety:  No  Psychotic Symptoms:  Hallucinations: No None Delusions:  No Paranoia:  No   Ideas of Reference:  No  PTSD Symptoms: Ever had a traumatic exposure:  No Had a traumatic exposure in the last month:  No Re-experiencing: No None Hypervigilance:  No Hyperarousal: No NoneAvoidance: No None  Traumatic Brain Injury: No   Past Psychiatric History: Diagnosis: Bipolar  Hospitalizations: 3 x  Outpatient Care: [facility]  Substance Abuse Care: no  Self-Mutilation:    Suicidal Attempts:   Violent Behaviors: yes in the past   Past Medical History:   Past Medical History  Diagnosis Date  . Anxiety   . History of pyelonephritis   . History of HPV infection   . Mild dysplasia of cervix (CIN I)   . H/O varicella   . Bacterial vaginosis   . Hypertension   . Anemia   . Hyperemesis arising during pregnancy 2010  . Genital warts 2002  . PTSD (post-traumatic stress disorder)   . Depression   . Bipolar affective disorder    History of Loss  of Consciousness:  No Seizure History:  No Cardiac History:  No Allergies:   Allergies  Allergen Reactions  . Latex Itching   Current Medications:  Current Outpatient Prescriptions  Medication Sig Dispense Refill  . acetaminophen (TYLENOL) 500 MG tablet Take 1,000 mg by mouth every 6 (six) hours as needed. For pain.      . diphenhydrAMINE (BENADRYL) 25 mg capsule Take 25 mg by mouth at bedtime as  needed. For itching      . ferrous sulfate 325 (65 FE) MG tablet Take 1 tablet (325 mg total) by mouth 3 (three) times daily with meals.  30 tablet  1  . gabapentin (NEURONTIN) 300 MG capsule Take 1 capsule (300 mg total) by mouth 3 (three) times daily.  90 capsule  1  . hydrochlorothiazide (HYDRODIURIL) 25 MG tablet Take 1 tablet (25 mg total) by mouth daily.  7 tablet  0  . lamoTRIgine (LAMICTAL) 150 MG tablet Take 1 tablet (150 mg total) by mouth daily.  30 tablet  1  . levonorgestrel-ethinyl estradiol (AVIANE,ALESSE,LESSINA) 0.1-20 MG-MCG tablet Take 1 tablet by mouth daily. Start on the first day of menses  1 Package  11  . neomycin-bacitracin-polymyxin (NEOSPORIN) ointment Apply 1 application topically 2 (two) times daily as needed. For cut on hand      . nystatin-triamcinolone ointment (MYCOLOG) Apply topically 2 (two) times daily.      . risperiDONE (RISPERDAL) 1 MG tablet Take 1 tablet (1 mg total) by mouth at bedtime.  30 tablet  1    Previous Psychotropic Medications:  Medication Dose   Risperdal, Lamictal, Neurontin                       Substance Abuse History in the last 12 months: Substance      Nicotine      Alcohol      Cannabis  This year    Opiates       Medical Consequences of Substance Abuse:   Legal Consequences of Substance Abuse:   Family Consequences of Substance Abuse:   Blackouts:   DT's:  No Withdrawal Symptoms:  No None  Social History: Current Place of Residence[place] Birth:  Family Members:  Marital Status:  Separated Children: 3  Sons:   Daughters:  Relationships:  Education:  HS Print production planner Problems/Performance:  Religious Beliefs/Practices:  History of Abuse:  Teacher, music History:  None. Legal History:  Hobbies/Interests:   Family History:   Family History  Problem Relation Age of Onset  . Hypertension Mother   . Diabetes Mother   . Depression Mother   . Depression Father     . Suicidality Cousin   . Depression Cousin   . Hypertension Cousin   . Depression Brother   . Hypertension Maternal Aunt   . Heart disease Paternal Aunt   . Cancer Paternal Aunt   . Heart disease Paternal Uncle   . Hypertension Maternal Grandfather   . Heart disease Maternal Grandfather   . Diabetes Paternal Grandmother   . Kidney disease Paternal Grandmother   . Asthma Paternal Grandmother   . Cancer Paternal Grandmother   . COPD Paternal Grandfather   . Heart disease Paternal Grandfather     Mental Status Examination/Evaluation: Objective:  Appearance: Casual  Eye Contact::  Good  Speech:  Clear and Coherent  Volume:  Normal  Mood:  sad  Affect:  Blunt  Thought Process:  Coherent  Orientation:  Full  Thought Content:  WDL  Suicidal Thoughts:  No  Homicidal Thoughts:  No  Judgement:  Good  Insight:  Fair  Psychomotor Activity:  Normal  Akathisia:  No  Handed:  Right  AIMS (if indicated):    Assets:  Communication Skills    Laboratory/X-Ray Psychological Evaluation(s)        Assessment:  Axis I: Bipolar, Depressed  AXIS I Bipolar, Depressed  AXIS II Deferred  AXIS III Past Medical History  Diagnosis Date  . Anxiety   . History of pyelonephritis   . History of HPV infection   . Mild dysplasia of cervix (CIN I)   . H/O varicella   . Bacterial vaginosis   . Hypertension   . Anemia   . Hyperemesis arising during pregnancy 2010  . Genital warts 2002  . PTSD (post-traumatic stress disorder)   . Depression   . Bipolar affective disorder      AXIS IV housing problems and occupational problems  AXIS V 51-60 moderate symptoms   Treatment Plan/Recommendations:  Plan of Care: continue in IOP and continue the same medication she's taking at this time. She now is that they have in fact been helpful.  Laboratory:    Psychotherapy: continue with Leanne Lovely  Medications: continue with Risperdal Lamictal and Neurontin.  Routine PRN Medications:  No   Consultations:   Safety Concerns:    Other:      [facility] 10/25/201310:22 AM

## 2012-02-02 NOTE — Progress Notes (Unsigned)
    Daily Group Progress Note  Program: IOP  Group Time: 9:00-10:30 am   Participation Level: Active  Behavioral Response: Appropriate  Type of Therapy:  Process Group  Summary of Progress: Patient arrived twenty minutes late for group without notifying staff or calling to say she would be running late. She missed her prescheduled 8:30 am meeting with the psychiatrist. She did not share much in group but appeared attentive.      Group Time: 10:30 am - 12:00 pm   Participation Level:  Active  Behavioral Response: Appropriate  Type of Therapy: Psycho-education Group  Summary of Progress: Patient participated in a goodbye ceremony for two members ending the program today and said how they impacted her and wished them well while practicing healthy closure.   Maxcine Ham, MSW, LCSW

## 2012-02-05 ENCOUNTER — Other Ambulatory Visit (HOSPITAL_COMMUNITY): Payer: Managed Care, Other (non HMO)

## 2012-02-06 ENCOUNTER — Other Ambulatory Visit (HOSPITAL_COMMUNITY): Payer: Managed Care, Other (non HMO)

## 2012-02-06 NOTE — Progress Notes (Unsigned)
    Daily Group Progress Note  Program: IOP  Group Time: 9:05-10:20 am  Participation Level: None  Behavioral Response: Rigid, Passive-Aggressive, Resistant and Agitated  Type of Therapy:  Process Group  Summary of Progress: Patient arrived five minutes late and stormed into the room and said in an aggitated tone "this is the best I could do", referring to being asked to arrive on time yesterday. She then sat angrily tapping her foot and avoiding eye contact with others. She grabbed her purse and walked out of the group room ten minutes before break and slammed the group room door.      Group Time: 10:30 am - 12:00 pm   Participation Level:  Minimal  Behavioral Response: Rigid, Resistant, Blaming and Agitated  Type of Therapy: Psycho-education Group  Summary of Progress: Writer asked patient in the group why she appeared agitated, as member was seen talking about Clinical research associate, the case manager and the doctor in the hallway to another group member just before group and was overheard saying she was upset that they were "making her come to group on time". She expressed her anger at the staff for not allowing her to come to the group when it was convenient for her given the stress she has in her life. Members expressed empathy for patient and said they were ok if she came by 9:15 each morning, but asked her to do heartmath before entering the room to avoid brining her anxiety into the calm group room. Member appeared annoyed with the process of having a discussion as a group and continued to bring up her anger at Clinical research associate. She stopped talking for the rest of the group after the group came to a consensus she could arrive by 9:15 am. She then asked when her disability paperwork could be completed and expressed urgency with having this done.  Maxcine Ham, MSW, LCSW

## 2012-02-07 ENCOUNTER — Other Ambulatory Visit (HOSPITAL_COMMUNITY): Payer: Managed Care, Other (non HMO)

## 2012-02-07 NOTE — Progress Notes (Unsigned)
    Daily Group Progress Note  Program: IOP  Group Time: 9:00-10:30 am   Participation Level: Active  Behavioral Response: Appropriate  Type of Therapy:  Process Group  Summary of Progress: Patient states her main stressor is getting written off from work due to job stress. She states "I will never feel ready to return" due to hating her job and feels she needs more time off to decide what she needs to do regarding her employment. She admitted that she was rude to the front desk staff yesterday when trying to meet with her doctor, Jorje Guild, to get her leave paperwork approved. She is being given feedback from others on how she comes across as rude and demanding. She struggles when limits are set for her and she is working on the skill of patience and responding well when limits are set for her.      Group Time: 10:30 am - 12:00 pm   Participation Level:  Active  Behavioral Response: Appropriate  Type of Therapy: Psycho-education Group  Summary of Progress: Patient learned the barriers to making progress in a group setting and then discussed personal barriers that would impact them making the most progress in group.  Maxcine Ham, MSW, LCSW

## 2012-02-08 ENCOUNTER — Other Ambulatory Visit (HOSPITAL_COMMUNITY): Payer: Managed Care, Other (non HMO)

## 2012-02-08 NOTE — Progress Notes (Unsigned)
    Daily Group Progress Note  Program: IOP  Group Time: 9:00-10:30 am   Participation Level: Active  Behavioral Response: Appropriate  Type of Therapy:  Process Group  Summary of Progress: Patient shared today and connected with the group for the first time on an emotional level. She talked about having constant feelings of anger and how she is realizing that she expresses it to others inappropriately. She stated she apologized to the secretary today for how she treated her the other day and stated it felt good to do that. She said the main thing she is learning is that "anger is just a feeling" and she wants to learn healthy ways to express it so it does not negatively affect others. She also expressed regret over her marriage ending and feeling responsible on some level for not giving things to her husband that he needed from her. She is also expressed feelings of resentment she has towards her daughter because she reminds her of her husband.      Group Time: 10:30 am - 12:00 pm   Participation Level:  Active  Behavioral Response: Appropriate  Type of Therapy: Psycho-education Group  Summary of Progress:  Patient participated in a goodbye ceremony, discussed the progress the member has made and said goodbye.  Maxcine Ham, MSW, LCSW

## 2012-02-09 ENCOUNTER — Other Ambulatory Visit (HOSPITAL_COMMUNITY): Payer: Managed Care, Other (non HMO) | Attending: Psychiatry

## 2012-02-09 DIAGNOSIS — I10 Essential (primary) hypertension: Secondary | ICD-10-CM | POA: Insufficient documentation

## 2012-02-09 DIAGNOSIS — A630 Anogenital (venereal) warts: Secondary | ICD-10-CM | POA: Insufficient documentation

## 2012-02-09 DIAGNOSIS — F4310 Post-traumatic stress disorder, unspecified: Secondary | ICD-10-CM | POA: Insufficient documentation

## 2012-02-09 DIAGNOSIS — N870 Mild cervical dysplasia: Secondary | ICD-10-CM | POA: Insufficient documentation

## 2012-02-09 DIAGNOSIS — F3130 Bipolar disorder, current episode depressed, mild or moderate severity, unspecified: Secondary | ICD-10-CM | POA: Insufficient documentation

## 2012-02-09 NOTE — Progress Notes (Unsigned)
    Daily Group Progress Note  Program: IOP  Group Time: 9:00-10:30 am   Participation Level: Active  Behavioral Response: Appropriate  Type of Therapy:  Process Group  Summary of Progress: Patient shared her anxiety associated with hating her job and her desire to return to school and get a degree in physical therapy. She said she is exploring this as an option. She also is working on understanding feelings of resentment towards her youngest daughter and how it is related to the anger she has towards the childs father.      Group Time: 10:30 am - 12:00 pm   Participation Level:  Active  Behavioral Response: Appropriate  Type of Therapy: Psycho-education Group  Summary of Progress: Patient participated in a skills group on feelings and causes. They discussed how to manage difficult feelings and normalized difficult feelings.  Maxcine Ham, MSW, LCSW

## 2012-02-12 ENCOUNTER — Other Ambulatory Visit (HOSPITAL_COMMUNITY): Payer: Managed Care, Other (non HMO)

## 2012-02-13 ENCOUNTER — Other Ambulatory Visit (HOSPITAL_COMMUNITY): Payer: Managed Care, Other (non HMO) | Admitting: Psychiatry

## 2012-02-13 DIAGNOSIS — F329 Major depressive disorder, single episode, unspecified: Secondary | ICD-10-CM

## 2012-02-13 NOTE — Progress Notes (Unsigned)
    Daily Group Progress Note  Program: IOP  Group Time: 9:00-10:30 am   Participation Level: None  Behavioral Response: Rigid, Passive-Aggressive, Resistant and Disruptive  Type of Therapy:  Psycho-education Group  Summary of Progress: Patient arrived twenty minutes late to the group and distracted the group from the current discussion. She appeared disengaged. Twenty minutes before the group ended she grabbed her purse and started to leave the group without saying anything. Writer asked where she was going and patient responded "I don't want to be here anymore". She said that is why she did not come yesterday and did not call to notify staff. Writer set a limit with patient and instructed her that if she left the group today that would not be allowed to re-enter the room today. Patient appeared agitated when this limit was set for her and sat with an angry look for the remainder of the group.      Group Time: 10:30 am - 12:00 pm   Participation Level:  Minimal  Behavioral Response: Appropriate  Type of Therapy: Psycho-education Group  Summary of Progress:Patient participated in an activity that promoted fun, laughter, distraction as well as allowing the group to get to know each other better. Patient admitted that it was enjoyable to laugh and be distracted from depression symptoms for a period of time. This promoted the ability to feel enjoyment during a depressive episode and teach the skills of distraction to manage symptoms.   Carman Ching, LCSW

## 2012-02-14 ENCOUNTER — Other Ambulatory Visit (HOSPITAL_COMMUNITY): Payer: Managed Care, Other (non HMO)

## 2012-02-14 MED ORDER — BUPROPION HCL 100 MG PO TABS: 100.0000 mg | ORAL_TABLET | Freq: Two times a day (BID) | ORAL | Status: DC

## 2012-02-14 NOTE — Progress Notes (Signed)
Patient ID: Monica [name], female   DOB: 07-11-1980, 31 y.o.   MRN: [id number] D:  Patient will continue in [facility] with a discharge date of 02-16-12.  Will follow up with Jorje Guild, PA on 02-21-12 and Geanie Berlin, LCSW on 02-29-12.  Recommendation is for patient to return to work, without any restrictions on 02-22-12.

## 2012-02-14 NOTE — Progress Notes (Incomplete)
[facility] MD Progress Note  02/14/2012 2:54 PM Monica [name]  MRN:  [id number]  Diagnosis:  {Axis Diagnosis:3049000}  ADL's:  {BHH DGU'Y:40347}  Sleep: {BHH QQVZ/DGLO/VFIE:33295}  Appetite:  {BHH GOOD/FAIR/POOR:22877}  Suicidal Ideation:  {BHH IDEATION:22878} Homicidal Ideation:  {BHH IDEATION:22878}  AEB (as evidenced by):  Mental Status Examination/Evaluation: Objective:  Appearance: {Appearance:22683}  Eye Contact::  {BHH EYE CONTACT:22684}  Speech:  {Speech:22685}  Volume:  {Volume (PAA):22686}  Mood:  {BHH MOOD:22306}  Affect:  {Affect (PAA):22687}  Thought Process:  {Thought Process (PAA):22688}  Orientation:  {BHH ORIENTATION (PAA):22689}  Thought Content:  {Thought Content:22690}  Suicidal Thoughts:  {ST/HT (PAA):22692}  Homicidal Thoughts:  {ST/HT (PAA):22692}  Memory:  {BHH MEMORY:22881}  Judgement:  {Judgement (PAA):22694}  Insight:  {Insight (PAA):22695}  Psychomotor Activity:  {Psychomotor (PAA):22696}  Concentration:  {BHH GOOD/FAIR/POOR:22877}  Recall:  {BHH GOOD/FAIR/POOR:22877}  Akathisia:  {BHH YES OR NO:22294}  Handed:  {Handed:22697}  AIMS (if indicated):     Assets:  {Assets (PAA):22698}  Sleep:      Vital Signs:There were no vitals taken for this visit. Current Medications: Current Outpatient Prescriptions  Medication Sig Dispense Refill  . acetaminophen (TYLENOL) 500 MG tablet Take 1,000 mg by mouth every 6 (six) hours as needed. For pain.      Marland Kitchen buPROPion (WELLBUTRIN) 100 MG tablet Take 1 tablet (100 mg total) by mouth 2 (two) times daily with breakfast and lunch.  60 tablet  0  . diphenhydrAMINE (BENADRYL) 25 mg capsule Take 25 mg by mouth at bedtime as needed. For itching      . ferrous sulfate 325 (65 FE) MG tablet Take 1 tablet (325 mg total) by mouth 3 (three) times daily with meals.  30 tablet  1  . gabapentin (NEURONTIN) 300 MG capsule Take 1 capsule (300 mg total) by mouth 3 (three) times daily.  90 capsule  1  . hydrochlorothiazide  (HYDRODIURIL) 25 MG tablet Take 1 tablet (25 mg total) by mouth daily.  7 tablet  0  . lamoTRIgine (LAMICTAL) 150 MG tablet Take 1 tablet (150 mg total) by mouth daily.  30 tablet  1  . levonorgestrel-ethinyl estradiol (AVIANE,ALESSE,LESSINA) 0.1-20 MG-MCG tablet Take 1 tablet by mouth daily. Start on the first day of menses  1 Package  11  . neomycin-bacitracin-polymyxin (NEOSPORIN) ointment Apply 1 application topically 2 (two) times daily as needed. For cut on hand      . nystatin-triamcinolone ointment (MYCOLOG) Apply topically 2 (two) times daily.      . risperiDONE (RISPERDAL) 1 MG tablet Take 1 tablet (1 mg total) by mouth at bedtime.  30 tablet  1    Lab Results: No results found for this or any previous visit (from the past 48 hour(s)).  Physical Findings: AIMS:  , ,  ,  ,    CIWA:    COWS:     Treatment Plan Summary: {BHH MD TX. JOAC:16606}  Plan:  Monica [name] 02/14/2012, 2:54 PM 1 the the date was oh who
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
## 2012-02-15 ENCOUNTER — Other Ambulatory Visit (HOSPITAL_COMMUNITY): Payer: Managed Care, Other (non HMO) | Admitting: Psychiatry

## 2012-02-15 NOTE — Progress Notes (Unsigned)
    Daily Group Progress Note  Program: IOP  Group Time: 9:00-10:30 am   Participation Level: Minimal  Behavioral Response: Appropriate  Type of Therapy:  Process Group  Summary of Progress: Patient expressed annoyance with her experience in the program. She talked about how she has not had the opportunity to see the doctor as much as she would like to have seen her. Members challenged patient on this and asked her why patient did not writer this need on her daily self inventory and patient said she has never filled one out and the members asked her if that was because she arrives late to group every day. Patient appeared upset by this and deflected the conversation. She is struggling with anger when she feels she is not getting her needs met and lacks healthy ways of communicating this anger to others.      Group Time: 10:30 am - 12:00 pm   Participation Level:  Active  Behavioral Response: Appropriate  Type of Therapy: Psycho-education Group  Summary of Progress: Patient participated in a discussion on how to access mental health support groups during and after their time in the program to manage depression and anxiety symptoms and maintain continued wellness.  Carman Ching, LCSW

## 2012-02-16 ENCOUNTER — Other Ambulatory Visit (HOSPITAL_COMMUNITY): Payer: Managed Care, Other (non HMO)

## 2012-02-16 MED ORDER — GABAPENTIN 300 MG PO CAPS: 300.0000 mg | ORAL_CAPSULE | Freq: Three times a day (TID) | ORAL | Status: DC

## 2012-02-16 NOTE — Progress Notes (Signed)
[facility] Health Intensive Outpatient Program Discharge Summary  DYEMOND WORMAN [id number]  Discharge Note  Patient:  Monica [name] is an 31 y.o., female DOB:  [birth year]  Date of Admission:  02/01/12  Date of Discharge: 02/16/12  Reason for Admission. Depression and anxiety H ospital Course: Patient was admitted because of worsening depression and failure of outpatient therapy. Initially she was continued on her home meds but because of her depression and rage, agitation and reactivation off her PTSD she was put on Wellbutrin 100 mg a.m. and p.m. Patient tolerated the medications well she would become overstimulated in the group setting and had a tendency to dissociate in the groups. Patient also acknowledged that being in groups and talking in groups a reactivated her traumatic memories. Patient was also struggling in trying to keep up with 3 children. She felt that she needed more time off so she current stabilized further. Overall patient was coping better sleep and appetite were good mood was anxious and she had no suicidal or homicidal ideation.  Mental Status at Discharge: Alert, oriented x3, affect was appropriate, speech was normal mood was anxious. No suicidal or homicidal ideation was noted. No hallucinations adhesions were present. Recent memory was fair remote memory was good, judgment and insight were good, concentration was fair recall was good.   Lab Results: No results found for this or any previous visit (from the past 48 hour(s)).  Current outpatient prescriptions:acetaminophen (TYLENOL) 500 MG tablet, Take 1,000 mg by mouth every 6 (six) hours as needed. For pain., Disp: , Rfl: ;  buPROPion (WELLBUTRIN) 100 MG tablet, Take 1 tablet (100 mg total) by mouth 2 (two) times daily with breakfast and lunch., Disp: 60 tablet, Rfl: 0;  diphenhydrAMINE (BENADRYL) 25 mg capsule, Take 25 mg by mouth at bedtime as needed. For itching, Disp: , Rfl:  ferrous sulfate 325  (65 FE) MG tablet, Take 1 tablet (325 mg total) by mouth 3 (three) times daily with meals., Disp: 30 tablet, Rfl: 1;  gabapentin (NEURONTIN) 300 MG capsule, Take 1 capsule (300 mg total) by mouth 3 (three) times daily., Disp: 90 capsule, Rfl: 1;  hydrochlorothiazide (HYDRODIURIL) 25 MG tablet, Take 1 tablet (25 mg total) by mouth daily., Disp: 7 tablet, Rfl: 0 lamoTRIgine (LAMICTAL) 150 MG tablet, Take 1 tablet (150 mg total) by mouth daily., Disp: 30 tablet, Rfl: 1;  levonorgestrel-ethinyl estradiol (AVIANE,ALESSE,LESSINA) 0.1-20 MG-MCG tablet, Take 1 tablet by mouth daily. Start on the first day of menses, Disp: 1 Package, Rfl: 11;  neomycin-bacitracin-polymyxin (NEOSPORIN) ointment, Apply 1 application topically 2 (two) times daily as needed. For cut on hand, Disp: , Rfl:  nystatin-triamcinolone ointment (MYCOLOG), Apply topically 2 (two) times daily., Disp: , Rfl: ;  risperiDONE (RISPERDAL) 1 MG tablet, Take 1 tablet (1 mg total) by mouth at bedtime., Disp: 30 tablet, Rfl: 1;  [DISCONTINUED] gabapentin (NEURONTIN) 300 MG capsule, Take 1 capsule (300 mg total) by mouth 3 (three) times daily., Disp: 90 capsule, Rfl: 1  Axis Diagnosis:   Axis I: Bipolar, Depressed, Post Traumatic Stress Disorder and Substance Abuse Axis II: Cluster B Traits Axis III:  Past Medical History  Diagnosis Date  . Anxiety   . History of pyelonephritis   . History of HPV infection   . Mild dysplasia of cervix (CIN I)   . H/O varicella   . Bacterial vaginosis   . Hypertension   . Anemia   . Hyperemesis arising during pregnancy 2010  . Genital warts 2002  . PTSD (  post-traumatic stress disorder)   . Depression   . Bipolar affective disorder    Axis IV: economic problems, occupational problems, other psychosocial or environmental problems, problems related to social environment and problems with primary support group Axis V: 61-70 mild symptoms   Level of Care:  OP  Discharge destination:  Home  Is patient on  multiple antipsychotic therapies at discharge:  No    Has Patient had three or more failed trials of antipsychotic monotherapy by history:  No  Patient phone:  [phone] (home)  Patient address:   [address] [place] [address],   Follow-up recommendations:  Activity:  As tolerated Diet:  Regular Other:  Followup for her medications with and walked in therapy with Mariah Milling  Comments:    The patient received suicide prevention pamphlet:  Yes Belongings returned:    [name] 02/16/2012, 12:19 PM  [facility] 02/16/2012
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
## 2012-02-16 NOTE — Patient Instructions (Signed)
Patient completed MH-IOP today.  Will follow up with Jorje Guild, PA-C on 02-21-12 @ 2:30pm and Geanie Berlin, LCSW on 02-29-12 @ 4pm.  Encouraged support groups.  Recommended that patient return to work on 02-22-12 without any restrictions.
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
## 2012-02-19 ENCOUNTER — Other Ambulatory Visit (HOSPITAL_COMMUNITY): Payer: Managed Care, Other (non HMO)

## 2012-02-20 ENCOUNTER — Other Ambulatory Visit (HOSPITAL_COMMUNITY): Payer: Managed Care, Other (non HMO)

## 2012-02-21 ENCOUNTER — Ambulatory Visit (INDEPENDENT_AMBULATORY_CARE_PROVIDER_SITE_OTHER): Payer: Managed Care, Other (non HMO) | Admitting: Physician Assistant

## 2012-02-21 ENCOUNTER — Emergency Department (HOSPITAL_COMMUNITY)
Admission: EM | Admit: 2012-02-21 | Discharge: 2012-02-22 | Disposition: A | Discharge status: Other Acute Inpatient Hospital | Payer: Managed Care, Other (non HMO) | Attending: Emergency Medicine | Admitting: Emergency Medicine

## 2012-02-21 ENCOUNTER — Other Ambulatory Visit (HOSPITAL_COMMUNITY): Payer: Managed Care, Other (non HMO)

## 2012-02-21 ENCOUNTER — Other Ambulatory Visit (HOSPITAL_COMMUNITY): Payer: Self-pay | Admitting: Physician Assistant

## 2012-02-21 ENCOUNTER — Encounter (HOSPITAL_COMMUNITY): Payer: Self-pay | Admitting: *Deleted

## 2012-02-21 DIAGNOSIS — F4310 Post-traumatic stress disorder, unspecified: Secondary | ICD-10-CM | POA: Insufficient documentation

## 2012-02-21 DIAGNOSIS — F319 Bipolar disorder, unspecified: Secondary | ICD-10-CM

## 2012-02-21 DIAGNOSIS — I10 Essential (primary) hypertension: Secondary | ICD-10-CM | POA: Insufficient documentation

## 2012-02-21 DIAGNOSIS — F329 Major depressive disorder, single episode, unspecified: Secondary | ICD-10-CM | POA: Insufficient documentation

## 2012-02-21 DIAGNOSIS — Z79899 Other long term (current) drug therapy: Secondary | ICD-10-CM | POA: Insufficient documentation

## 2012-02-21 DIAGNOSIS — D649 Anemia, unspecified: Secondary | ICD-10-CM | POA: Insufficient documentation

## 2012-02-21 DIAGNOSIS — Z87448 Personal history of other diseases of urinary system: Secondary | ICD-10-CM | POA: Insufficient documentation

## 2012-02-21 DIAGNOSIS — F3289 Other specified depressive episodes: Secondary | ICD-10-CM | POA: Insufficient documentation

## 2012-02-21 DIAGNOSIS — Z8619 Personal history of other infectious and parasitic diseases: Secondary | ICD-10-CM | POA: Insufficient documentation

## 2012-02-21 DIAGNOSIS — Z8742 Personal history of other diseases of the female genital tract: Secondary | ICD-10-CM | POA: Insufficient documentation

## 2012-02-21 DIAGNOSIS — F341 Dysthymic disorder: Secondary | ICD-10-CM | POA: Insufficient documentation

## 2012-02-21 LAB — COMPREHENSIVE METABOLIC PANEL
ALT: 17 U/L (ref 0–35)
AST: 30 U/L (ref 0–37)
Albumin: 4.2 g/dL (ref 3.5–5.2)
Alkaline Phosphatase: 50 U/L (ref 39–117)
Calcium: 9.2 mg/dL (ref 8.4–10.5)
GFR calc Af Amer: >90 mL/min (ref >90)
Glucose, Bld: 84 mg/dL (ref 70–99)
Potassium: 3.8 mEq/L (ref 3.5–5.1)
Sodium: 138 mEq/L (ref 135–145)
Total Protein: 7.7 g/dL (ref 6.0–8.3)

## 2012-02-21 LAB — RAPID URINE DRUG SCREEN, HOSP PERFORMED
Amphetamines: NOT DETECTED
Barbiturates: NOT DETECTED
Benzodiazepines: NOT DETECTED
Cocaine: NOT DETECTED
Opiates: NOT DETECTED
Tetrahydrocannabinol: POSITIVE — AB

## 2012-02-21 LAB — CBC
Hemoglobin: 12.7 g/dL (ref 12.0–15.0)
MCH: 31 pg (ref 26.0–34.0)
MCHC: 33.2 g/dL (ref 30.0–36.0)
Platelets: 215 10*3/uL (ref 150–400)

## 2012-02-21 LAB — ACETAMINOPHEN LEVEL: Acetaminophen (Tylenol), Serum: <15 ug/mL (ref 10–30)

## 2012-02-21 MED ORDER — NICOTINE 21 MG/24HR TD PT24
MEDICATED_PATCH | TRANSDERMAL | Status: AC
  Administered 2012-02-21: 21 mg
  Filled 2012-02-21: qty 1

## 2012-02-21 MED ORDER — RISPERIDONE 2 MG PO TABS
2.0000 mg | ORAL_TABLET | Freq: Every day | ORAL | Status: DC
  Administered 2012-02-21: 2 mg via ORAL
  Filled 2012-02-21: qty 1

## 2012-02-21 MED ORDER — RISPERIDONE 2 MG PO TABS: 2.0000 mg | ORAL_TABLET | Freq: Every day | ORAL | Status: DC

## 2012-02-21 MED ORDER — GABAPENTIN 300 MG PO CAPS: 600.0000 mg | ORAL_CAPSULE | Freq: Three times a day (TID) | ORAL | Status: DC

## 2012-02-21 MED ORDER — BUPROPION HCL 100 MG PO TABS
100.0000 mg | ORAL_TABLET | Freq: Two times a day (BID) | ORAL | Status: DC
  Filled 2012-02-21: qty 1

## 2012-02-21 MED ORDER — NICOTINE 21 MG/24HR TD PT24: 21.0000 mg | MEDICATED_PATCH | Freq: Once | TRANSDERMAL | Status: DC

## 2012-02-21 MED ORDER — LAMOTRIGINE 200 MG PO TABS
200.0000 mg | ORAL_TABLET | Freq: Every day | ORAL | Status: DC
  Administered 2012-02-21: 200 mg via ORAL
  Filled 2012-02-21: qty 1

## 2012-02-21 MED ORDER — LORAZEPAM 1 MG PO TABS: 1.0000 mg | ORAL_TABLET | Freq: Three times a day (TID) | ORAL | Status: DC | PRN

## 2012-02-21 MED ORDER — GABAPENTIN 300 MG PO CAPS
600.0000 mg | ORAL_CAPSULE | Freq: Three times a day (TID) | ORAL | Status: DC
  Administered 2012-02-21: 600 mg via ORAL
  Filled 2012-02-21: qty 2

## 2012-02-21 MED ORDER — LAMOTRIGINE 200 MG PO TABS: 200.0000 mg | ORAL_TABLET | Freq: Every day | ORAL | Status: DC
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
## 2012-02-21 NOTE — ED Provider Notes (Addendum)
History     CSN: [id number]  Arrival date & time 02/21/12  1701   First MD Initiated Contact with Patient 02/21/12 1725      Chief Complaint  Patient presents with  . Suicidal    (Consider location/radiation/quality/duration/timing/severity/associated sxs/prior treatment) The history is provided by the patient.  pt with hx anxiety, depression states worsening depression in past 7 months. Denies any acute event this week or recent unusual stressor. No specific exacerbating or alleviating factors. States takes meds as prescribed, but misses some doses. States doesn't feel meds help. Has been in intensive outpt program.  States otherwise physical health at baseline. No recent illness or other symptoms. No fever or chills. No nvd. No etoh abuse or opiate abuse, occ thc use. +thoughts of self harm, no definite plan or attempt. Denies overdose or any attempt at self harm. On hi.      Past Medical History  Diagnosis Date  . Anxiety   . History of pyelonephritis   . History of HPV infection   . Mild dysplasia of cervix (CIN I)   . H/O varicella   . Bacterial vaginosis   . Hypertension   . Anemia   . Hyperemesis arising during pregnancy 2010  . Genital warts 2002  . PTSD (post-traumatic stress disorder)   . Depression   . Bipolar affective disorder     Past Surgical History  Procedure Date  . Wisdom tooth extraction   . Cesarean section 2007 & 2010  . Cesarean section 07/14/2011    Procedure: CESAREAN SECTION;  Surgeon: Hal Morales, MD;  Location: [facility] ORS;  Service: Gynecology;  Laterality: N/A;  Repat C/S  [facility] 07/21/11  . Scar revision 07/14/2011    Procedure: SCAR REVISION;  Surgeon: Hal Morales, MD;  Location: [facility] ORS;  Service: Gynecology;;    Family History  Problem Relation Age of Onset  . Hypertension Mother   . Diabetes Mother   . Depression Mother   . Depression Father   . Suicidality Cousin   . Depression Cousin   . Hypertension Cousin   . Depression  Brother   . Hypertension Maternal Aunt   . Heart disease Paternal Aunt   . Cancer Paternal Aunt   . Heart disease Paternal Uncle   . Hypertension Maternal Grandfather   . Heart disease Maternal Grandfather   . Diabetes Paternal Grandmother   . Kidney disease Paternal Grandmother   . Asthma Paternal Grandmother   . Cancer Paternal Grandmother   . COPD Paternal Grandfather   . Heart disease Paternal Grandfather     History  Substance Use Topics  . Smoking status: Current Every Day Smoker -- 0.5 packs/day for 10 years    Types: Cigarettes  . Smokeless tobacco: Never Used  . Alcohol Use: 4.8 oz/week    8 Cans of beer per week    OB History    Grav Para Term Preterm Abortions TAB SAB Ect Mult Living   5 3 3  2  0 2 0 0 3      Review of Systems  Constitutional: Negative for fever and chills.  HENT: Negative for neck pain.   Eyes: Negative for redness.  Respiratory: Negative for shortness of breath.   Cardiovascular: Negative for chest pain.  Gastrointestinal: Negative for vomiting and abdominal pain.  Genitourinary: Negative for flank pain.  Musculoskeletal: Negative for back pain.  Skin: Negative for rash.  Neurological: Negative for headaches.  Hematological: Does not bruise/bleed easily.  Psychiatric/Behavioral: Positive for  dysphoric mood.    Allergies  Latex  Home Medications   Current Outpatient Rx  Name  Route  Sig  Dispense  Refill  . ACETAMINOPHEN 500 MG PO TABS   Oral   Take 1,000 mg by mouth every 6 (six) hours as needed. For pain.         Marland Kitchen BUPROPION HCL 100 MG PO TABS   Oral   Take 1 tablet (100 mg total) by mouth 2 (two) times daily with breakfast and lunch.   60 tablet   0   . GABAPENTIN 300 MG PO CAPS   Oral   Take 2 capsules (600 mg total) by mouth 3 (three) times daily.   180 capsule   1   . IBUPROFEN 200 MG PO TABS   Oral   Take 400 mg by mouth every 8 (eight) hours as needed. For pain.         Marland Kitchen LAMOTRIGINE 200 MG PO TABS    Oral   Take 200 mg by mouth at bedtime.         Marland Kitchen RISPERIDONE 2 MG PO TABS   Oral   Take 1 tablet (2 mg total) by mouth at bedtime.   30 tablet   1     BP 108/79  Pulse 85  Temp 98.6 F (37 C) (Oral)  Resp 16  SpO2 97%  LMP 01/23/2012  Physical Exam  Nursing note and vitals reviewed. Constitutional: She is oriented to person, place, and time. She appears well-developed and well-nourished. No distress.  HENT:  Nose: Nose normal.  Mouth/Throat: Oropharynx is clear and moist.  Eyes: Conjunctivae normal are normal. No scleral icterus.  Neck: Neck supple. No tracheal deviation present.  Cardiovascular: Normal rate, regular rhythm, normal heart sounds and intact distal pulses.  Exam reveals no gallop and no friction rub.   No murmur heard. Pulmonary/Chest: Effort normal and breath sounds normal. No respiratory distress.  Abdominal: Soft. Normal appearance. She exhibits no distension. There is no tenderness.  Musculoskeletal: She exhibits no edema and no tenderness.  Neurological: She is alert and oriented to person, place, and time.  Skin: Skin is warm and dry. No rash noted.  Psychiatric:       Flat affect, depressed mood.     ED Course  Procedures (including critical care time)   Labs Reviewed  ACETAMINOPHEN LEVEL  CBC  COMPREHENSIVE METABOLIC PANEL  ETHANOL  SALICYLATE LEVEL  URINE RAPID DRUG SCREEN (HOSP PERFORMED)   Results for orders placed during the hospital encounter of 02/21/12  ACETAMINOPHEN LEVEL      Component Value Range   Acetaminophen (Tylenol), Serum <15.0  10 - 30 ug/mL  CBC      Component Value Range   WBC 5.0  4.0 - 10.5 K/uL   RBC 4.10  3.87 - 5.11 MIL/uL   Hemoglobin 12.7  12.0 - 15.0 g/dL   HCT 21.3  08.6 - 57.8 %   MCV 93.4  78.0 - 100.0 fL   MCH 31.0  26.0 - 34.0 pg   MCHC 33.2  30.0 - 36.0 g/dL   RDW 46.9  62.9 - 52.8 %   Platelets 215  150 - 400 K/uL  COMPREHENSIVE METABOLIC PANEL      Component Value Range   Sodium 138  135 -  145 mEq/L   Potassium 3.8  3.5 - 5.1 mEq/L   Chloride 104  96 - 112 mEq/L   CO2 24  19 - 32 mEq/L  Glucose, Bld 84  70 - 99 mg/dL   BUN 8  6 - 23 mg/dL   Creatinine, Ser 4.09  0.50 - 1.10 mg/dL   Calcium 9.2  8.4 - 81.1 mg/dL   Total Protein 7.7  6.0 - 8.3 g/dL   Albumin 4.2  3.5 - 5.2 g/dL   AST 30  0 - 37 U/L   ALT 17  0 - 35 U/L   Alkaline Phosphatase 50  39 - 117 U/L   Total Bilirubin 0.3  0.3 - 1.2 mg/dL   GFR calc non Af Amer >90  >90 mL/min   GFR calc Af Amer >90  >90 mL/min  ETHANOL      Component Value Range   Alcohol, Ethyl (B) <11  0 - 11 mg/dL  SALICYLATE LEVEL      Component Value Range   Salicylate Lvl <2.0 (*) 2.8 - 20.0 mg/dL      MDM  Labs.   Reviewed nursing notes and prior charts for additional history.   Act team called. telepsych eval.    Act team indicates accepted to [facility], Dr Daleen Bo, bed ready.       Suzi Roots, MD 02/21/12 1801  Suzi Roots, MD 02/21/12 1844  Suzi Roots, MD 02/21/12 [phone]
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
## 2012-02-21 NOTE — ED Notes (Signed)
Belongings moved to locker #[id number] 

## 2012-02-21 NOTE — ED Notes (Signed)
Security officer Crawford cleared before transfer

## 2012-02-22 ENCOUNTER — Encounter (HOSPITAL_COMMUNITY): Payer: Self-pay | Admitting: Psychiatry

## 2012-02-22 ENCOUNTER — Inpatient Hospital Stay (HOSPITAL_COMMUNITY)
Admission: EM | Admit: 2012-02-22 | Discharge: 2012-02-27 | DRG: 885 | Disposition: A | Discharge status: Home / Self Care | Payer: Managed Care, Other (non HMO) | Source: Ambulatory Visit | Attending: Psychiatry | Admitting: Psychiatry

## 2012-02-22 ENCOUNTER — Ambulatory Visit (HOSPITAL_COMMUNITY): Payer: Self-pay | Admitting: Licensed Clinical Social Worker

## 2012-02-22 ENCOUNTER — Other Ambulatory Visit (HOSPITAL_COMMUNITY): Payer: Managed Care, Other (non HMO)

## 2012-02-22 DIAGNOSIS — F4310 Post-traumatic stress disorder, unspecified: Secondary | ICD-10-CM | POA: Diagnosis present

## 2012-02-22 DIAGNOSIS — Z8659 Personal history of other mental and behavioral disorders: Secondary | ICD-10-CM

## 2012-02-22 DIAGNOSIS — Z79899 Other long term (current) drug therapy: Secondary | ICD-10-CM

## 2012-02-22 DIAGNOSIS — Z915 Personal history of self-harm: Secondary | ICD-10-CM

## 2012-02-22 DIAGNOSIS — Z62819 Personal history of unspecified abuse in childhood: Secondary | ICD-10-CM

## 2012-02-22 DIAGNOSIS — O99345 Other mental disorders complicating the puerperium: Secondary | ICD-10-CM | POA: Diagnosis present

## 2012-02-22 DIAGNOSIS — F3289 Other specified depressive episodes: Secondary | ICD-10-CM | POA: Diagnosis present

## 2012-02-22 DIAGNOSIS — F3130 Bipolar disorder, current episode depressed, mild or moderate severity, unspecified: Principal | ICD-10-CM | POA: Diagnosis present

## 2012-02-22 DIAGNOSIS — Z87891 Personal history of nicotine dependence: Secondary | ICD-10-CM

## 2012-02-22 DIAGNOSIS — F53 Postpartum depression: Secondary | ICD-10-CM | POA: Diagnosis present

## 2012-02-22 DIAGNOSIS — F329 Major depressive disorder, single episode, unspecified: Secondary | ICD-10-CM | POA: Diagnosis present

## 2012-02-22 DIAGNOSIS — I10 Essential (primary) hypertension: Secondary | ICD-10-CM | POA: Diagnosis present

## 2012-02-22 MED ORDER — ACETAMINOPHEN 325 MG PO TABS
650.0000 mg | ORAL_TABLET | Freq: Four times a day (QID) | ORAL | Status: DC | PRN
Start: 2012-02-22 — End: 2012-02-27
  Administered 2012-02-23 – 2012-02-26 (×2): 650 mg via ORAL

## 2012-02-22 MED ORDER — NICOTINE 21 MG/24HR TD PT24
21.0000 mg | MEDICATED_PATCH | Freq: Every day | TRANSDERMAL | Status: DC
  Administered 2012-02-22 – 2012-02-27 (×6): 21 mg via TRANSDERMAL
  Filled 2012-02-22 (×9): qty 1

## 2012-02-22 MED ORDER — ESCITALOPRAM OXALATE 10 MG PO TABS
10.0000 mg | ORAL_TABLET | Freq: Every day | ORAL | Status: DC
  Administered 2012-02-22 – 2012-02-27 (×6): 10 mg via ORAL
  Filled 2012-02-22 (×7): qty 1

## 2012-02-22 MED ORDER — BUPROPION HCL 100 MG PO TABS
100.0000 mg | ORAL_TABLET | Freq: Two times a day (BID) | ORAL | Status: DC
  Administered 2012-02-22 – 2012-02-27 (×10): 100 mg via ORAL
  Filled 2012-02-22 (×6): qty 1
  Filled 2012-02-22: qty 8
  Filled 2012-02-22: qty 1
  Filled 2012-02-22: qty 8
  Filled 2012-02-22 (×5): qty 1

## 2012-02-22 MED ORDER — TRAZODONE HCL 50 MG PO TABS
50.0000 mg | ORAL_TABLET | Freq: Every evening | ORAL | Status: DC | PRN
  Administered 2012-02-22 – 2012-02-23 (×2): 50 mg via ORAL
  Filled 2012-02-22 (×5): qty 1

## 2012-02-22 MED ORDER — LAMOTRIGINE 100 MG PO TABS
200.0000 mg | ORAL_TABLET | Freq: Every day | ORAL | Status: DC
  Administered 2012-02-22 – 2012-02-26 (×5): 200 mg via ORAL
  Filled 2012-02-22 (×3): qty 1
  Filled 2012-02-22: qty 8
  Filled 2012-02-22 (×3): qty 1

## 2012-02-22 MED ORDER — ALUM & MAG HYDROXIDE-SIMETH 200-200-20 MG/5ML PO SUSP: 30.0000 mL | ORAL | Status: DC | PRN

## 2012-02-22 MED ORDER — GABAPENTIN 300 MG PO CAPS
300.0000 mg | ORAL_CAPSULE | Freq: Three times a day (TID) | ORAL | Status: DC | PRN
  Administered 2012-02-22 – 2012-02-24 (×6): 300 mg via ORAL
  Filled 2012-02-22 (×6): qty 1

## 2012-02-22 MED ORDER — ENSURE COMPLETE PO LIQD: 237.0000 mL | ORAL | Status: DC | PRN

## 2012-02-22 MED ORDER — ARIPIPRAZOLE 2 MG PO TABS
2.0000 mg | ORAL_TABLET | Freq: Every day | ORAL | Status: DC
  Administered 2012-02-22 – 2012-02-27 (×6): 2 mg via ORAL
  Filled 2012-02-22 (×8): qty 1

## 2012-02-22 MED ORDER — MAGNESIUM HYDROXIDE 400 MG/5ML PO SUSP
30.0000 mL | Freq: Every day | ORAL | Status: DC | PRN
  Administered 2012-02-26: 30 mL via ORAL

## 2012-02-22 MED ORDER — IBUPROFEN 200 MG PO TABS
400.0000 mg | ORAL_TABLET | Freq: Three times a day (TID) | ORAL | Status: DC | PRN
  Administered 2012-02-24: 400 mg via ORAL
  Filled 2012-02-22: qty 2

## 2012-02-22 NOTE — Progress Notes (Signed)
Patient ID: Monica [name], female   DOB: Mar 26, 1981, 31 y.o.   MRN: [id number]  She has been up and to groups interacting with peers and staff . Self inventory: depression 10, hopeless  7,  SI Positive this AM but contracted for safety. She continues to be flat and depressed.
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
## 2012-02-22 NOTE — Progress Notes (Signed)
Nutrition Brief Note  Patient identified on the Malnutrition Screening Tool (MST) Report  Body mass index is 26.47 kg/(m^2). Pt meets criteria for overweight based on current BMI.   Current diet order is Regular. Labs and medications reviewed.   RD notes pt reported wt loss on admission.  Pt is recently post-partum. Per chart review, pt is not breastfeeding.  Appetite has been poor, however pt with recent improvement to at least 1 meal/day.  RD to place order for Ensure Complete prn.  If nutrition issues arise, please consult RD.   Loyce Dys, MS RD LDN Clinical Inpatient Dietitian Pager: [phone] Weekend/After hours pager: [phone]
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
## 2012-02-22 NOTE — Tx Team (Signed)
Initial Interdisciplinary Treatment Plan  PATIENT STRENGTHS: (choose at least two) Ability for insight Average or above average intelligence Capable of independent living General fund of knowledge Physical Health Supportive family/friends  PATIENT STRESSORS: Marital or family conflict   PROBLEM LIST: Problem List/Patient Goals Date to be addressed Date deferred Reason deferred Estimated date of resolution  Suicidal Ideation 02/22/12     Depression 02/22/12                                                DISCHARGE CRITERIA:  Ability to meet basic life and health needs Improved stabilization in mood, thinking, and/or behavior Motivation to continue treatment in a less acute level of care Verbal commitment to aftercare and medication compliance  PRELIMINARY DISCHARGE PLAN: Attend aftercare/continuing care group Outpatient therapy Return to previous living arrangement  PATIENT/FAMIILY INVOLVEMENT: This treatment plan has been presented to and reviewed with the patient, Monica [name], and/or family member.  The patient and family have been given the opportunity to ask questions and make suggestions.  Leda Quail T 02/22/2012, 1:17 AM

## 2012-02-22 NOTE — Progress Notes (Signed)
Psychoeducational Group Note  Date:  02/22/2012 Time:  1100  Group Topic/Focus:  Rediscovering Joy:   The focus of this group is to explore various ways to relieve stress in a positive manner.  Participation Level:  Active  Participation Quality:  Appropriate, Attentive and Sharing  Affect:  Appropriate  Cognitive:  Appropriate  Insight:  Good  Engagement in Group:  Good  Additional Comments: Patient participated in group of rediscovering joy. Patient shared and stated ways humor and laughter affected the mind and body. Patient then stated others ways to have joy apart of life as a coping skill. Patient was asked to give one thing that brings joy to patient when it is related to five senses. Patient understood that using the five senses as tool was a way of being pro-active and coping skills. Karleen Hampshire Brittini 02/22/2012, 1:28 PM

## 2012-02-22 NOTE — Clinical Social Work Note (Signed)
[facility] Group Notes:  (Counselor/Nursing/MHT/Case Management/Adjunct)  Discharge Planning Group 8:30 - 9:30  Patient was attended discharge planning group but was pulled out to meet with MD before she was able to participate in group.        Living a Balanced Life  1:15-2:30 PM         02/22/2012 1:15  Type of Therapy: Group  Participation Level:  Active  Participation Quality:  Attentive  Affect:  Depressed but appropriate  Cognitive:  Alert and Appropriate  Insight:  Good  Engagement in Group:  Good  Engagement in Therapy:  Good  Modes of Intervention:  Education, Problem-solving, Support and Exploration  Summary of Progress/Problems:  Monica [name] attended groups.  She listened very attentively to speaker from the [employer].  She later shared that the speaker was telling her story.  She shared she is considering leaving her job in order to focus of her recovery.  She also shared she plans to return to [facility] for further treatment.   Monica [name], Hegel 02/22/2012 2:45 PM

## 2012-02-22 NOTE — Progress Notes (Signed)
Psychoeducational Group Note  Date:  02/22/2012 Time: 10:00 AM  Group Topic/Focus:  Therapeutic Activity-Question Ball  Participation Level:  Active  Participation Quality:  Appropriate  Affect:  Appropriate  Cognitive:  Alert and Oriented  Insight:  Good  Engagement in Group:  Good  Additional Comments:   Harold Barban E 02/22/2012, 2:01 PM

## 2012-02-23 MED ORDER — TEMAZEPAM 7.5 MG PO CAPS
7.5000 mg | ORAL_CAPSULE | Freq: Every evening | ORAL | Status: DC | PRN
  Administered 2012-02-23 – 2012-02-26 (×4): 7.5 mg via ORAL
  Filled 2012-02-23 (×4): qty 1

## 2012-02-23 MED ORDER — HYDROXYZINE HCL 50 MG PO TABS
50.0000 mg | ORAL_TABLET | Freq: Every day | ORAL | Status: DC
  Administered 2012-02-23 – 2012-02-26 (×4): 50 mg via ORAL
  Filled 2012-02-23 (×4): qty 1
  Filled 2012-02-23: qty 4
  Filled 2012-02-23: qty 1

## 2012-02-23 NOTE — Progress Notes (Signed)
Adult Psychosocial Assessment Update Interdisciplinary Team  [facility] admissions[facility]discharges:  Admissions Discharges  Date: 06/17/11 Date: 06/23/11  Date: Date:  Date: Date:  Date: Date:  Date: Date:   Changes since the last Psychosocial Assessment (including adherence to outpatient mental health and/or substance abuse treatment, situational issues contributing to decompensation and/or relapse). Patient reports admitting to hospital with SI.  She advised that she finds herself   Driving down the street thinking of running off a bridge or into an oncoming car.  She is  Living with parents who she describes as caring but not always supportive.  She is  Fearful of being terminated from job at [employer] of [place] as she is unable to stay focused  On her work  She endorses smoking THC on a frequent basis.     Discharge Plan 1. Will you be returning to the same living situation after discharge?   Yes: No:      If no, what is your plan?    Patient to return home with parents who are caring for her three children while she is   In the hospital.     2. Would you like a referral for services when you are discharged? Yes:     If yes, for what services?  No:         Patient is followed by [facility]. She is interested in staying with therapist  But does not want to continue working with P.A. As she does not believe he takes time  To hear her concerns. She advised he ignored her statement of having SI and allowed her to leave the hospital   Summary and Recommendations (to be completed by the evaluator) Monica [name] is a 31 year old AA female admitted with Bipolar disorder.  She will  Benefit from crisis stabilization evaluation for medication management, psychoeducation  Groups for coping skills development, group therapy and assistance with   Discharge planning.                 Signature:  Monica, [name], 02/23/2012 1:02 PM

## 2012-02-23 NOTE — Progress Notes (Signed)
Patient attended 11 am Psychoeducational group.  Group consists of therapeutic question ball along with long and short term goals 

## 2012-02-23 NOTE — Clinical Social Work Note (Signed)
[facility] Group Notes:  (Counselor/Nursing/MHT/Case Management/Adjunct)       Discharge Planning Group 8:30-9:30 AM  Patient seen during discharge planning group.  She advised of having SI last night but denied SI at time of group.  She states she can contract for safety and will advise staff if she feels unsafe.  She rates depression at six, anxiety at seven and hopelessness at four.  She shared she is seen in our outpatient clinic.  Patient is requesting another provider for medication management but wants to remain with current therapist.  She shared she does not believe current provider for medications takes time to listen to her.  She was advised of procedure in changing providers and that it can take six to eight weeks to get a new MD.       [facility] Group Notes:  (Counselor/Nursing/MHT/Case Management/Adjunct)       Feelings Around Relapse  02/23/2012  1:15  Type of Therapy: Group  Participation Level:  Active  Participation Quality:  Attentive  Affect:  Appropriate  Cognitive:  Alert and Appropriate  Insight:  Good  Engagement in Group:  Good  Engagement in Therapy:  Good  Modes of Intervention:  Education, Problem-solving, Support and Exploration  Summary of Progress/Problems:  Monica [name] stated that relapsing for her would mean returning to a deep depression.  She shared she would like to take time away from work and follow up with outpatient provider and [facility] to get better.  She also shared she needs to work of feelings toward infant daughter because she does not like her.   Monica [name], Monica [name] 02/23/2012 2:46 PM     Type of Therapy: Group  Participation Level:  Active  Participation Quality:  Attentive  Affect:  Appropriate  Cognitive:  Alert and Appropriate  Insight:  Good  Engagement in Group:  Good  Engagement in Therapy:  Good  Modes of Intervention:  Education, Dentist, Support and Exploration  Summary of  Progress/Problems:   Monica [name], Monica [name] 02/23/2012 2:45 PM

## 2012-02-23 NOTE — Progress Notes (Signed)
D: Patient appropriate and cooperative with staff and peers. Patient complained of anxiety. She reported on self inventory sheet that her energy level is low, but ability to pay attention is good. Patient rated depression and feelings of hopelessness "6".   A: Support and encouragement provided to patient. Administered scheduled medications per MD orders. PRN Gabapentin given for anxiety. Maintain 15 minute observation for safety.  R: Patient receptive. Patient has passive SI, but contracts for safety. Denies HI/AVH. Patient remains safe.

## 2012-02-23 NOTE — Progress Notes (Signed)
[facility] MD Progress Note  02/23/2012 3:10 PM Monica [name]  MRN:  [id number]  S: "I came in yesterday. 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 I had a baby 7 months ago.  My depression got so bad that I started thinking about committing suicide by either overdosing on pills and or jumping off a bridge. I did attempt suicide by overdose in in 2007 and 2012. My depression today is about #6. My anxiety at #8. I am on Trazodone for sleep. Trazodone gives me headaches. I need to be switched to something else. I still feel the suicide thoughts off and on. I will feel better if I can sleep a little better".  O: Negative for fever.  HENT: Negative for congestion and rhinorrhea.  Respiratory: Negative for cough, chest tightness and shortness of breath.  Cardiovascular: Negative for chest pain.  Gastrointestinal: Negative for nausea, vomiting and abdominal pain.  Skin: Negative for rash.  Neurological: Negative for weakness and headaches   Diagnosis:   Axis I: Bipolar affective depression, PTSD, post partum depression. Axis II: Deferred Axis III:  Past Medical History  Diagnosis Date  . Anxiety   . History of pyelonephritis   . History of HPV infection   . Mild dysplasia of cervix (CIN I)   . H/O varicella   . Bacterial vaginosis   . Hypertension   . Anemia   . Hyperemesis arising during pregnancy 2010  . Genital warts 2002  . PTSD (post-traumatic stress disorder)   . Depression   . Bipolar affective disorder    Axis IV: other psychosocial or environmental problems Axis V: 41-50 serious symptoms  ADL's:  Intact  Sleep: Fair  Appetite:  Good  Suicidal Ideation: "Yes, off and on" Plan:  Yes, thinking about jumping off off a bridge Intent:  No Means:  No Homicidal Ideation: "No" Plan:  No Intent:  No Means:  No  AEB (as evidenced by): Per patient's reports  Mental Status Examination/Evaluation: Objective:  Appearance: Casual  Eye Contact::  Good  Speech:  Clear and Coherent    Volume:  Normal  Mood:  Anxious, Depressed and Irritable  Affect:  Flat  Thought Process:  Coherent, Intact and Logical  Orientation:  Full  Thought Content:  Rumination  Suicidal Thoughts:  Yes.  with intent/plan, however, verbalized feeling safe here.  Homicidal Thoughts:  No  Memory:  Immediate;   Good Recent;   Good Remote;   Good  Judgement:  Fair  Insight:  Fair  Psychomotor Activity:  Normal  Concentration:  Fair  Recall:  Good  Akathisia:  No  Handed:  Right  AIMS (if indicated):     Assets:  Communication Skills Desire for Improvement  Sleep:  Number of Hours: 6.5    Vital Signs:Blood pressure 90/58, pulse 130, temperature 98.5 F (36.9 C), temperature source Oral, resp. rate 16, height 5\' 6"  (1.676 m), weight 74.39 kg (164 lb), last menstrual period 01/23/2012. Current Medications: Current Facility-Administered Medications  Medication Dose Route Frequency Provider Last Rate Last Dose  . acetaminophen (TYLENOL) tablet 650 mg  650 mg Oral Q6H PRN Kerry Hough, PA   650 mg at 02/23/12 8119  . alum & mag hydroxide-simeth (MAALOX/MYLANTA) 200-200-20 MG/5ML suspension 30 mL  30 mL Oral Q4H PRN Kerry Hough, PA      . ARIPiprazole (ABILIFY) tablet 2 mg  2 mg Oral Daily Rachael Fee, MD   2 mg at 02/23/12 1478  . buPROPion [facility]) tablet 100 mg  100  mg Oral BID WC Rachael Fee, MD   100 mg at 02/23/12 1205  . escitalopram (LEXAPRO) tablet 10 mg  10 mg Oral Daily Rachael Fee, MD   10 mg at 02/23/12 1610  . feeding supplement (ENSURE COMPLETE) liquid 237 mL  237 mL Oral PRN Ashley Jacobs, RD      . gabapentin (NEURONTIN) capsule 300 mg  300 mg Oral TID PRN Rachael Fee, MD   300 mg at 02/23/12 1457  . hydrOXYzine (ATARAX/VISTARIL) tablet 50 mg  50 mg Oral Q2000 Sanjuana Kava, NP      . ibuprofen (ADVIL,MOTRIN) tablet 400 mg  400 mg Oral Q8H PRN Rachael Fee, MD      . lamoTRIgine (LAMICTAL) tablet 200 mg  200 mg Oral QHS Rachael Fee, MD   200 mg at  02/22/12 2130  . magnesium hydroxide (MILK OF MAGNESIA) suspension 30 mL  30 mL Oral Daily PRN Kerry Hough, PA      . nicotine (NICODERM CQ - dosed in mg/24 hours) patch 21 mg  21 mg Transdermal Q0600 Kerry Hough, PA   21 mg at 02/23/12 0644  . [DISCONTINUED] traZODone (DESYREL) tablet 50 mg  50 mg Oral QHS,MR X 1 Kerry Hough, PA   50 mg at 02/23/12 0122    Lab Results:  Results for orders placed during the hospital encounter of 02/21/12 (from the past 48 hour(s))  ACETAMINOPHEN LEVEL     Status: Normal   Collection Time   02/21/12  5:45 PM      Component Value Range Comment   Acetaminophen (Tylenol), Serum <15.0  10 - 30 ug/mL   CBC     Status: Normal   Collection Time   02/21/12  5:45 PM      Component Value Range Comment   WBC 5.0  4.0 - 10.5 K/uL    RBC 4.10  3.87 - 5.11 MIL/uL    Hemoglobin 12.7  12.0 - 15.0 g/dL    HCT 96.0  45.4 - 09.8 %    MCV 93.4  78.0 - 100.0 fL    MCH 31.0  26.0 - 34.0 pg    MCHC 33.2  30.0 - 36.0 g/dL    RDW 11.9  14.7 - 82.9 %    Platelets 215  150 - 400 K/uL   COMPREHENSIVE METABOLIC PANEL     Status: Normal   Collection Time   02/21/12  5:45 PM      Component Value Range Comment   Sodium 138  135 - 145 mEq/L    Potassium 3.8  3.5 - 5.1 mEq/L    Chloride 104  96 - 112 mEq/L    CO2 24  19 - 32 mEq/L    Glucose, Bld 84  70 - 99 mg/dL    BUN 8  6 - 23 mg/dL    Creatinine, Ser 5.62  0.50 - 1.10 mg/dL    Calcium 9.2  8.4 - 13.0 mg/dL    Total Protein 7.7  6.0 - 8.3 g/dL    Albumin 4.2  3.5 - 5.2 g/dL    AST 30  0 - 37 U/L    ALT 17  0 - 35 U/L    Alkaline Phosphatase 50  39 - 117 U/L    Total Bilirubin 0.3  0.3 - 1.2 mg/dL    GFR calc non Af Amer >90  >90 mL/min    GFR calc Af Amer >90  >  90 mL/min   ETHANOL     Status: Normal   Collection Time   02/21/12  5:45 PM      Component Value Range Comment   Alcohol, Ethyl (B) <11  0 - 11 mg/dL   SALICYLATE LEVEL     Status: Abnormal   Collection Time   02/21/12  5:45 PM       Component Value Range Comment   Salicylate Lvl <2.0 (*) 2.8 - 20.0 mg/dL   URINE RAPID DRUG SCREEN (HOSP PERFORMED)     Status: Abnormal   Collection Time   02/21/12  9:13 PM      Component Value Range Comment   Opiates NONE DETECTED  NONE DETECTED    Cocaine NONE DETECTED  NONE DETECTED    Benzodiazepines NONE DETECTED  NONE DETECTED    Amphetamines NONE DETECTED  NONE DETECTED    Tetrahydrocannabinol POSITIVE (*) NONE DETECTED    Barbiturates NONE DETECTED  NONE DETECTED   PREGNANCY, URINE     Status: Normal   Collection Time   02/21/12  9:13 PM      Component Value Range Comment   Preg Test, Ur NEGATIVE  NEGATIVE     Physical Findings: AIMS: Facial and Oral Movements Muscles of Facial Expression: None, normal Lips and Perioral Area: None, normal Jaw: None, normal Tongue: None, normal,Extremity Movements Upper (arms, wrists, hands, fingers): None, normal Lower (legs, knees, ankles, toes): None, normal, Trunk Movements Neck, shoulders, hips: None, normal, Overall Severity Severity of abnormal movements (highest score from questions above): None, normal Incapacitation due to abnormal movements: None, normal Patient's awareness of abnormal movements (rate only patient's report): No Awareness, Dental Status Current problems with teeth and/or dentures?: No Does patient usually wear dentures?: No  CIWA:  CIWA-Ar Total: 0  COWS:  COWS Total Score: 0   Treatment Plan Summary: Daily contact with patient to assess and evaluate symptoms and progress in treatment Medication management Discontinued Trazodone, vistaril 50 mg added Q bedtime for sleep.  Plan: Discontinue Trazodone, patient complained it is giving her headaches. Start Vistaril 50 mg Q bedtime for sleep.  Continue current treatment plan.  [name] I 02/23/2012, 3:10 PM

## 2012-02-23 NOTE — Progress Notes (Signed)
D: Pt in bed resting with eyes closed. Respirations even and unlabored. Pt appears to be in no signs of distress at this time. A: Q15min checks remains for this pt. R: Pt remains safe at this time.   

## 2012-02-24 MED ORDER — NYSTATIN 100000 UNIT/GM EX CREA
TOPICAL_CREAM | Freq: Two times a day (BID) | CUTANEOUS | Status: DC
  Administered 2012-02-24 – 2012-02-27 (×3): via TOPICAL
  Filled 2012-02-24 (×2): qty 15

## 2012-02-24 MED ORDER — GABAPENTIN 400 MG PO CAPS
400.0000 mg | ORAL_CAPSULE | Freq: Three times a day (TID) | ORAL | Status: DC | PRN
  Administered 2012-02-24 – 2012-02-27 (×8): 400 mg via ORAL
  Filled 2012-02-24 (×2): qty 1
  Filled 2012-02-24: qty 12
  Filled 2012-02-24 (×6): qty 1

## 2012-02-24 NOTE — Progress Notes (Signed)
Patient ID: Monica [name], female   DOB: 1980-04-20, 31 y.o.   MRN: [id number]  D: Patient lying in bed with eyes closed. Respirations even and non-labored. A: Staff will monitor on q 15 minute checks and follow treatments and give medications as ordered. R: No response due to patient sleeping at this time,

## 2012-02-24 NOTE — Progress Notes (Signed)
[facility] Group Notes:  (Counselor/Nursing/MHT/Case Management/Adjunct)  02/24/2012 9:39 PM  Type of Therapy:  Psychoeducational Skills  Participation Level:  Active  Participation Quality:  Appropriate  Affect:  Appropriate  Cognitive:  Appropriate  Insight:  Good  Engagement in Group:  Good  Engagement in Therapy:  Good  Modes of Intervention:  Education  Summary of Progress/Problems: The patient mentioned in group that she had a bad morning since she had bad dreams last evening. The patient talked with her peers as a coping skill and later took some medication for her anxiety. Her goal for tomorrow is to start working on her discharge plans.     Monica [name] 02/24/2012, 9:39 PM

## 2012-02-24 NOTE — Progress Notes (Signed)
Patient came to nursing station requesting medication for anxiety. Patient received visteril and was given nystatin cream which was sent by pharmacy and was given late. Patient reported that her day started off bad but had gotten better d/t her suicidal thoughts. Patient verbally contracts for safety, was given scheduled meds and informed of medications due at 2200. Patient denies hi/a/v hall. Safety maintained on unit, will continue to monitor with 15 min checks.

## 2012-02-24 NOTE — Progress Notes (Signed)
Psychoeducational Group Note  Date:  02/24/2012 Time: 1515  Group Topic/Focus:  Healthy Communication:   The focus of this group is to discuss communication, barriers to communication, as well as healthy ways to communicate with others.  Participation Level:  Active  Participation Quality:  Appropriate, Sharing and Supportive  Affect:  Appropriate  Cognitive:  Appropriate  Insight:  Good  Engagement in Group:  Good  Additional Comments:  none  Trasean Delima M 02/24/2012, 3:01 PM

## 2012-02-24 NOTE — Clinical Social Work Note (Signed)
[facility] Group Notes:  (Clinical Social Work)  02/24/2012  3:00-4:00PM  Summary of Progress/Problems:   The main focus of today's process group was for the patient to identify ways in which they have in the past sabotaged their own recovery and reasons they may have done this/what they received from doing it.  We then worked to identify a specific plan to avoid doing this when discharged from the hospital for this admission.  The patient expressed that she sabotaged herself by smoking marijuana even while some criminal charges are being evaluated for dismissal.  She wants to change jobs, and says with excitement that the new place would not drug test her.  She stated she will smoke marijuana when she gets home.  She believes that the postpartum depression is too unpredictable so she cannot come up with plans for how to handle symptoms as they appear.  The group gave her good feedback about the prospects she will have in employment and criminal record if she continues to smoke marijuana.  Type of Therapy:  Group Therapy - Process  Participation Level:  Active  Participation Quality:  Appropriate, Attentive and Sharing  Affect:  Appropriate and Blunted  Cognitive:  Alert and Oriented  Insight:  Limited  Engagement in Group:  Good  Engagement in Therapy:  Good  Modes of Intervention:  Clarification, Education, Limit-setting, Problem-solving, Socialization, Support and Processing   Ambrose Mantle, LCSW 02/24/2012, 5:21 PM

## 2012-02-24 NOTE — Progress Notes (Signed)
Psychoeducational Group Note  Date: 02/24/2012 Time:  1015  Group Topic/Focus:  Identifying Needs:   The focus of this group is to help patients identify their personal needs that have been historically problematic and identify healthy behaviors to address their needs.  Participation Level:  Active  Participation Quality:  Appropriate  Affect:  Appropriate  Cognitive:  Appropriate  Insight:  Good  Engagement in Group:  Good  Additional Comments:    Woodard Perrell A  

## 2012-02-24 NOTE — Progress Notes (Signed)
Psychoeducational Group Note  Date:  02/24/2012 Time:  2000  Group Topic/Focus:  Wrap-Up Group:   The focus of this group is to help patients review their daily goal of treatment and discuss progress on daily workbooks.  Participation Level:  Active  Participation Quality:  Appropriate  Affect:  Appropriate  Cognitive:  Appropriate  Insight:  Limited  Engagement in Group:  Limited  Additional Comments:  Pt attended wrap-up group this evening. Pt didn't share much with group.   Bartow Zylstra A 02/24/2012, 3:23 AM
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
## 2012-02-24 NOTE — Progress Notes (Signed)
Patient ID: Monica [name], female   DOB: 17-Dec-1980, 31 y.o.   MRN: [id number] 02-24-12 nursing shift note: D: pt has been visible in the milieu today. She has gone to groups, taken her meds and interacted with her peers. She did complain of some anxiety. She stated she was having some si. A: administered a neurontin prn for the anxiety. investigated the si.  R: pts anxiety was decreased with medication .  She is able to contract for the si.  rn will monitor and q 15 min cks continue.

## 2012-02-24 NOTE — Progress Notes (Signed)
Goals Group Note  Date: 02/24/2012 Time: 0915  Group Topic/Focus:  This group focuses on helping patients identify their emotional level, it introduces them to the Saturday Patient Workbook and it helps motivate the patients to begin to work on their problems and identify goals they want to achieve. Participation Level:  passive Participation Quality: good Affect: flat Cognitive:  good  Insight:  good  Engagement in Group: engaged  Additional Comments:  PD RN BC 0930

## 2012-02-25 NOTE — Progress Notes (Signed)
Patient attended 1 pm psycho educational group. This group consisted of the therapeutic question ball, long and short term goals, support systems, and therapeutic coping skills that pt can use everyday.  

## 2012-02-25 NOTE — Progress Notes (Signed)
Monica [name] remains sad and depressed. She completes her self inventory today and on it she wrote her depression and hopelessness are "4/4" and that she cont to have " off and on" SI within the past 24 hrs, but she contracts for safety with this nurse. She attends her groups as scheduled and has requested and was given neurontin 400 mg PO for complaints of anxiety ( she stated relief both times). She states her DC plan is to " take meds".  A POC intact and pt is engaged in her recovery. ` R Safety maintaiend and therapeutic relationship is fostered.

## 2012-02-25 NOTE — Clinical Social Work Note (Signed)
[facility] Group Notes:  (Clinical Social Work)  02/25/2012   3:00-4:00PM  Summary of Progress/Problems:   The main focus of today's process group was for the patient to define "support" and describe what healthy supports are, then to identify the patient's current support system and decide on other supports that can be put in place to prevent future hospitalizations.  An emphasis was placed on using therapist, doctor and problem-specific support groups to expand supports. The patient expressed that today is a bad day for her, and her affect showed this clearly.  She said her parents are supportive of her with regards to financial assistance and taking care of the kids, but they do not believe in mental illness and she feels judged by them.  She is not even sure if she has a mental illness.  She listened carefully to the description about the [facility].  Type of Therapy:  Group Therapy  Participation Level:  Minimal  Participation Quality:  Attentive  Affect:  Depressed and Flat  Cognitive:  Oriented  Insight:  Good  Engagement in Group:  Limited  Engagement in Therapy:  Limited  Modes of Intervention:  Clarification, Education, Limit-setting, Problem-solving, Socialization, Support and Processing   Ambrose Mantle, LCSW 02/25/2012, 4:10 PM

## 2012-02-25 NOTE — Progress Notes (Signed)
Patient ID: Monica [name], female   DOB: 1980-09-17, 31 y.o.   MRN: [id number] [facility] MD Progress Note  02/25/2012 3:51 PM Monica [name]  MRN:  [id number]  S: "Vistaril is good for my anxiety.  I did not sleep well last night because of my night-mares. The night-mares is a portrait of how I would want my life to be, it is just after I woke up and realize that it was a dream, I became sad and suicidal"  O: Negative for fever.  HENT: Negative for congestion and rhinorrhea.  Respiratory: Negative for cough, chest tightness and shortness of breath.  Cardiovascular: Negative for chest pain.  Gastrointestinal: Negative for nausea, vomiting and or abdominal pain.  Skin: Negative for rash, bruises and or lacerations.  Neurological: Negative for weakness and headaches   Diagnosis:   Axis I: Post Traumatic Stress Disorder and Bipolar affective disorder, depressed, post partum depresion Axis II: Deferred Axis III:  Past Medical History  Diagnosis Date  . Anxiety   . History of pyelonephritis   . History of HPV infection   . Mild dysplasia of cervix (CIN I)   . H/O varicella   . Bacterial vaginosis   . Hypertension   . Anemia   . Hyperemesis arising during pregnancy 2010  . Genital warts 2002  . PTSD (post-traumatic stress disorder)   . Depression   . Bipolar affective disorder    Axis IV: other psychosocial or environmental problems Axis V: 51-60 moderate symptoms  ADL's:  Intact  Sleep: Good  Appetite:  Good  Suicidal Ideation: "no" Plan:  No Intent:  No Means:  No Homicidal Ideation: "No" Plan:  No Intent:  No Means:  No  AEB (as evidenced by): Per patient's reports.  Mental Status Examination/Evaluation: Objective:  Appearance: Casual  Eye Contact::  Good  Speech:  Clear and Coherent  Volume:  Normal  Mood:  Depressed  Affect:  Blunt and Flat  Thought Process:  Coherent  Orientation:  Full  Thought Content:  Rumination  Suicidal Thoughts:  No    Homicidal Thoughts:  No  Memory:  Immediate;   Good Recent;   Good Remote;   Good  Judgement:  Fair  Insight:  Good  Psychomotor Activity:  Normal  Concentration:  Good  Recall:  Good  Akathisia:  No  Handed:  Right  AIMS (if indicated):     Assets:  Desire for Improvement  Sleep:  Number of Hours: 5.75    Vital Signs:Blood pressure 110/77, pulse 112, temperature 98 F (36.7 C), temperature source Oral, resp. rate 16, height 5\' 6"  (1.676 m), weight 74.39 kg (164 lb), last menstrual period 01/23/2012. Current Medications: Current Facility-Administered Medications  Medication Dose Route Frequency Provider Last Rate Last Dose  . acetaminophen (TYLENOL) tablet 650 mg  650 mg Oral Q6H PRN Kerry Hough, PA   650 mg at 02/23/12 7829  . alum & mag hydroxide-simeth (MAALOX/MYLANTA) 200-200-20 MG/5ML suspension 30 mL  30 mL Oral Q4H PRN Kerry Hough, PA      . ARIPiprazole (ABILIFY) tablet 2 mg  2 mg Oral Daily Rachael Fee, MD   2 mg at 02/25/12 0826  . buPROPion [facility]) tablet 100 mg  100 mg Oral BID WC Rachael Fee, MD   100 mg at 02/25/12 1259  . escitalopram (LEXAPRO) tablet 10 mg  10 mg Oral Daily Rachael Fee, MD   10 mg at 02/25/12 0825  . feeding supplement (ENSURE COMPLETE) liquid  237 mL  237 mL Oral PRN Ashley Jacobs, RD      . gabapentin (NEURONTIN) capsule 400 mg  400 mg Oral TID PRN Sanjuana Kava, NP   400 mg at 02/25/12 1515  . hydrOXYzine (ATARAX/VISTARIL) tablet 50 mg  50 mg Oral Q2000 Sanjuana Kava, NP   50 mg at 02/24/12 1952  . ibuprofen (ADVIL,MOTRIN) tablet 400 mg  400 mg Oral Q8H PRN Rachael Fee, MD   400 mg at 02/24/12 [phone]  . lamoTRIgine (LAMICTAL) tablet 200 mg  200 mg Oral QHS Rachael Fee, MD   200 mg at 02/24/12 2107  . magnesium hydroxide (MILK OF MAGNESIA) suspension 30 mL  30 mL Oral Daily PRN Kerry Hough, PA      . nicotine (NICODERM CQ - dosed in mg/24 hours) patch 21 mg  21 mg Transdermal Q0600 Kerry Hough, PA   21 mg at 02/25/12  0615  . nystatin cream (MYCOSTATIN)   Topical BID Sanjuana Kava, NP      . temazepam (RESTORIL) capsule 7.5 mg  7.5 mg Oral QHS PRN Nanine Means, NP   7.5 mg at 02/24/12 2250    Lab Results: No results found for this or any previous visit (from the past 48 hour(s)).  Physical Findings: AIMS: Facial and Oral Movements Muscles of Facial Expression: None, normal Lips and Perioral Area: None, normal Jaw: None, normal Tongue: None, normal,Extremity Movements Upper (arms, wrists, hands, fingers): None, normal Lower (legs, knees, ankles, toes): None, normal, Trunk Movements Neck, shoulders, hips: None, normal, Overall Severity Severity of abnormal movements (highest score from questions above): None, normal Incapacitation due to abnormal movements: None, normal Patient's awareness of abnormal movements (rate only patient's report): No Awareness, Dental Status Current problems with teeth and/or dentures?: No Does patient usually wear dentures?: No  CIWA:  CIWA-Ar Total: 0  COWS:  COWS Total Score: 0   Treatment Plan Summary: Daily contact with patient to assess and evaluate symptoms and progress in treatment Medication management  Plan: No new changes made on current treatment regimen.. Continue current treatment plan.  [name] I 02/25/2012, 3:51 PM

## 2012-02-26 NOTE — Tx Team (Signed)
Interdisciplinary Treatment Plan Update (Adult)  Date:  02/26/2012  Time Reviewed:  9:39 AM   Progress in Treatment: Attending groups:   Yes   Participating in groups:  Yes Taking medication as prescribed:  Yes Tolerating medication:  Yes Family/Significant othe contact made: Contact made with family Patient understands diagnosis:  Yes Discussing patient identified problems/goals with staff: Yes Medical problems stabilized or resolved: Yes Denies suicidal/homicidal ideation:Yes Issues/concerns per patient self-inventory:  Other:  New problem(s) identified:  Reason for Continuation of Hospitalization: Anxiety Depression Medication stabilization  Interventions implemented related to continuation of hospitalization:  Medication Management; safety checks q 15 mins  Additional comments:  Estimated length of stay:  1-2 days  Discharge Plan:  Home with outpatient follow up  New goal(s):  Review of initial/current patient goals per problem list:    1.  Goal(s): Eliminate SI/other thoughts of self harm   Met:  Yes  Target date: d/c  As evidenced by: Patient no longer endorsing SI/HI or other thoughts of self harm.    2.  Goal (s):Reduce depression/anxiety  Met: Yes  Target date: d/c  As evidenced by: Patient currently rating symptoms at four or below    3.  Goal(s):.stabilize on meds   Met:  No  Target date: d/c  As evidenced by: Patient will report being stabilized on medications - less symptomatic    4.  Goal(s): Refer for outpatient follow up   Met:  Yes  Target date: d/c  As evidenced by: Follow up appointment scheduled    Attendees: Patient:   02/26/2012 9:39 AM  Physican:  Patrick North, MD 02/26/2012 9:39 AM  Nursing:  Neill Loft, RN 02/26/2012 9:39 AM   Nursing:   [name] Reichert, RN 02/26/2012 9:39 AM   Clinical Social Worker:  Juline Patch, LCSW 02/26/2012 9:39 AM   Other:  Serena Colonel, FNP 02/26/2012 9:39 AM    Other:        02/26/2012 02/26/2012 Other:        02/26/2012 9:39 AM

## 2012-02-26 NOTE — Clinical Social Work Note (Signed)
Discharge Planning Group 8:30-9:30 AM  Patient seen during discharge planning group.  She advised of being better today and denies SI/HI.  She rates depression at zero and anxieity at two.  Patient is hopeful to discharge tomorrow.   [facility] Group Notes:  (Counselor/Nursing/MHT/Case Management/Adjunct)     Overcoming Obstacles 1:15 -2:30 PM         02/26/2012   1:15   Type of Therapy: Group  Participation Level:  Active  Participation Quality:  Attentive  Affect:  Appropriate  Cognitive:  Alert and Appropriate  Insight:  Good  Engagement in Group:  Good  Engagement in Therapy:  Good  Modes of Intervention:  Education, Problem-solving, Support and Exploration  Summary of Progress/Problems:  Patient shared her obstacle is postpartum depression.  She stated she is willing to take medication and attend groups to overcome her problems.    Sky Borboa Hammes 02/26/2012 3:00 PM       02/26/2012   1:15   Type of Therapy: Group  Participation Level: Minimal  Participation Quality:  Attentive  Affect:  Appropriate  Cognitive:  Alert and Appropriate  Insight:  Minimal  Engagement in Group:  Minimal  Engagement in Therapy:  Minimal  Modes of Intervention:  Education, Dentist, Support and Exploration  Summary of Progress/Problems:   [place], [place] 02/26/2012 11:20 AM

## 2012-02-26 NOTE — Progress Notes (Signed)
Psychoeducational Group Note  Date:  02/26/2012 Time:  1100  Group Topic/Focus:  Self Care:   The focus of this group is to help patients understand the importance of self-care in order to improve or restore emotional, physical, spiritual, interpersonal, and financial health.  Participation Level:  Did Not Attend   Meredith Staggers 02/26/2012, 12:09 PM

## 2012-02-26 NOTE — Progress Notes (Signed)
[facility] Adult Inpatient Family/Significant Other Suicide Prevention Education  Suicide Prevention Education:  Education Completed; Monica [name], Mother - [phone]- has been identified by the patient as the family member/significant other with whom the patient will be residing, and identified as the person(s) who will aid the patient in the event of a mental health crisis (suicidal ideations/suicide attempt).  With written consent from the patient, the family member/significant other has been provided the following suicide prevention education, prior to the and/or following the discharge of the patient.  The suicide prevention education provided includes the following:  Suicide risk factors  Suicide prevention and interventions  National Suicide Hotline telephone number  [facility] assessment telephone number  [facility] Emergency Assistance 911  [facility] and/or Residential Mobile Crisis Unit telephone number  Request made of family/significant other to:  Remove weapons (e.g., guns, rifles, knives), all items previously/currently identified as safety concern.  (Mother reports does not have assess to gun  Remove drugs/medications (over-the-counter, prescriptions, illicit drugs), all items previously/currently identified as a safety concern.  The family member/significant other verbalizes understanding of the suicide prevention education information provided.  The family member/significant other agrees to remove the items of safety concern listed above.  Monica, [name] 02/26/2012, 11:14 AM

## 2012-02-26 NOTE — Progress Notes (Signed)
Psychoeducational Group Note  Date:  02/26/2012 Time:  2000  Group Topic/Focus:  Wrap-Up Group:   The focus of this group is to help patients review their daily goal of treatment and discuss progress on daily workbooks.  Participation Level:  Active  Participation Quality:  Resistant  Affect:  Irritable  Cognitive:  Oriented  Insight:  Limited  Engagement in Group:  Limited  Additional Comments:  Patient shared that her goal was to "figure out what she wanted to do with her life."  Mykeal Carrick, [place] Pigg 02/26/2012, 9:03 PM

## 2012-02-26 NOTE — Progress Notes (Signed)
Patient observed by writer sitting in the dayroom interacting with select peers. Writer spoke with patient concerning her day and she reported that it was a little rocky. When writer asked patient what caused her day to be rocky she reported that during one of her groups they were discussing relationships and she is currently separated and this caused her to be sad. She reports that she wants help with her post pardum depression also. Writer encouraged patient to focus on herself. Patient denies hi/a/v hallucinations but reports passive si and verbally contracts for safety. Safety maintained on unit with 15 min checks, will continue to monitor.

## 2012-02-26 NOTE — Progress Notes (Signed)
Patient ID: Monica [name], female   DOB: 1980/11/01, 31 y.o.   MRN: [id number] Patient ID: Monica [name], female   DOB: Aug 15, 1980, 31 y.o.   MRN: [id number] [facility] MD Progress Note  02/26/2012 1:29 PM LISSY DEUSER  MRN:  [id number]  S: "It is going well for me today. I feel better. My depression is at #4. I am ready to go home to my children. I would also like to change my current provider Hessie Diener watt. I want Dr.Lugo to be my provider after my discharge from here. However, I would like to keep my therapist. No suicidal thoughts".  O: Negative for fever.  HENT: Negative for congestion and rhinorrhea.  Respiratory: Negative for cough, chest tightness and shortness of breath.  Cardiovascular: Negative for chest pain.  Gastrointestinal: Negative for nausea, vomiting and or abdominal pain.  Skin: Negative for rash, bruises and or lacerations.  Neurological: Negative for weakness and headaches   Diagnosis:   Axis I: Post Traumatic Stress Disorder and Bipolar affective disorder, depressed, post partum depresion, PTSD Axis II: Deferred Axis III:  Past Medical History  Diagnosis Date  . Anxiety   . History of pyelonephritis   . History of HPV infection   . Mild dysplasia of cervix (CIN I)   . H/O varicella   . Bacterial vaginosis   . Hypertension   . Anemia   . Hyperemesis arising during pregnancy 2010  . Genital warts 2002  . PTSD (post-traumatic stress disorder)   . Depression   . Bipolar affective disorder    Axis IV: other psychosocial or environmental problems Axis V: 51-60 moderate symptoms  ADL's:  Intact  Sleep: Good  Appetite:  Good  Suicidal Ideation: "no" Plan:  No Intent:  No Means:  No Homicidal Ideation: "No" Plan:  No Intent:  No Means:  No  AEB (as evidenced by): Per patient's reports.  Mental Status Examination/Evaluation: Objective:  Appearance: Casual  Eye Contact::  Good  Speech:  Clear and Coherent  Volume:  Normal  Mood:  Depressed    Affect:  Blunt and Flat  Thought Process:  Coherent  Orientation:  Full  Thought Content:  Rumination  Suicidal Thoughts:  No  Homicidal Thoughts:  No  Memory:  Immediate;   Good Recent;   Good Remote;   Good  Judgement:  Fair  Insight:  Good  Psychomotor Activity:  Normal  Concentration:  Good  Recall:  Good  Akathisia:  No  Handed:  Right  AIMS (if indicated):     Assets:  Desire for Improvement  Sleep:  Number of Hours: 5.75    Vital Signs:Blood pressure 112/80, pulse 90, temperature 98.2 F (36.8 C), temperature source Oral, resp. rate 16, height 5\' 6"  (1.676 m), weight 74.39 kg (164 lb), last menstrual period 01/23/2012. Current Medications: Current Facility-Administered Medications  Medication Dose Route Frequency Provider Last Rate Last Dose  . acetaminophen (TYLENOL) tablet 650 mg  650 mg Oral Q6H PRN Kerry Hough, PA   650 mg at 02/23/12 2440  . alum & mag hydroxide-simeth (MAALOX/MYLANTA) 200-200-20 MG/5ML suspension 30 mL  30 mL Oral Q4H PRN Kerry Hough, PA      . ARIPiprazole (ABILIFY) tablet 2 mg  2 mg Oral Daily Rachael Fee, MD   2 mg at 02/26/12 0748  . buPROPion [facility]) tablet 100 mg  100 mg Oral BID WC Rachael Fee, MD   100 mg at 02/26/12 1202  . escitalopram (LEXAPRO) tablet 10  mg  10 mg Oral Daily Rachael Fee, MD   10 mg at 02/26/12 0748  . feeding supplement (ENSURE COMPLETE) liquid 237 mL  237 mL Oral PRN Ashley Jacobs, RD      . gabapentin (NEURONTIN) capsule 400 mg  400 mg Oral TID PRN Sanjuana Kava, NP   400 mg at 02/26/12 0812  . hydrOXYzine (ATARAX/VISTARIL) tablet 50 mg  50 mg Oral Q2000 Sanjuana Kava, NP   50 mg at 02/25/12 1935  . ibuprofen (ADVIL,MOTRIN) tablet 400 mg  400 mg Oral Q8H PRN Rachael Fee, MD   400 mg at 02/24/12 [id number]  . lamoTRIgine (LAMICTAL) tablet 200 mg  200 mg Oral QHS Rachael Fee, MD   200 mg at 02/25/12 2222  . magnesium hydroxide (MILK OF MAGNESIA) suspension 30 mL  30 mL Oral Daily PRN Kerry Hough, PA       . nicotine (NICODERM CQ - dosed in mg/24 hours) patch 21 mg  21 mg Transdermal Q0600 Kerry Hough, PA   21 mg at 02/26/12 0749  . nystatin cream (MYCOSTATIN)   Topical BID Sanjuana Kava, NP      . temazepam (RESTORIL) capsule 7.5 mg  7.5 mg Oral QHS PRN Nanine Means, NP   7.5 mg at 02/25/12 2223    Lab Results: No results found for this or any previous visit (from the past 48 hour(s)).  Physical Findings: AIMS: Facial and Oral Movements Muscles of Facial Expression: None, normal Lips and Perioral Area: None, normal Jaw: None, normal Tongue: None, normal,Extremity Movements Upper (arms, wrists, hands, fingers): None, normal Lower (legs, knees, ankles, toes): None, normal, Trunk Movements Neck, shoulders, hips: None, normal, Overall Severity Severity of abnormal movements (highest score from questions above): None, normal Incapacitation due to abnormal movements: None, normal Patient's awareness of abnormal movements (rate only patient's report): No Awareness, Dental Status Current problems with teeth and/or dentures?: No Does patient usually wear dentures?: No  CIWA:  CIWA-Ar Total: 0  COWS:  COWS Total Score: 0   Treatment Plan Summary: Daily contact with patient to assess and evaluate symptoms and progress in treatment Medication management  Plan: No new changes made on current treatment regimen. Possible discharge on 02/27/12. Continue current treatment plan.  [name] I 02/26/2012, 1:29 PM

## 2012-02-26 NOTE — Progress Notes (Signed)
D:Pt rates depression as a 0 on 1-10 scale with 10 being the most depressed. Pt requested prn anxiety medication.  A:Supported pt to discuss feelings. Gave prn medication as ordered. Pt is tolerating well R:Pt denies si and hi. Safety maintained on the unit.

## 2012-02-27 MED ORDER — HYDROXYZINE HCL 50 MG PO TABS: 50.0000 mg | ORAL_TABLET | Freq: Every day | ORAL | Status: DC

## 2012-02-27 MED ORDER — IBUPROFEN 200 MG PO TABS: 400.0000 mg | ORAL_TABLET | Freq: Three times a day (TID) | ORAL | Status: DC | PRN

## 2012-02-27 MED ORDER — LAMOTRIGINE 200 MG PO TABS: 200.0000 mg | ORAL_TABLET | Freq: Every day | ORAL | Status: DC

## 2012-02-27 MED ORDER — BUPROPION HCL 100 MG PO TABS: 100.0000 mg | ORAL_TABLET | Freq: Two times a day (BID) | ORAL | Status: DC

## 2012-02-27 MED ORDER — GABAPENTIN 400 MG PO CAPS: 400.0000 mg | ORAL_CAPSULE | Freq: Three times a day (TID) | ORAL | Status: DC | PRN

## 2012-02-27 NOTE — Progress Notes (Signed)
Psychoeducational Group Note  Date:  02/27/2012 Time:  1100  Group Topic/Focus:  Recovery Goals:   The focus of this group is to identify appropriate goals for recovery and establish a plan to achieve them.  Participation Level:  Active  Participation Quality:  Appropriate, Attentive and Sharing  Affect:  Appropriate  Cognitive:  Appropriate  Insight:  Good  Engagement in Group:  Good  Additional Comments:  Patient attended and shared during recovery goals group. Patient defined recovery in personal terms, then stated what recovery looked like for the patient. Patient then was asked to complete my personal recovery goals and within form patient stated what two things the patient would like to change and how it can be changed.  Monica [name] Brittini 02/27/2012, 1:22 PM
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
## 2012-02-27 NOTE — Clinical Social Work Note (Signed)
[facility] Group Notes:  (Counselor/Nursing/MHT/Case Management/Adjunct)        Feelings about Diagnosis    02/27/2012   1:15   Type of Therapy: Group  Participation Level:  Active  Participation Quality:  Attentive  Affect:  Appropriate  Cognitive:  Alert and Appropriate  Insight:  Good  Engagement in Group:  Good  Engagement in Therapy:  Good  Modes of Intervention:  Education, Problem-solving, Support and Exploration  Summary of Progress/Problems: Patient shared knowing her diagnosis educates and reassures her that she is able to get help.   [place], Perra 02/27/2012 2:33 PM

## 2012-02-27 NOTE — Progress Notes (Signed)
  Aftercare Planning Group: 02/27/2012 8:45 AM  Pt attended discharge planning group and actively participated in group.  SW provided pt with today's workbook.  Pt presents with no ratings of depression or anxiety, reporting she is ready and wanting to go home.  Patient reports she has three children at home with her disabled mother and she is ready to return to be there for them. Monica [name] up in the outpatient at [facility] with Dr. Mellody Drown and Baxter Hire (therapist) she would like a new psych MD to follow up with, but continues to want to work with therapist.  Patient will return to her mothers house and is ready to go.   Will follow up with MD regarding DC date an aftercare plan for safe discharge.  [name], LCSW 02/27/2012 9:16 AM

## 2012-02-27 NOTE — Progress Notes (Signed)
Pt resting in bed with eyes closed.  No distress observed.  Respirations even/unlabored.  Safety maintained with q15 minute checks. 

## 2012-02-27 NOTE — Progress Notes (Signed)
Patient ID: Monica [name], female   DOB: 1980/06/25, 31 y.o.   MRN: [id number] She has been discharged home, was picked up by her mother. She voiced understanding of discharge instruction and of follow up plan. She denies thoughts of harming self or others.  All belonging taken home with her.

## 2012-02-27 NOTE — [facility] Suicide Risk Assessment (Signed)
Suicide Risk Assessment  Discharge Assessment     Demographic Factors:  Female, African [place] female  Mental Status Per Nursing Assessment::   On Admission:  Suicidal ideation indicated by patient;Self-harm thoughts  Current Mental Status by Physician: Alert, oriented to 4. Denies AH/VH/HI/SI.  Loss Factors: Post partum depression  Historical Factors: Family history of mental illness or substance abuse and Impulsivity  Risk Reduction Factors:   Responsible for children under 69 years of age, Living with another person, especially a relative and Positive social support  Continued Clinical Symptoms:  Postpartum Depression  Cognitive Features That Contribute To Risk:  Cognitively intact  Suicide Risk:  Minimal: No identifiable suicidal ideation.  Patients presenting with no risk factors but with morbid ruminations; may be classified as minimal risk based on the severity of the depressive symptoms  Discharge Diagnoses:   AXIS I: Bipolar disorder, Post partum Depression  AXIS II:  No diagnosis AXIS III:   Past Medical History  Diagnosis Date  . Anxiety   . History of pyelonephritis   . History of HPV infection   . Mild dysplasia of cervix (CIN I)   . H/O varicella   . Bacterial vaginosis   . Hypertension   . Anemia   . Hyperemesis arising during pregnancy 2010  . Genital warts 2002  . PTSD (post-traumatic stress disorder)   . Depression   . Bipolar affective disorder    AXIS IV:  other psychosocial or environmental problems AXIS V:  61-70 mild symptoms  Plan Of Care/Follow-up recommendations:  Activity:  normal Diet:  normal  Is patient on multiple antipsychotic therapies at discharge:  No   Has Patient had three or more failed trials of antipsychotic monotherapy by history:  No  Recommended Plan for Multiple Antipsychotic Therapies: NA  Monica [name] 02/27/2012, 9:46 AM

## 2012-02-27 NOTE — Progress Notes (Signed)
[facility] Case Management Discharge Plan:  Will you be returning to the same living situation after discharge: Yes,  Patient to return home with parents At discharge, do you have transportation home?:Yes,  Patient to arrange transportation home Do you have the ability to pay for your medications:Yes,  Patient has insurance  Interagency Information:     Release of information consent forms completed and in the chart;  Patient's signature needed at discharge.  Patient to Follow up at:  Follow-up Information    Follow up with Orvan July [facility]. On 02/29/2012. (You are scheduled with Orvan July on Thursday, November 21, [address] at 4:00 PM)    Contact information:   [address] [place], [place]  [address]  [phone]      Follow up with Jorje Guild - [facility]. On 03/25/2012. (You are currently scheduled to see Jorje Guild, [place] on Monday, March 25, 2012 ar 4:15 PM)    Contact information:   [address]  [place], [place]   [address] [phone]         Patient denies SI/HI:   Yes,  Patient is no longer endorsing SI/HI or other throughts of self harm    Safety Planning and Suicide Prevention discussed:  Yes, reviewed during aftercare groups  Barrier to discharge identified:Yes,  Limited support system  Summary and Recommendations:  Patient encouraged to be compliant with medications and to follow up with all outpatient recommendations.    Monica [name], Monica [name] 02/27/2012, 12:41 PM

## 2012-02-27 NOTE — Progress Notes (Signed)
Grief and Loss Group  The group focused on a variety of loss issues including how loss of parents during childhood impacted their lives, losses resulting from addiction, losses of personal dreams and losses related to self expectation. Many members of the group were supportive of each other. There was also a discussion of what some of learned from their losses which has been of help to them.   Pt. Talked about her disillusionment and anger because her marriage did not turn out as she had thought it would ( " live to old age together " ).  She talked about her difficulty bonding with her youngest child, her post-partum depression and her desire to feel closer to her daughter.    Monica [name]
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
## 2012-02-29 ENCOUNTER — Encounter (HOSPITAL_COMMUNITY): Payer: Self-pay | Admitting: Licensed Clinical Social Worker

## 2012-02-29 ENCOUNTER — Ambulatory Visit (HOSPITAL_COMMUNITY): Payer: Managed Care, Other (non HMO) | Admitting: Licensed Clinical Social Worker

## 2012-02-29 NOTE — Progress Notes (Signed)
Patient ID: Monica [name], female   DOB: 1981/01/09, 31 y.o.   MRN: [id number] Patient was a no show today. Called and spoke with patient who forgot about her appointment. Will see her tomorrow at 1pm. Will review no show policy.

## 2012-02-29 NOTE — Discharge Summary (Signed)
Reviewed

## 2012-03-01 ENCOUNTER — Encounter (HOSPITAL_COMMUNITY): Payer: Self-pay | Admitting: Licensed Clinical Social Worker

## 2012-03-01 ENCOUNTER — Ambulatory Visit (INDEPENDENT_AMBULATORY_CARE_PROVIDER_SITE_OTHER): Payer: Managed Care, Other (non HMO) | Admitting: Licensed Clinical Social Worker

## 2012-03-01 DIAGNOSIS — F3130 Bipolar disorder, current episode depressed, mild or moderate severity, unspecified: Secondary | ICD-10-CM

## 2012-03-01 DIAGNOSIS — F319 Bipolar disorder, unspecified: Secondary | ICD-10-CM

## 2012-03-01 NOTE — Progress Notes (Signed)
Patient Discharge Instructions:  After Visit Summary (AVS):   Faxed to:  03/01/12 Psychiatric Admission Assessment Note:   Faxed to:  03/01/12 Suicide Risk Assessment - Discharge Assessment:   Faxed to:  03/01/12 Next Level Care Provider Has Access to the EMR, 03/01/12 Records provided to [facility] via [facility]/Epic Access  Jerelene Redden, 03/01/2012, 3:41 PM

## 2012-03-01 NOTE — Progress Notes (Signed)
   THERAPIST PROGRESS NOTE  Session Time: 1:00pm-1:50pm  Participation Level: Active  Behavioral Response: Well GroomedLethargicAnxious, Depressed and Hopeless  Type of Therapy: Individual Therapy  Treatment Goals addressed: Coping  Interventions: CBT, DBT, Motivational Interviewing, Strength-based, Supportive and Reframing  Summary: Monica [name] is a 31 y.o. female who presents with depressed mood and flat affect. This is patients first appointment since she was discharged from inpatient at [facility] for suicidal ideation. She attended Psych IOP and fid not find this program helpful. She became manic during her time, with increased agitation and anger. She felt flooded by emotions related to her trauma and was unable to process them in group while feeling in control. She reports very mild improvement in her depression, but an increase in her anxiety. She denies any suicidal or homicidal ideation, intent or plan. She denies any thoughts or desire to harm her baby. She is angry about how her life has turned out and feels it is unfair that she now has to raise three children on her own. She does not feel supported by her parents regarding her bi-polar disorder. She is supposed to return to work on Monday, December 2nd, but does not feel ready. She is unable to focus and concentrate and is fearful that if she returns she will be fired for poor performance. She will be unable to attend therapy sessions because of her work schedule. Will discuss extending her leave with Jorje Guild, PA.    Suicidal/Homicidal: Nowithout intent/plan  Therapist Response: Assessed patients current functioning and reviewed progress. Reviewed coping strategies. Assessed patients safety and assisted in identifying protective factors.  Reviewed crisis plan with patient. Assisted patient with the expression of her feelings of anger about her life . Patients thoughts are negative and distorted. She is hopeless and helpless. She is  unable to connect to any sense of power over her life. Used CBT to assist patient with the identification of negative distortions and irrational thoughts. Encouraged patient to verbalize alternative and factual responses which challenge thought distortions. Used DBT to practice mindfulness, review distraction list and improve distress tolerance skills. Used motivational interviewing to assist and encourage patient through the change process. Explored patients barriers to change. Used motivational interviewing to assist and encourage patient through the change process. Explored patients barriers to change.  Reviewed patients self care plan. Assessed  progress related to self care. Patient's self care is fair. Recommend proper diet, regular exercise, socialization and recreation.   Plan: Return again in one weeks.  Diagnosis: Axis I: Bipolar, Depressed    Axis II: No diagnosis    Anjannette Gauger, LCSW 03/01/2012

## 2012-03-03 NOTE — Progress Notes (Signed)
Agree with assessment and plan [place] Brick A. Gina Leblond, M.D. 

## 2012-03-04 ENCOUNTER — Ambulatory Visit (INDEPENDENT_AMBULATORY_CARE_PROVIDER_SITE_OTHER): Payer: Managed Care, Other (non HMO) | Admitting: Licensed Clinical Social Worker

## 2012-03-04 DIAGNOSIS — F3130 Bipolar disorder, current episode depressed, mild or moderate severity, unspecified: Secondary | ICD-10-CM

## 2012-03-04 DIAGNOSIS — F319 Bipolar disorder, unspecified: Secondary | ICD-10-CM
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
## 2012-03-05 ENCOUNTER — Telehealth (HOSPITAL_COMMUNITY): Payer: Self-pay

## 2012-03-12 ENCOUNTER — Encounter (HOSPITAL_COMMUNITY): Payer: Self-pay | Admitting: Licensed Clinical Social Worker

## 2012-03-12 ENCOUNTER — Ambulatory Visit (INDEPENDENT_AMBULATORY_CARE_PROVIDER_SITE_OTHER): Payer: Managed Care, Other (non HMO) | Admitting: Licensed Clinical Social Worker

## 2012-03-12 DIAGNOSIS — F319 Bipolar disorder, unspecified: Secondary | ICD-10-CM
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
## 2012-03-19 ENCOUNTER — Ambulatory Visit (INDEPENDENT_AMBULATORY_CARE_PROVIDER_SITE_OTHER): Payer: Managed Care, Other (non HMO) | Admitting: Licensed Clinical Social Worker

## 2012-03-19 DIAGNOSIS — F3130 Bipolar disorder, current episode depressed, mild or moderate severity, unspecified: Secondary | ICD-10-CM

## 2012-03-19 DIAGNOSIS — F319 Bipolar disorder, unspecified: Secondary | ICD-10-CM
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
## 2012-03-25 ENCOUNTER — Ambulatory Visit (HOSPITAL_COMMUNITY): Payer: Self-pay | Admitting: Physician Assistant

## 2012-03-26 ENCOUNTER — Ambulatory Visit (INDEPENDENT_AMBULATORY_CARE_PROVIDER_SITE_OTHER): Payer: Managed Care, Other (non HMO) | Admitting: Licensed Clinical Social Worker

## 2012-03-26 ENCOUNTER — Encounter (HOSPITAL_COMMUNITY): Payer: Self-pay

## 2012-03-26 DIAGNOSIS — F3130 Bipolar disorder, current episode depressed, mild or moderate severity, unspecified: Secondary | ICD-10-CM

## 2012-03-26 DIAGNOSIS — F4310 Post-traumatic stress disorder, unspecified: Secondary | ICD-10-CM

## 2012-03-26 DIAGNOSIS — O99345 Other mental disorders complicating the puerperium: Secondary | ICD-10-CM

## 2012-03-26 DIAGNOSIS — F329 Major depressive disorder, single episode, unspecified: Secondary | ICD-10-CM

## 2012-03-26 DIAGNOSIS — F319 Bipolar disorder, unspecified: Secondary | ICD-10-CM
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
## 2012-04-04 ENCOUNTER — Ambulatory Visit (INDEPENDENT_AMBULATORY_CARE_PROVIDER_SITE_OTHER): Payer: Managed Care, Other (non HMO) | Admitting: Licensed Clinical Social Worker

## 2012-04-04 ENCOUNTER — Other Ambulatory Visit (HOSPITAL_COMMUNITY): Payer: Self-pay | Admitting: Physician Assistant

## 2012-04-04 DIAGNOSIS — F3130 Bipolar disorder, current episode depressed, mild or moderate severity, unspecified: Secondary | ICD-10-CM

## 2012-04-04 DIAGNOSIS — F319 Bipolar disorder, unspecified: Secondary | ICD-10-CM

## 2012-04-04 MED ORDER — LAMOTRIGINE 200 MG PO TABS: 200.0000 mg | ORAL_TABLET | Freq: Every day | ORAL | Status: DC

## 2012-04-04 MED ORDER — BUPROPION HCL 100 MG PO TABS: 100.0000 mg | ORAL_TABLET | Freq: Two times a day (BID) | ORAL | Status: DC

## 2012-04-04 MED ORDER — GABAPENTIN 400 MG PO CAPS: 400.0000 mg | ORAL_CAPSULE | Freq: Three times a day (TID) | ORAL | Status: DC | PRN

## 2012-04-04 MED ORDER — HYDROXYZINE HCL 50 MG PO TABS: 50.0000 mg | ORAL_TABLET | Freq: Every day | ORAL | Status: DC
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
## 2012-04-09 ENCOUNTER — Ambulatory Visit (INDEPENDENT_AMBULATORY_CARE_PROVIDER_SITE_OTHER): Payer: Managed Care, Other (non HMO) | Admitting: Licensed Clinical Social Worker

## 2012-04-09 DIAGNOSIS — F53 Postpartum depression: Secondary | ICD-10-CM

## 2012-04-09 DIAGNOSIS — F319 Bipolar disorder, unspecified: Secondary | ICD-10-CM

## 2012-04-09 DIAGNOSIS — O99345 Other mental disorders complicating the puerperium: Secondary | ICD-10-CM

## 2012-04-09 DIAGNOSIS — F329 Major depressive disorder, single episode, unspecified: Secondary | ICD-10-CM

## 2012-04-09 DIAGNOSIS — F3130 Bipolar disorder, current episode depressed, mild or moderate severity, unspecified: Secondary | ICD-10-CM
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
## 2012-04-15 ENCOUNTER — Ambulatory Visit (HOSPITAL_COMMUNITY): Payer: Self-pay | Admitting: Licensed Clinical Social Worker

## 2012-04-16 ENCOUNTER — Telehealth (HOSPITAL_COMMUNITY): Payer: Self-pay

## 2012-04-19 ENCOUNTER — Ambulatory Visit (HOSPITAL_COMMUNITY): Payer: Self-pay | Admitting: Licensed Clinical Social Worker

## 2012-04-25 ENCOUNTER — Ambulatory Visit (HOSPITAL_COMMUNITY): Payer: Self-pay | Admitting: Licensed Clinical Social Worker

## 2012-05-02 ENCOUNTER — Ambulatory Visit (HOSPITAL_COMMUNITY): Payer: Self-pay | Admitting: Licensed Clinical Social Worker

## 2012-05-09 ENCOUNTER — Encounter (HOSPITAL_COMMUNITY): Payer: Self-pay

## 2012-05-09 ENCOUNTER — Ambulatory Visit (HOSPITAL_COMMUNITY): Payer: Self-pay | Admitting: Licensed Clinical Social Worker

## 2012-05-09 ENCOUNTER — Encounter (HOSPITAL_COMMUNITY): Payer: Self-pay | Admitting: Licensed Clinical Social Worker

## 2012-05-09 NOTE — Progress Notes (Signed)
Patient ID: Monica [name], female   DOB: 1980-07-01, 32 y.o.   MRN: [id number] Patient was a no show/no call for today's appointment.

## 2012-05-13 ENCOUNTER — Ambulatory Visit (HOSPITAL_COMMUNITY): Payer: Self-pay | Admitting: Physician Assistant

## 2012-09-03 ENCOUNTER — Encounter (HOSPITAL_COMMUNITY): Payer: Self-pay | Admitting: Emergency Medicine

## 2012-09-03 ENCOUNTER — Emergency Department (HOSPITAL_COMMUNITY)
Admission: EM | Admit: 2012-09-03 | Discharge: 2012-09-03 | Disposition: A | Discharge status: Home / Self Care | Payer: Managed Care, Other (non HMO) | Attending: Emergency Medicine | Admitting: Emergency Medicine

## 2012-09-03 DIAGNOSIS — Z91040 Latex allergy status: Secondary | ICD-10-CM | POA: Insufficient documentation

## 2012-09-03 DIAGNOSIS — I10 Essential (primary) hypertension: Secondary | ICD-10-CM | POA: Insufficient documentation

## 2012-09-03 DIAGNOSIS — R112 Nausea with vomiting, unspecified: Secondary | ICD-10-CM

## 2012-09-03 DIAGNOSIS — R630 Anorexia: Secondary | ICD-10-CM | POA: Insufficient documentation

## 2012-09-03 DIAGNOSIS — Z862 Personal history of diseases of the blood and blood-forming organs and certain disorders involving the immune mechanism: Secondary | ICD-10-CM | POA: Insufficient documentation

## 2012-09-03 DIAGNOSIS — F411 Generalized anxiety disorder: Secondary | ICD-10-CM | POA: Insufficient documentation

## 2012-09-03 DIAGNOSIS — Z8669 Personal history of other diseases of the nervous system and sense organs: Secondary | ICD-10-CM | POA: Insufficient documentation

## 2012-09-03 DIAGNOSIS — F319 Bipolar disorder, unspecified: Secondary | ICD-10-CM | POA: Insufficient documentation

## 2012-09-03 DIAGNOSIS — F17200 Nicotine dependence, unspecified, uncomplicated: Secondary | ICD-10-CM | POA: Insufficient documentation

## 2012-09-03 DIAGNOSIS — Z8619 Personal history of other infectious and parasitic diseases: Secondary | ICD-10-CM | POA: Insufficient documentation

## 2012-09-03 DIAGNOSIS — R6883 Chills (without fever): Secondary | ICD-10-CM | POA: Insufficient documentation

## 2012-09-03 DIAGNOSIS — Z3202 Encounter for pregnancy test, result negative: Secondary | ICD-10-CM | POA: Insufficient documentation

## 2012-09-03 DIAGNOSIS — R5381 Other malaise: Secondary | ICD-10-CM | POA: Insufficient documentation

## 2012-09-03 DIAGNOSIS — Z8659 Personal history of other mental and behavioral disorders: Secondary | ICD-10-CM | POA: Insufficient documentation

## 2012-09-03 DIAGNOSIS — R197 Diarrhea, unspecified: Secondary | ICD-10-CM | POA: Insufficient documentation

## 2012-09-03 DIAGNOSIS — Z87410 Personal history of cervical dysplasia: Secondary | ICD-10-CM | POA: Insufficient documentation

## 2012-09-03 DIAGNOSIS — Z87448 Personal history of other diseases of urinary system: Secondary | ICD-10-CM | POA: Insufficient documentation

## 2012-09-03 LAB — CBC WITH DIFFERENTIAL/PLATELET
Basophils Absolute: 0 10*3/uL (ref 0.0–0.1)
Basophils Relative: 0 % (ref 0–1)
Eosinophils Absolute: 0.3 10*3/uL (ref 0.0–0.7)
Eosinophils Relative: 4 % (ref 0–5)
HCT: 39.9 % (ref 36.0–46.0)
Hemoglobin: 13.1 g/dL (ref 12.0–15.0)
Lymphocytes Relative: 21 % (ref 12–46)
Lymphs Abs: 1.3 10*3/uL (ref 0.7–4.0)
MCH: 30.4 pg (ref 26.0–34.0)
MCHC: 32.8 g/dL (ref 30.0–36.0)
MCV: 92.6 fL (ref 78.0–100.0)
Monocytes Absolute: 0.4 10*3/uL (ref 0.1–1.0)
Monocytes Relative: 7 % (ref 3–12)
Neutro Abs: 4.1 10*3/uL (ref 1.7–7.7)
Neutrophils Relative %: 68 % (ref 43–77)
Platelets: 203 10*3/uL (ref 150–400)
RBC: 4.31 MIL/uL (ref 3.87–5.11)
RDW: 12.2 % (ref 11.5–15.5)
WBC: 6 10*3/uL (ref 4.0–10.5)

## 2012-09-03 LAB — COMPREHENSIVE METABOLIC PANEL
ALT: 16 U/L (ref 0–35)
AST: 23 U/L (ref 0–37)
Albumin: 3.9 g/dL (ref 3.5–5.2)
Alkaline Phosphatase: 56 U/L (ref 39–117)
BUN: 12 mg/dL (ref 6–23)
CO2: 22 mEq/L (ref 19–32)
Calcium: 8.9 mg/dL (ref 8.4–10.5)
Chloride: 105 mEq/L (ref 96–112)
Creatinine, Ser: 0.61 mg/dL (ref 0.50–1.10)
GFR calc Af Amer: >90 mL/min (ref >90)
GFR calc non Af Amer: >90 mL/min (ref >90)
Glucose, Bld: 78 mg/dL (ref 70–99)
Potassium: 3.7 mEq/L (ref 3.5–5.1)
Sodium: 138 mEq/L (ref 135–145)
Total Bilirubin: 0.4 mg/dL (ref 0.3–1.2)
Total Protein: 7.6 g/dL (ref 6.0–8.3)

## 2012-09-03 LAB — URINALYSIS, ROUTINE W REFLEX MICROSCOPIC
Glucose, UA: NEGATIVE mg/dL
Hgb urine dipstick: NEGATIVE
Ketones, ur: NEGATIVE mg/dL
Protein, ur: NEGATIVE mg/dL
Urobilinogen, UA: 0.2 mg/dL (ref 0.0–1.0)

## 2012-09-03 LAB — POCT PREGNANCY, URINE: Preg Test, Ur: NEGATIVE

## 2012-09-03 LAB — OCCULT BLOOD, POC DEVICE: Fecal Occult Bld: NEGATIVE

## 2012-09-03 MED ORDER — ONDANSETRON HCL 4 MG PO TABS: 4.0000 mg | ORAL_TABLET | Freq: Four times a day (QID) | ORAL | Status: DC

## 2012-09-03 MED ORDER — ONDANSETRON 4 MG PO TBDP
8.0000 mg | ORAL_TABLET | Freq: Once | ORAL | Status: AC
  Administered 2012-09-03: 8 mg via ORAL

## 2012-09-03 MED ORDER — ONDANSETRON 4 MG PO TBDP
ORAL_TABLET | ORAL | Status: AC
  Filled 2012-09-03: qty 2

## 2012-09-03 MED ORDER — ONDANSETRON 4 MG PO TBDP
8.0000 mg | ORAL_TABLET | Freq: Once | ORAL | Status: AC
  Administered 2012-09-03: 8 mg via ORAL
  Filled 2012-09-03 (×2): qty 1

## 2012-09-03 NOTE — ED Provider Notes (Signed)
History     CSN: [id number]  Arrival date & time 09/03/12  1627   First MD Initiated Contact with Patient 09/03/12 1927      Chief Complaint  Patient presents with  . Melena    (Consider location/radiation/quality/duration/timing/severity/associated sxs/prior treatment) HPI  This is a 32 year old female presenting for 3 days of multiple loose dark stools with associated nausea, chills, and fatigue. Pt reports decreased PO intake. Pt denies abdominal pain, vomiting, lightheadedness, LOC, urinary symptoms, vaginal or pelvic complaints, sick contacts, suspicious food source. Pt endorses three previous cesarean sections without any abdominal or pelvic surgeries in the last year. Denies history of melena.   Past Medical History  Diagnosis Date  . Anxiety   . History of pyelonephritis   . History of HPV infection   . Mild dysplasia of cervix (CIN I)   . H/O varicella   . Bacterial vaginosis   . Hypertension   . Anemia   . Hyperemesis arising during pregnancy 2010  . Genital warts 2002  . PTSD (post-traumatic stress disorder)   . Depression   . Bipolar affective disorder     Past Surgical History  Procedure Laterality Date  . Wisdom tooth extraction    . Cesarean section  2007 & 2010  . Cesarean section  07/14/2011    Procedure: CESAREAN SECTION;  Surgeon: Hal Morales, MD;  Location: [facility];  Service: Gynecology;  Laterality: N/A;  Repat C/S  [facility] 07/21/11  . Scar revision  07/14/2011    Procedure: SCAR REVISION;  Surgeon: Hal Morales, MD;  Location: [facility];  Service: Gynecology;;    Family History  Problem Relation Age of Onset  . Hypertension Mother   . Diabetes Mother   . Depression Mother   . Depression Father   . Suicidality Cousin   . Depression Cousin   . Hypertension Cousin   . Depression Brother   . Hypertension Maternal Aunt   . Heart disease Paternal Aunt   . Cancer Paternal Aunt   . Heart disease Paternal Uncle   . Hypertension Maternal  Grandfather   . Heart disease Maternal Grandfather   . Diabetes Paternal Grandmother   . Kidney disease Paternal Grandmother   . Asthma Paternal Grandmother   . Cancer Paternal Grandmother   . COPD Paternal Grandfather   . Heart disease Paternal Grandfather     History  Substance Use Topics  . Smoking status: Current Every Day Smoker -- 0.50 packs/day for 10 years    Types: Cigarettes  . Smokeless tobacco: Never Used  . Alcohol Use: 4.8 oz/week    8 Cans of beer per week    OB History   Grav Para Term Preterm Abortions TAB SAB Ect Mult Living   5 3 3  2  0 2 0 0 3      Review of Systems  Constitutional: Positive for chills, appetite change and fatigue. Negative for diaphoresis.  HENT: Negative.   Eyes: Negative.   Respiratory: Negative for cough, chest tightness and shortness of breath.   Cardiovascular: Negative for chest pain.  Gastrointestinal: Positive for nausea and diarrhea. Negative for vomiting, abdominal pain, constipation, abdominal distention, anal bleeding and rectal pain.  Genitourinary: Negative.   Musculoskeletal: Negative for back pain.  Skin: Negative.   Neurological: Negative for headaches.    Allergies  Latex  Home Medications   Current Outpatient Rx  Name  Route  Sig  Dispense  Refill  . gabapentin (NEURONTIN) 400 MG capsule  Oral   Take 1 capsule (400 mg total) by mouth 3 (three) times daily as needed (anxiety). For anxiety   90 capsule   1   . ibuprofen (ADVIL,MOTRIN) 200 MG tablet   Oral   Take 2 tablets (400 mg total) by mouth every 8 (eight) hours as needed. For mild/moderate pain.   30 tablet      . ondansetron (ZOFRAN) 4 MG tablet   Oral   Take 1 tablet (4 mg total) by mouth every 6 (six) hours.   12 tablet   0     BP 107/65  Pulse 84  Temp(Src) 98.2 F (36.8 C) (Oral)  Resp 18  SpO2 98%  Physical Exam  Constitutional: She is oriented to person, place, and time. She appears well-developed and well-nourished. No  distress.  HENT:  Head: Normocephalic and atraumatic.  Mouth/Throat: Oropharynx is clear and moist.  Eyes: Conjunctivae are normal.  Neck: Neck supple.  Cardiovascular: Normal rate, regular rhythm and normal heart sounds.   Pulmonary/Chest: Effort normal and breath sounds normal.  Abdominal: Soft. Bowel sounds are normal. She exhibits no distension and no mass. There is no tenderness. There is no rebound and no guarding.  Genitourinary: Rectum normal.  No frank melana  Lymphadenopathy:    She has no cervical adenopathy.  Neurological: She is alert and oriented to person, place, and time.  Skin: Skin is warm and dry. She is not diaphoretic.  Psychiatric: She has a normal mood and affect.    ED Course  Procedures (including critical care time)  Labs Reviewed  URINALYSIS, ROUTINE W REFLEX MICROSCOPIC - Abnormal; Notable for the following:    Specific Gravity, Urine 1.038 (*)    Bilirubin Urine SMALL (*)    All other components within normal limits  CBC WITH DIFFERENTIAL  COMPREHENSIVE METABOLIC PANEL  POCT PREGNANCY, URINE  OCCULT BLOOD, POC DEVICE   No results found.   1. Nausea & vomiting   2. Diarrhea       MDM  Patient with symptoms consistent with viral gastroenteritis.  Vitals are stable, no fever.  No signs of dehydration, tolerating PO fluids > 6 oz.  Lungs are clear.  Non-surgical abdomen on examination. No focal abdominal pain, no concern for appendicitis, cholecystitis, pancreatitis, ruptured viscus, UTI, kidney stone, or any other abdominal etiology.  Supportive therapy indicated with return if symptoms worsen.  Patient counseled. Patient d/w with Dr. Manus Gunning, agrees with plan. Patient is stable at time of discharge.           Jeannetta Ellis, PA-C 09/04/12 1145

## 2012-09-03 NOTE — ED Notes (Signed)
Patient passing Dark tarry stools, starting on Sunday. Nausea and diarrhea. Poor PO. General weakness. Felt "hot" at home. NO vomiting, urinary Sx.

## 2012-09-04 NOTE — ED Provider Notes (Signed)
Medical screening examination/treatment/procedure(s) were performed by non-physician practitioner and as supervising physician I was immediately available for consultation/collaboration.   Glynn Octave, MD 09/04/12 1149

## 2014-02-09 ENCOUNTER — Encounter (HOSPITAL_COMMUNITY): Payer: Self-pay | Admitting: Emergency Medicine

## 2015-05-07 ENCOUNTER — Other Ambulatory Visit: Payer: Self-pay | Admitting: Obstetrics and Gynecology

## 2015-05-07 DIAGNOSIS — E049 Nontoxic goiter, unspecified: Secondary | ICD-10-CM

## 2015-05-12 ENCOUNTER — Ambulatory Visit
Admission: RE | Admit: 2015-05-12 | Discharge: 2015-05-12 | Disposition: A | Discharge status: Home / Self Care | Payer: BLUE CROSS/BLUE SHIELD | Source: Ambulatory Visit | Attending: Obstetrics and Gynecology | Admitting: Obstetrics and Gynecology

## 2015-05-12 DIAGNOSIS — E049 Nontoxic goiter, unspecified: Secondary | ICD-10-CM

## 2015-05-19 ENCOUNTER — Other Ambulatory Visit: Payer: Self-pay | Admitting: Internal Medicine

## 2015-05-19 DIAGNOSIS — E041 Nontoxic single thyroid nodule: Secondary | ICD-10-CM

## 2015-06-01 ENCOUNTER — Ambulatory Visit
Admission: RE | Admit: 2015-06-01 | Discharge: 2015-06-01 | Disposition: A | Discharge status: Home / Self Care | Payer: BLUE CROSS/BLUE SHIELD | Source: Ambulatory Visit | Attending: Internal Medicine | Admitting: Internal Medicine

## 2015-06-01 ENCOUNTER — Other Ambulatory Visit (HOSPITAL_COMMUNITY)
Admission: RE | Admit: 2015-06-01 | Discharge: 2015-06-01 | Disposition: A | Discharge status: Home / Self Care | Payer: BLUE CROSS/BLUE SHIELD | Source: Ambulatory Visit | Attending: Physician Assistant | Admitting: Physician Assistant

## 2015-06-01 DIAGNOSIS — E041 Nontoxic single thyroid nodule: Secondary | ICD-10-CM

## 2015-06-01 NOTE — Procedures (Signed)
Using direct ultrasound guidance, 3 passes were made using needles into the nodules within the left lobe, the right, and the isthmus of the thyroid.   Ultrasound was used to confirm needle placements on all occasions.   Specimens were sent to Pathology for analysis.   Avira Tillison S Morty Ortwein PA-C

## 2015-06-17 ENCOUNTER — Inpatient Hospital Stay (HOSPITAL_COMMUNITY): Payer: BLUE CROSS/BLUE SHIELD

## 2015-06-17 ENCOUNTER — Inpatient Hospital Stay (HOSPITAL_COMMUNITY)
Admission: AD | Admit: 2015-06-17 | Discharge: 2015-06-17 | Disposition: A | Discharge status: Home / Self Care | Payer: BLUE CROSS/BLUE SHIELD | Source: Ambulatory Visit | Attending: Obstetrics and Gynecology | Admitting: Obstetrics and Gynecology

## 2015-06-17 ENCOUNTER — Encounter (HOSPITAL_COMMUNITY): Payer: Self-pay | Admitting: *Deleted

## 2015-06-17 DIAGNOSIS — O209 Hemorrhage in early pregnancy, unspecified: Secondary | ICD-10-CM | POA: Insufficient documentation

## 2015-06-17 DIAGNOSIS — Z3A01 Less than 8 weeks gestation of pregnancy: Secondary | ICD-10-CM | POA: Diagnosis not present

## 2015-06-17 DIAGNOSIS — R58 Hemorrhage, not elsewhere classified: Secondary | ICD-10-CM

## 2015-06-17 DIAGNOSIS — Z87891 Personal history of nicotine dependence: Secondary | ICD-10-CM | POA: Insufficient documentation

## 2015-06-17 DIAGNOSIS — R109 Unspecified abdominal pain: Secondary | ICD-10-CM | POA: Diagnosis present

## 2015-06-17 HISTORY — DX: Headache, unspecified: R51.9

## 2015-06-17 HISTORY — DX: Nontoxic single thyroid nodule: E04.1

## 2015-06-17 HISTORY — DX: Headache: R51

## 2015-06-17 LAB — CBC WITH DIFFERENTIAL/PLATELET
Basophils Absolute: 0 K/uL (ref 0.0–0.1)
Basophils Relative: 0 %
Eosinophils Absolute: 0.1 K/uL (ref 0.0–0.7)
Eosinophils Relative: 3 %
HCT: 33.3 % — ABNORMAL LOW (ref 36.0–46.0)
Hemoglobin: 10.9 g/dL — ABNORMAL LOW (ref 12.0–15.0)
Lymphocytes Relative: 45 %
Lymphs Abs: 2 K/uL (ref 0.7–4.0)
MCH: 29.9 pg (ref 26.0–34.0)
MCHC: 32.7 g/dL (ref 30.0–36.0)
MCV: 91.5 fL (ref 78.0–100.0)
Monocytes Absolute: 0.3 K/uL (ref 0.1–1.0)
Monocytes Relative: 7 %
Neutro Abs: 2 K/uL (ref 1.7–7.7)
Neutrophils Relative %: 45 %
Platelets: 203 K/uL (ref 150–400)
RBC: 3.64 MIL/uL — ABNORMAL LOW (ref 3.87–5.11)
RDW: 12.3 % (ref 11.5–15.5)
WBC: 4.5 K/uL (ref 4.0–10.5)

## 2015-06-17 LAB — HCG, QUANTITATIVE, PREGNANCY: hCG, Beta Chain, Quant, S: 705 m[IU]/mL — ABNORMAL HIGH

## 2015-06-17 LAB — WET PREP, GENITAL
Clue Cells Wet Prep HPF POC: NONE SEEN
Sperm: NONE SEEN
Trich, Wet Prep: NONE SEEN
Yeast Wet Prep HPF POC: NONE SEEN

## 2015-06-17 LAB — URINALYSIS, ROUTINE W REFLEX MICROSCOPIC
BILIRUBIN URINE: NEGATIVE
Glucose, UA: NEGATIVE mg/dL
HGB URINE DIPSTICK: NEGATIVE
KETONES UR: 40 mg/dL — AB
Leukocytes, UA: NEGATIVE
NITRITE: NEGATIVE
PROTEIN: NEGATIVE mg/dL
pH: 5.5 (ref 5.0–8.0)

## 2015-06-17 LAB — POCT PREGNANCY, URINE: Preg Test, Ur: POSITIVE — AB

## 2015-06-17 NOTE — Discharge Instructions (Signed)

## 2015-06-17 NOTE — MAU Note (Signed)
Pt had positive HPT . reports she has been having cramping for several weeks. Stared havig some bleeding earlier this morning.

## 2015-06-17 NOTE — MAU Provider Note (Signed)
Monica [name] is a 35 y.o. G5P3013 at 4.3 weeks c/o cramping for several weeks that had increased today also potting started today. She reports she thought her period was coming when she started cramping but it didn't so she took 3 home pregnancy test.      History     There are no active problems to display for this patient.   Chief Complaint  Patient presents with  . Vaginal Bleeding   HPI  OB History    Gravida Para Term Preterm AB TAB SAB Ectopic Multiple Living   5 3   1     3       Past Medical History  Diagnosis Date  . Thyroid nodule   . Headache     Past Surgical History  Procedure Laterality Date  . Cesarean section      History reviewed. No pertinent family history.  Social History  Substance Use Topics  . Smoking status: Former Smoker    Quit date: 06/07/2015  . Smokeless tobacco: None  . Alcohol Use: Yes     Comment: socially    Allergies:  Allergies  Allergen Reactions  . Latex Rash    No prescriptions prior to admission    ROS See HPI above, all other systems are negative  Physical Exam   Blood pressure 122/81, pulse 88, temperature 98.1 F (36.7 C), temperature source Oral, resp. rate 18, height 5\' 7"  (1.702 m), weight 180 lb 12.8 oz (82.01 kg), last menstrual period 05/17/2015.  Physical Exam Ext:  WNL ABD: Soft, non tender to palpation, no rebound or guarding SVE: c/t/h White/brown creamy discharged   ED Course  Assessment: IUP at  4.3weeks Membranes: intact FHR: n/a   MAU Addendum Note  Results for orders placed or performed during the hospital encounter of 06/17/15 (from the past 24 hour(s))  Urinalysis, Routine w reflex microscopic (not at [facility])     Status: Abnormal   Collection Time: 06/17/15  4:05 PM  Result Value Ref Range   Color, Urine YELLOW YELLOW   APPearance CLEAR CLEAR   Specific Gravity, Urine >1.030 (H) 1.005 - 1.030   pH 5.5 5.0 - 8.0   Glucose, UA NEGATIVE NEGATIVE mg/dL   Hgb urine dipstick  NEGATIVE NEGATIVE   Bilirubin Urine NEGATIVE NEGATIVE   Ketones, ur 40 (A) NEGATIVE mg/dL   Protein, ur NEGATIVE NEGATIVE mg/dL   Nitrite NEGATIVE NEGATIVE   Leukocytes, UA NEGATIVE NEGATIVE  Pregnancy, urine POC     Status: Abnormal   Collection Time: 06/17/15  4:12 PM  Result Value Ref Range   Preg Test, Ur POSITIVE (A) NEGATIVE  Wet prep, genital     Status: Abnormal   Collection Time: 06/17/15  5:00 PM  Result Value Ref Range   Yeast Wet Prep HPF POC NONE SEEN NONE SEEN   Trich, Wet Prep NONE SEEN NONE SEEN   Clue Cells Wet Prep HPF POC NONE SEEN NONE SEEN   WBC, Wet Prep HPF POC FEW (A) NONE SEEN   Sperm NONE SEEN   CBC with Differential/Platelet     Status: Abnormal   Collection Time: 06/17/15  6:21 PM  Result Value Ref Range   WBC 4.5 4.0 - 10.5 K/uL   RBC 3.64 (L) 3.87 - 5.11 MIL/uL   Hemoglobin 10.9 (L) 12.0 - 15.0 g/dL   HCT 33.3 (L) 36.0 - 46.0 %   MCV 91.5 78.0 - 100.0 fL   MCH 29.9 26.0 - 34.0 pg   MCHC 32.7  30.0 - 36.0 g/dL   RDW 12.3 11.5 - 15.5 %   Platelets 203 150 - 400 K/uL   Neutrophils Relative % 45 %   Neutro Abs 2.0 1.7 - 7.7 K/uL   Lymphocytes Relative 45 %   Lymphs Abs 2.0 0.7 - 4.0 K/uL   Monocytes Relative 7 %   Monocytes Absolute 0.3 0.1 - 1.0 K/uL   Eosinophils Relative 3 %   Eosinophils Absolute 0.1 0.0 - 0.7 K/uL   Basophils Relative 0 %   Basophils Absolute 0.0 0.0 - 0.1 K/uL  hCG, quantitative, pregnancy     Status: Abnormal   Collection Time: 06/17/15  6:21 PM  Result Value Ref Range   hCG, Beta Chain, Quant, S 705 (H) <5 mIU/mL     Plan: -Discussed need to follow up in office for a FU [place] in 14days -Bleeding and PTL Precautions -Encouraged to call if any questions or concerns arise prior to next scheduled office visit.  -Discharged to home in stable condition Consulted with Dr.   Alvera Novel Shaquan Missey, CNM, MSN 06/17/2015. 7:24 PM

## 2015-06-18 LAB — GC/CHLAMYDIA PROBE AMP (~~LOC~~) NOT AT ARMC
Chlamydia: NEGATIVE
NEISSERIA GONORRHEA: NEGATIVE

## 2015-06-24 LAB — OB RESULTS CONSOLE HIV ANTIBODY (ROUTINE TESTING): HIV: NONREACTIVE

## 2015-06-24 LAB — OB RESULTS CONSOLE RPR: RPR: NONREACTIVE

## 2015-06-24 LAB — OB RESULTS CONSOLE RUBELLA ANTIBODY, IGM: Rubella: IMMUNE

## 2015-06-24 LAB — OB RESULTS CONSOLE GC/CHLAMYDIA
CHLAMYDIA, DNA PROBE: NEGATIVE
Gonorrhea: NEGATIVE

## 2015-06-24 LAB — OB RESULTS CONSOLE ABO/RH: RH Type: POSITIVE

## 2015-06-24 LAB — OB RESULTS CONSOLE HEPATITIS B SURFACE ANTIGEN: Hepatitis B Surface Ag: NEGATIVE

## 2015-06-24 LAB — OB RESULTS CONSOLE ANTIBODY SCREEN: Antibody Screen: NEGATIVE

## 2015-07-29 ENCOUNTER — Encounter (HOSPITAL_COMMUNITY): Payer: Self-pay

## 2015-07-29 ENCOUNTER — Inpatient Hospital Stay (HOSPITAL_COMMUNITY)
Admission: AD | Admit: 2015-07-29 | Discharge: 2015-07-29 | Disposition: A | Discharge status: Home / Self Care | Payer: BLUE CROSS/BLUE SHIELD | Source: Ambulatory Visit | Attending: Obstetrics and Gynecology | Admitting: Obstetrics and Gynecology

## 2015-07-29 DIAGNOSIS — Z87891 Personal history of nicotine dependence: Secondary | ICD-10-CM | POA: Insufficient documentation

## 2015-07-29 DIAGNOSIS — O418X10 Other specified disorders of amniotic fluid and membranes, first trimester, not applicable or unspecified: Secondary | ICD-10-CM

## 2015-07-29 DIAGNOSIS — Z3A10 10 weeks gestation of pregnancy: Secondary | ICD-10-CM | POA: Insufficient documentation

## 2015-07-29 DIAGNOSIS — O209 Hemorrhage in early pregnancy, unspecified: Secondary | ICD-10-CM | POA: Insufficient documentation

## 2015-07-29 DIAGNOSIS — O468X1 Other antepartum hemorrhage, first trimester: Secondary | ICD-10-CM

## 2015-07-29 LAB — URINE MICROSCOPIC-ADD ON: WBC, UA: NONE SEEN WBC/hpf (ref 0–5)

## 2015-07-29 LAB — URINALYSIS, ROUTINE W REFLEX MICROSCOPIC
Bilirubin Urine: NEGATIVE
GLUCOSE, UA: NEGATIVE mg/dL
KETONES UR: NEGATIVE mg/dL
LEUKOCYTES UA: NEGATIVE
Nitrite: NEGATIVE
PH: 7.5 (ref 5.0–8.0)
Protein, ur: NEGATIVE mg/dL
Specific Gravity, Urine: 1.015 (ref 1.005–1.030)

## 2015-07-29 LAB — WET PREP, GENITAL
Clue Cells Wet Prep HPF POC: NONE SEEN
Sperm: NONE SEEN
TRICH WET PREP: NONE SEEN
YEAST WET PREP: NONE SEEN

## 2015-07-29 LAB — GC/CHLAMYDIA PROBE AMP (~~LOC~~) NOT AT ARMC
Chlamydia: NEGATIVE
Neisseria Gonorrhea: NEGATIVE

## 2015-07-29 NOTE — MAU Note (Signed)
Pt states that she began having vaginal bleeding around 30-45 minutes prior to coming in. Denies pain, but feels weak.

## 2015-07-29 NOTE — MAU Provider Note (Signed)
History    Monica [name] is a 34y.o. [id number] at 10.3wks who presents, unannounced, for vaginal bleeding.  Patient reports that bleeding started 45 minutes prior to arrival and after sexual intercourse.  Patient denies cramping, diarrhea, or problems with urination.  Patient does reveal that she was diagnosed with a [facility] in early pregnancy and was placed on pelvic rest.  Patient states that she understands and has been compliant with pelvic rest until tonight.    There are no active problems to display for this patient.   Chief Complaint  Patient presents with  . Vaginal Bleeding   HPI  OB History    Gravida Para Term Preterm AB TAB SAB Ectopic Multiple Living   5 3   1     3       Past Medical History  Diagnosis Date  . Thyroid nodule   . Headache     Past Surgical History  Procedure Laterality Date  . Cesarean section      History reviewed. No pertinent family history.  Social History  Substance Use Topics  . Smoking status: Former Smoker    Quit date: 06/07/2015  . Smokeless tobacco: None  . Alcohol Use: Yes     Comment: socially    Allergies:  Allergies  Allergen Reactions  . Latex Rash    Prescriptions prior to admission  Medication Sig Dispense Refill Last Dose  . cetirizine (ZYRTEC) 10 MG tablet Take 10 mg by mouth daily.   07/28/2015 at Unknown time  . Prenatal Vit-Fe Fumarate-FA (PRENATAL MULTIVITAMIN) TABS tablet Take 1 tablet by mouth daily at 12 noon.   07/28/2015 at Unknown time  . b complex vitamins tablet Take 1 tablet by mouth daily.   06/16/2015 at Unknown time  . busPIRone (BUSPAR) 15 MG tablet Take 15 mg by mouth 2 (two) times daily as needed (anxiety).    06/16/2015 at Unknown time  . ibuprofen (ADVIL,MOTRIN) 200 MG tablet Take 400 mg by mouth every 6 (six) hours as needed for headache.   06/16/2015 at Unknown time  . MAGNESIUM PO Take 1 tablet by mouth daily.   06/16/2015 at Unknown time  . Vitamin D, Ergocalciferol, (DRISDOL) 50000 units CAPS  capsule Take 50,000 Units by mouth every 7 (seven) days.   Past Week at Unknown time    ROS  See HPI Above Physical Exam   Height 5\' 5"  (1.651 m), weight 86.637 kg (191 lb), last menstrual period 05/17/2015.  Results for orders placed or performed during the hospital encounter of 07/29/15 (from the past 24 hour(s))  Urinalysis, Routine w reflex microscopic (not at [facility])     Status: Abnormal   Collection Time: 07/29/15  4:12 AM  Result Value Ref Range   Color, Urine YELLOW YELLOW   APPearance CLEAR CLEAR   Specific Gravity, Urine 1.015 1.005 - 1.030   pH 7.5 5.0 - 8.0   Glucose, UA NEGATIVE NEGATIVE mg/dL   Hgb urine dipstick LARGE (A) NEGATIVE   Bilirubin Urine NEGATIVE NEGATIVE   Ketones, ur NEGATIVE NEGATIVE mg/dL   Protein, ur NEGATIVE NEGATIVE mg/dL   Nitrite NEGATIVE NEGATIVE   Leukocytes, UA NEGATIVE NEGATIVE  Urine microscopic-add on     Status: Abnormal   Collection Time: 07/29/15  4:12 AM  Result Value Ref Range   Squamous Epithelial / LPF 0-5 (A) NONE SEEN   WBC, UA NONE SEEN 0 - 5 WBC/hpf   RBC / HPF TOO NUMEROUS TO COUNT 0 - 5 RBC/hpf   Bacteria,  UA RARE (A) NONE SEEN   Sperm, UA PRESENT     Physical Exam  Vitals reviewed. Constitutional: She is oriented to person, place, and time. She appears well-developed and well-nourished.  HENT:  Head: Normocephalic and atraumatic.  Eyes: Conjunctivae are normal.  Neck: Normal range of motion.  Cardiovascular: Normal rate.   Respiratory: Effort normal.  GI: Soft.  Genitourinary: Cervix exhibits no motion tenderness, no discharge and no friability. There is bleeding in the vagina.  Sterile Speculum Exam: -Introitus: Scant amt blood noted.  -Vaginal Vault: Small amt dark red blood-wet prep collected -Cervix:Appears closed, no active bleeding from os-GC/CT collected -Bimanual Exam: Closed/Long/Thick/Ballotable  Musculoskeletal: Normal range of motion.  Neurological: She is alert and oriented to person, place, and time.   Skin: Skin is warm and dry.     FHR: 170 by doppler UC: None ED Course  Assessment: IUP at 10.3wks Vaginal Bleeding Known [facility] S/P Sexual Encounter  Plan: -Discussed known [place], symptoms, and POC -Educated regarding need for continued pelvic rest until released by provider via negative US exam -Labs to r/o other causes: Wet Prep and GC/CT collected  Follow Up (0604) -Wet prep negative -Gc/Ct pending -Repeat doppler 166 bpm -Reiterated need for continued pelvic rest  -Bleeding Precautions -No q/c -Keep appt as scheduled: 5/8 -Plan repeat US at 5/8 office visit -Encouraged to call if any questions or concerns arise prior to next scheduled office visit.  -Discharged to home in stable condition  [place], MSN 07/29/2015 5:25 AM

## 2015-07-29 NOTE — Discharge Instructions (Signed)
Pelvic Rest Pelvic rest is sometimes recommended for women when:   The placenta is partially or completely covering the opening of the cervix (placenta previa).  There is bleeding between the uterine wall and the amniotic sac in the first trimester (subchorionic hemorrhage).  The cervix begins to open without labor starting (incompetent cervix, cervical insufficiency).  The labor is too early (preterm labor). HOME CARE INSTRUCTIONS  Do not have sexual intercourse, stimulation, or an orgasm.  Do not use tampons, douche, or put anything in the vagina.  Do not lift anything over 10 pounds (4.5 kg).  Avoid strenuous activity or straining your pelvic muscles. SEEK MEDICAL CARE IF:  You have any vaginal bleeding during pregnancy. Treat this as a potential emergency.  You have cramping pain felt low in the stomach (stronger than menstrual cramps).  You notice vaginal discharge (watery, mucus, or bloody).  You have a low, dull backache.  There are regular contractions or uterine tightening. SEEK IMMEDIATE MEDICAL CARE IF: You have vaginal bleeding and have placenta previa.    This information is not intended to replace advice given to you by your health care provider. Make sure you discuss any questions you have with your health care provider.   Document Released: 07/22/2010 Document Revised: 06/19/2011 Document Reviewed: 09/28/2014 Elsevier Interactive Patient Education 2016 [place] Hematoma A subchorionic hematoma is a gathering of blood between the outer wall of the placenta and the inner wall of the womb (uterus). The placenta is the organ that connects the fetus to the wall of the uterus. The placenta performs the feeding, breathing (oxygen to the fetus), and waste removal (excretory work) of the fetus.  Subchorionic hematoma is the most common abnormality found on a result from ultrasonography done during the first trimester or early second trimester of  pregnancy. If there has been little or no vaginal bleeding, early small hematomas usually shrink on their own and do not affect your baby or pregnancy. The blood is gradually absorbed over 1-2 weeks. When bleeding starts later in pregnancy or the hematoma is larger or occurs in an older pregnant woman, the outcome may not be as good. Larger hematomas may get bigger, which increases the chances for miscarriage. Subchorionic hematoma also increases the risk of premature detachment of the placenta from the uterus, preterm (premature) labor, and stillbirth. HOME CARE INSTRUCTIONS  Stay on bed rest if your health care provider recommends this. Although bed rest will not prevent more bleeding or prevent a miscarriage, your health care provider may recommend bed rest until you are advised otherwise.  Avoid heavy lifting (more than 10 lb [4.5 kg]), exercise, sexual intercourse, or douching as directed by your health care provider.  Keep track of the number of pads you use each day and how soaked (saturated) they are. Write down this information.  Do not use tampons.  Keep all follow-up appointments as directed by your health care provider. Your health care provider may ask you to have follow-up blood tests or ultrasound tests or both. SEEK IMMEDIATE MEDICAL CARE IF:  You have severe cramps in your stomach, back, abdomen, or pelvis.  You have a fever.  You pass large clots or tissue. Save any tissue for your health care provider to look at.  Your bleeding increases or you become lightheaded, feel weak, or have fainting episodes.   This information is not intended to replace advice given to you by your health care provider. Make sure you discuss any questions you have with  your health care provider.   Document Released: 07/12/2006 Document Revised: 04/17/2014 Document Reviewed: 10/24/2012 Elsevier Interactive Patient Education [employer].

## 2015-09-22 ENCOUNTER — Encounter: Payer: Self-pay | Admitting: Neurology

## 2015-09-22 ENCOUNTER — Ambulatory Visit (INDEPENDENT_AMBULATORY_CARE_PROVIDER_SITE_OTHER): Payer: BLUE CROSS/BLUE SHIELD | Admitting: Neurology

## 2015-09-22 VITALS — BP 92/60 | HR 80 | Resp 20 | Ht 66.0 in | Wt 192.0 lb

## 2015-09-22 DIAGNOSIS — D34 Benign neoplasm of thyroid gland: Secondary | ICD-10-CM | POA: Diagnosis not present

## 2015-09-22 DIAGNOSIS — Z3490 Encounter for supervision of normal pregnancy, unspecified, unspecified trimester: Secondary | ICD-10-CM

## 2015-09-22 DIAGNOSIS — G43511 Persistent migraine aura without cerebral infarction, intractable, with status migrainosus: Secondary | ICD-10-CM

## 2015-09-22 MED ORDER — BUTALBITAL-APAP-CAFF-COD 50-325-40-30 MG PO CAPS: 1.0000 | ORAL_CAPSULE | ORAL | Status: DC | PRN

## 2015-09-22 MED ORDER — CYPROHEPTADINE HCL 4 MG PO TABS: 4.0000 mg | ORAL_TABLET | Freq: Two times a day (BID) | ORAL | Status: DC

## 2015-09-22 NOTE — Progress Notes (Signed)
Neurological  CLINIC   Provider:  Melvyn Novas, M D  Referring Provider: Osborn Coho, MD Primary Care Physician:  Verlon Au, MD  Chief Complaint  Patient presents with  . New Patient (Initial Visit)    headaches, currently pregnant, rm 11, alone    HPI:  Monica [name] is a 35 y.o. female , seen here as a referral from Dr. Su Hilt for a headache evaluation,  Monica [name] reports that her headaches for which she sees me today begun about a year ago. At the time she decided to stop smoking and use nicotine patches. She also exercised regularly and she lost 15 pounds. Her headaches were present but a little less intense after she had lost weight. She begun taking over-the-counter nonsteroidals to control the headaches but soon realized that she was taking  ibuprofen daily. And of January 2017 she saw her gynecologist for a Pap smear. And she noted during the exam that the thyroid was enlarged. She was seen by endocrinology soon after, she was found to have a goiter with nodules that were non-cancerous. Her endocrinologist found that her TSH was was normal but T3 and T4 were reduced. Her endocrinologist therefore found that a neurology evaluation is necessary to see if the thyroid dysfunction as cause of the headaches are not. The patient is currently [redacted] weeks pregnant- this is her fourth pregnancy.  Monica [name] reports that her headaches are associated with visual distortion it is difficult for her to judge the disturbance parts of the picture may be in and out of focus she also has often a visual aura before the headaches that on in full force usually fortification lines, bright lights and firework sensations. Her headaches are radiating from behind the eye with and retro-orbital pressure sensation to the temple and for head and to the nape of the neck. When her headaches are radiating she will have "whole head hurting. She also has a lot of nausea with this. She feels  tired and fatigued and sluggish even before she got pregnant and is concerned that this is from the thyroid is well. I was able to review the patient's initial labs, she is restless positive antibody screen was negative, hemoglobin was 11.6 there is a negative sexual disease screen. At this time Monica [name] feels exhausted and just bad. She would like to get rid of her headaches and she would like to be evaluated by neurology to see if also her fatigue and malaise are related to a neuroendocrine disorder.   Chief complaint according to patient : HEADACHES, migrainously, with aura and with status .  Was taking Ibuprofen daily- recently changed to butalbutal with caffeine. Transformed migraine.   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 She goes to bed usually between 9 and 9:30 PM, her children go to bed earlier at 8:30. She does not have trouble falling asleep she wakes up frequently throughout the night often related to anxiety and sometimes due to the urge to urinate. She wakens at 6 AM regularly to get ready for work and gets her kids to school. She is a single living alone with her 3 children. Her daytime is busy and she has no time to nap. Social history:  Unmarried, expecting her 4 th child. She has discontinued caffeine was in the first couple of months of  her pregnancy, she does know longer smoke, no alcohol use in pregnancy.  Review of Systems: Out of a complete 14 system review, the patient complains of only the following symptoms, and all other reviewed systems are negative. See above   Social History   Social History  . Marital Status: Divorced    Spouse Name: N/A  . Number of Children: N/A  . Years of Education: N/A   Occupational History  . Not on file.   Social History Main Topics  . Smoking status: Former Smoker    Quit  date: 06/07/2015  . Smokeless tobacco: Not on file  . Alcohol Use: Yes     Comment: socially  . Drug Use: No  . Sexual Activity: Yes   Other Topics Concern  . Not on file   Social History Narrative    No family history on file.  Past Medical History  Diagnosis Date  . Thyroid nodule   . Headache   . Obesity   . Migraine     Past Surgical History  Procedure Laterality Date  . Cesarean section      Current Outpatient Prescriptions  Medication Sig Dispense Refill  . Adapalene 0.3 % gel Apply topically at bedtime.    . busPIRone (BUSPAR) 10 MG tablet Take 10 mg by mouth 2 (two) times daily.    . butalbital-acetaminophen-caffeine (FIORICET WITH CODEINE) 50-325-40-30 MG capsule Take 1 capsule by mouth every 4 (four) hours as needed for headache.    Marland Kitchen MAGNESIUM PO Take 1 tablet by mouth daily.    . ondansetron (ZOFRAN-ODT) 4 MG disintegrating tablet Take 4 mg by mouth every 8 (eight) hours as needed for nausea or vomiting.    . Prenatal Vit-Fe Fumarate-FA (PRENATAL MULTIVITAMIN) TABS tablet Take 1 tablet by mouth daily at 12 noon.     No current facility-administered medications for this visit.    Allergies as of 09/22/2015 - Review Complete 09/22/2015  Allergen Reaction Noted  . Latex Rash 06/17/2015    Vitals: BP 92/60 mmHg  Pulse 80  Resp 20  Ht 5\' 6"  (1.676 m)  Wt 192 lb (87.091 kg)  BMI 31.00 kg/m2  LMP 05/17/2015 Last Weight:  Wt Readings from Last 1 Encounters:  09/22/15 192 lb (87.091 kg)   WUJ:WJXB mass index is 31 kg/(m^2).     Last Height:   Ht Readings from Last 1 Encounters:  09/22/15 5\' 6"  (1.676 m)    Physical exam:  General: The patient is awake, alert and appears not in acute distress. The patient is well groomed. Head: Normocephalic, atraumatic. Neck is supple. Mallampati  2 Cardiovascular:  Regular rate and rhythm, without  murmurs or carotid bruit, and without distended neck veins. Respiratory: Lungs are clear to auscultation. Skin:   Without evidence of edema, or rash Trunk:. The patient's posture is slightly hunched.  Neurologic exam : The patient is awake and alert, oriented to place and time.   Memory subjective described as intact. Attention span & concentration ability appears normal.  Speech is fluent,  without dysarthria, dysphonia or aphasia.  Mood and affect are appropriate.  Cranial nerves: Pupils are equal and briskly reactive to light. Funduscopic exam without  evidence of pallor or edema. Extraocular movements  in vertical and horizontal planes intact and without nystagmus. Visual fields by finger perimetry are intact. Hearing to finger rub intact.   Facial sensation intact to fine touch.  Facial motor strength is symmetric and tongue and uvula move midline. Shoulder shrug was symmetrical.  Motor exam: Normal tone, muscle bulk and symmetric strength in all extremities. Sensory:  Fine touch, pinprick and vibration were tested in all extremities. Proprioception tested in the upper extremities was normal. Coordination: Rapid alternating movements in the fingers/hands was normal. Finger-to-nose maneuver  normal without evidence of ataxia, dysmetria or tremor.  Gait and station: Patient walks without assistive device and is able unassisted to climb up to the exam table. Strength within normal limits.  Stance is stable and normal.  Deep tendon reflexes: in the  upper and lower extremities are symmetric and intact. Babinski maneuver response is downgoing. The patient was advised of the nature of the diagnosed sleep disorder , the treatment options and risks for general a health and wellness arising from not treating the condition.  I spent more than 35 minutes of face to face time with the patient. Greater than 50% of time was spent in counseling and coordination of care. We have discussed the diagnosis and differential and I answered the patient's questions.    Assessment:  After physical and neurologic  examination, review of laboratory studies,  Personal review of imaging studies, reports of other /same  Imaging studies ,  Results of polysomnography/ neurophysiology testing and pre-existing records as far as provided in visit., my assessment is   1) Monica [name] suffers from migraine, associated with a visual aura and usually arising from the eye to the temple and forehead via the corona to the nape of the neck. Since she is pregnant right now traditional use of Imitrex and other triptan medications is not permitted.  Migraine can be treated with Butalbutal or Tylenol 3, both allowed during pregnancy there is no known negative side effect for a fetus.  There is no pallor of the optic disc, I do not think that she has pseudotumor cerebri. I am concerned that her thyroid disease which preceded the pregnancy may have a contributing effect on the migraine headache. I also did not have the endocrine laboratory results, but the patient gave me a verbal report. But the patient's pregnancy has progressed to the third trimester I would like for her to undergo an MRI if not urgent at the time and if her headache should go back to get better he may postpone it for after pregnancy. This is a with and without contrast MRI  Brain and she can pump and dump breastmilk for 1 day until the material has cleared her system.  2)  For now, use Fioricet  . MRI pending. Letter to employer.  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 I would like to urge her employer to reduce the screen glare with a special filter as the patient is bound to sit in front of her computer to work I also would recommend non-flickering illumination, LED and halogen lights work best. And the possibility to pull the screen in front of the natural daylight windows since sometimes daylight can be a bother to. I would like the patient to be able to take  medication at work, that is not sedating and not altering her level of consciousness. I do not expect her to be less productive when she takes medication that is successfully treating her headaches.     Plan:  Treatment plan and additional workup : see above  Dr Su Hilt.      Monica Mylar Teandre Hamre MD  09/22/2015   CC: Osborn Coho, Md [address] [place],  [place] [address]

## 2015-09-28 ENCOUNTER — Telehealth: Payer: Self-pay | Admitting: Neurology

## 2015-09-28 DIAGNOSIS — G43109 Migraine with aura, not intractable, without status migrainosus: Secondary | ICD-10-CM

## 2015-09-28 MED ORDER — BUTALBITAL-APAP-CAFFEINE 50-325-40 MG PO TABS: 1.0000 | ORAL_TABLET | Freq: Four times a day (QID) | ORAL | Status: DC | PRN

## 2015-09-28 NOTE — Telephone Encounter (Signed)
Refilled the caffeine form of butal butal. CD

## 2015-09-28 NOTE — Telephone Encounter (Signed)
I called pt to advise her that Dr. Brett Fairy changed her fioricet RX. No answer, left a message asking her to call me back.

## 2015-09-28 NOTE — Telephone Encounter (Signed)
Patient called to advise, she is pregnant and Dr. Brett Fairy prescribed butalbital-acetaminophen-caffeine (FIORICET WITH CODEINE) 50-325-40-30 MG capsule for migraines. States pharmacy won't fill this medication because it has codeine in it, states OB-GYN Dr. Mancel Bale says this medication has caffeine in it. Patient asks if Dr. Brett Fairy can fill the medication with caffeine and not the one with codeine.

## 2015-09-29 NOTE — Telephone Encounter (Signed)
RX for fioricet faxed to [facility].

## 2015-09-30 NOTE — Telephone Encounter (Signed)
I spoke to pt. She wants the fioricet with caffeine but not codeine. I advised pt that the RX for fioricet with caffeine was sent to her [facility] pharmacy. Pt verbalized understanding.

## 2015-10-28 ENCOUNTER — Telehealth: Payer: Self-pay | Admitting: Neurology

## 2015-10-28 ENCOUNTER — Ambulatory Visit (INDEPENDENT_AMBULATORY_CARE_PROVIDER_SITE_OTHER): Payer: BLUE CROSS/BLUE SHIELD | Admitting: Adult Health

## 2015-10-28 ENCOUNTER — Encounter: Payer: Self-pay | Admitting: Adult Health

## 2015-10-28 VITALS — BP 105/70 | HR 88 | Wt 208.6 lb

## 2015-10-28 DIAGNOSIS — G43019 Migraine without aura, intractable, without status migrainosus: Secondary | ICD-10-CM | POA: Diagnosis not present

## 2015-10-28 DIAGNOSIS — G43511 Persistent migraine aura without cerebral infarction, intractable, with status migrainosus: Secondary | ICD-10-CM

## 2015-10-28 NOTE — Progress Notes (Signed)
PATIENT: Monica [name] DOB: [birth year]  REASON FOR VISIT: follow up- migraine headaches HISTORY FROM: patient  HISTORY OF PRESENT ILLNESS: Monica [name] is a 35 year old female with a history of migraine headaches. She returns today for follow-up. The patient is in her third trimester of pregnancy. Her due date is in November. She reports had she continues to have a daily headache. She uses Fioricet 4-5 times a week. She states that it is beneficial. Her OB/GYN did not want her taking codeine. The patient states that she does have trouble with her thyroid. 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 Dr. Brett Fairy has suggested an MRI of the brain to be obtained during her third trimester. This was ordered at the last visit. The patient has not yet scheduled this. She returns today for an evaluation.  HISTORY 09/22/15 [facility]): Monica [name] is a 35 y.o. female , seen here as a referral from Dr. Mancel Bale for a headache evaluation,  Monica [name] reports that her headaches for which she sees me today begun about a year ago. At the time she decided to stop smoking and use nicotine patches. She also exercised regularly and she lost 15 pounds. Her headaches were present but a little less intense after she had lost weight. She begun taking over-the-counter nonsteroidals to control the headaches but soon realized that she was taking ibuprofen daily. And of January 2017 she saw her gynecologist for a Pap smear. And she noted during the exam that the thyroid was enlarged. She was seen by endocrinology soon after, she was found to have a goiter with nodules that were non-cancerous. Her endocrinologist found that her TSH was was normal but T3 and T4 were reduced. Her endocrinologist therefore found that a neurology evaluation is necessary to see if the thyroid dysfunction as cause of the headaches are not. The patient is currently [redacted] weeks pregnant- this is her  fourth pregnancy.  Monica [name] reports that her headaches are associated with visual distortion it is difficult for her to judge the disturbance parts of the picture may be in and out of focus she also has often a visual aura before the headaches that on in full force usually fortification lines, bright lights and firework sensations. Her headaches are radiating from behind the eye with and retro-orbital pressure sensation to the temple and for head and to the nape of the neck. When her headaches are radiating she will have "whole head hurting. She also has a lot of nausea with this. She feels tired and fatigued and sluggish even before she got pregnant and is concerned that this is from the thyroid is well. I was able to review the patient's initial labs, she is restless positive antibody screen was negative, hemoglobin was 11.6 there is a negative sexual disease screen. At this time Monica [name] feels exhausted and just bad. She would like to get rid of her headaches and she would like to be evaluated by neurology to see if also her fatigue and malaise are related to a neuroendocrine disorder.   Chief complaint according to patient : HEADACHES, migrainously, with aura and with status . Was taking Ibuprofen daily- recently changed to butalbutal with caffeine. Transformed migraine.   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 She goes to bed usually between 9 and 9:30  PM, her children go to bed earlier at 8:30. She does not have trouble falling asleep she wakes up frequently throughout the night often related to anxiety and sometimes due to the urge to urinate. She wakens at 6 AM regularly to get ready for work and gets her kids to school. She is a single living alone with her 3 children. Her daytime is busy and she has no time to nap. Social history:  Unmarried, expecting her 4 th child. She has discontinued caffeine was in the first couple of months of her pregnancy, she does know longer smoke, no alcohol use in pregnancy.  REVIEW OF SYSTEMS: Out of a complete 14 system review of symptoms, the patient complains only of the following symptoms, and all other reviewed systems are negative.  See history of present illness  ALLERGIES: Allergies  Allergen Reactions  . Latex Rash    HOME MEDICATIONS: Outpatient Prescriptions Prior to Visit  Medication Sig Dispense Refill  . Adapalene 0.3 % gel Apply topically at bedtime.    . busPIRone (BUSPAR) 10 MG tablet Take 10 mg by mouth 2 (two) times daily.    . butalbital-acetaminophen-caffeine (FIORICET, ESGIC) 50-325-40 MG tablet Take 1 tablet by mouth every 6 (six) hours as needed for headache. 15 tablet 3  . cyproheptadine (PERIACTIN) 4 MG tablet Take 1 tablet (4 mg total) by mouth 2 (two) times daily. 60 tablet 0  . MAGNESIUM PO Take 1 tablet by mouth daily.    . Prenatal Vit-Fe Fumarate-FA (PRENATAL MULTIVITAMIN) TABS tablet Take 1 tablet by mouth daily at 12 noon.    . butalbital-acetaminophen-caffeine (FIORICET WITH CODEINE) 50-325-40-30 MG capsule Take 1 capsule by mouth every 4 (four) hours as needed for headache. 30 capsule 1  . ondansetron (ZOFRAN-ODT) 4 MG disintegrating tablet Take 4 mg by mouth every 8 (eight) hours as needed for nausea or vomiting.     No facility-administered medications prior to visit.    PAST MEDICAL HISTORY: Past Medical History  Diagnosis Date  . Thyroid nodule   . Headache   . Obesity   . Migraine     PAST SURGICAL HISTORY: Past Surgical History  Procedure Laterality Date  . Cesarean section      FAMILY HISTORY: Family History  Problem Relation Age of Onset  . Hypertension Mother   . Diabetes Mother   . Arthritis-Osteo Mother   . Cancer Mother     uterine, stage 1  . Hypertension Brother     1 brother  . Sleep apnea Brother     1 brother   . Crohn's disease Brother     1 brother    SOCIAL HISTORY: Social History   Social History  . Marital Status: Divorced    Spouse Name: N/A  . Number of Children: N/A  . Years of Education: N/A   Occupational History  . Not on file.   Social History Main Topics  . Smoking status: Former Smoker    Quit date: 06/07/2015  . Smokeless tobacco: Not on file  . Alcohol Use: Yes     Comment: socially  . Drug Use: No  . Sexual Activity: Yes   Other Topics Concern  . Not on file   Social History Narrative      PHYSICAL EXAM  Filed Vitals:   10/28/15 0922  BP: 105/70  Pulse: 88  Weight: 208 lb 9.6 oz (94.62 kg)   Body mass index is 33.68 kg/(m^2).  Generalized: Well developed, in no acute distress  Neurological examination  Mentation: Alert oriented to time, place, history taking. Follows all commands speech and language fluent Cranial nerve II-XII: Pupils were equal round reactive to light. Extraocular movements were full, visual field were full on confrontational test. Facial sensation and strength were normal. Uvula tongue midline. Head turning and shoulder shrug  were normal and symmetric. Motor: The motor testing reveals 5 over 5 strength of all 4 extremities. Good symmetric motor tone is noted throughout.  Sensory: Sensory testing is intact to soft touch on all 4 extremities. No evidence of extinction is noted.  Coordination: Cerebellar testing reveals good finger-nose-finger and heel-to-shin bilaterally.  Gait and station: Gait is slightly wide-based due to pregnancy. Reflexes: Deep tendon reflexes are symmetric and normal bilaterally.   DIAGNOSTIC DATA (LABS, IMAGING, TESTING) - I reviewed patient records, labs, notes, testing and imaging myself where available.  Lab Results  Component Value Date   WBC 4.5 06/17/2015   HGB 10.9* 06/17/2015   HCT 33.3* 06/17/2015   MCV 91.5 06/17/2015   PLT 203 06/17/2015      ASSESSMENT AND PLAN 35 y.o. year old  female  has a past medical history of Thyroid nodule; Headache; Obesity; and Migraine. here with:  1. Migraine headaches 2. Pregnancy  Overall the patient is managing her headaches. She does not want to be on a daily medication. Advised that taking Fioricet continuously can cause rebound headaches. She verbalized understanding. She will schedule her MRI of the brain that Dr. Brett Fairy ordered. She will follow-up in 3 months with Dr. Mechele Claude, MSN, NP-C 10/28/2015, 11:39 AM [facility] [address], [place], [place] [address] [phone]

## 2015-10-28 NOTE — Patient Instructions (Signed)
Continue Fioricet Schedule MRI.

## 2015-10-28 NOTE — Progress Notes (Signed)
I agree with the assessment and plan as directed by NP .The patient is known to me .   Eila Runyan, MD  

## 2015-10-28 NOTE — Telephone Encounter (Signed)
Met with the patient in office today, tried to get her scheduled for an MRI at [facility] but they said they cannot do MRI w/wo because she is pregnant. They would like to know if we can put an order in for an MRI without contrast.  °

## 2015-10-28 NOTE — Addendum Note (Signed)
Addended by: Lester Niles A on: 10/28/2015 01:51 PM   Modules accepted: Orders

## 2015-10-28 NOTE — Telephone Encounter (Signed)
Yes, it is possible to do m MRI without contrast. Please

## 2015-10-29 ENCOUNTER — Encounter (HOSPITAL_COMMUNITY): Payer: Self-pay | Admitting: *Deleted

## 2015-10-29 ENCOUNTER — Inpatient Hospital Stay (HOSPITAL_COMMUNITY)
Admission: AD | Admit: 2015-10-29 | Discharge: 2015-10-29 | Disposition: A | Discharge status: Home / Self Care | Payer: BLUE CROSS/BLUE SHIELD | Source: Ambulatory Visit | Attending: Obstetrics and Gynecology | Admitting: Obstetrics and Gynecology

## 2015-10-29 DIAGNOSIS — O99211 Obesity complicating pregnancy, first trimester: Secondary | ICD-10-CM | POA: Insufficient documentation

## 2015-10-29 DIAGNOSIS — R51 Headache: Secondary | ICD-10-CM | POA: Insufficient documentation

## 2015-10-29 DIAGNOSIS — O99281 Endocrine, nutritional and metabolic diseases complicating pregnancy, first trimester: Secondary | ICD-10-CM | POA: Diagnosis not present

## 2015-10-29 DIAGNOSIS — O99351 Diseases of the nervous system complicating pregnancy, first trimester: Secondary | ICD-10-CM | POA: Insufficient documentation

## 2015-10-29 DIAGNOSIS — O9989 Other specified diseases and conditions complicating pregnancy, childbirth and the puerperium: Secondary | ICD-10-CM | POA: Diagnosis not present

## 2015-10-29 DIAGNOSIS — Z3A01 Less than 8 weeks gestation of pregnancy: Secondary | ICD-10-CM | POA: Diagnosis not present

## 2015-10-29 DIAGNOSIS — Z3490 Encounter for supervision of normal pregnancy, unspecified, unspecified trimester: Secondary | ICD-10-CM

## 2015-10-29 DIAGNOSIS — O26891 Other specified pregnancy related conditions, first trimester: Secondary | ICD-10-CM | POA: Insufficient documentation

## 2015-10-29 DIAGNOSIS — E041 Nontoxic single thyroid nodule: Secondary | ICD-10-CM | POA: Diagnosis not present

## 2015-10-29 DIAGNOSIS — M546 Pain in thoracic spine: Secondary | ICD-10-CM | POA: Diagnosis present

## 2015-10-29 DIAGNOSIS — Z87891 Personal history of nicotine dependence: Secondary | ICD-10-CM | POA: Diagnosis not present

## 2015-10-29 DIAGNOSIS — M6248 Contracture of muscle, other site: Secondary | ICD-10-CM | POA: Diagnosis not present

## 2015-10-29 DIAGNOSIS — M62838 Other muscle spasm: Secondary | ICD-10-CM | POA: Diagnosis not present

## 2015-10-29 LAB — URINALYSIS, ROUTINE W REFLEX MICROSCOPIC
Bilirubin Urine: NEGATIVE
GLUCOSE, UA: NEGATIVE mg/dL
HGB URINE DIPSTICK: NEGATIVE
KETONES UR: NEGATIVE mg/dL
LEUKOCYTES UA: NEGATIVE
Nitrite: NEGATIVE
PH: 6 (ref 5.0–8.0)
PROTEIN: NEGATIVE mg/dL
Specific Gravity, Urine: 1.01 (ref 1.005–1.030)

## 2015-10-29 MED ORDER — CYCLOBENZAPRINE HCL 5 MG PO TABS: ORAL_TABLET | ORAL | Status: DC

## 2015-10-29 MED ORDER — CYCLOBENZAPRINE HCL 5 MG PO TABS
5.0000 mg | ORAL_TABLET | Freq: Once | ORAL | Status: AC
  Administered 2015-10-29: 5 mg via ORAL
  Filled 2015-10-29: qty 1

## 2015-10-29 NOTE — MAU Provider Note (Signed)
History   CSN: [id number]  Arrival date and time: 10/29/15 1252   First Provider Initiated Contact with Patient 10/29/15 1327      Chief Complaint  Patient presents with  . Back Pain   HPI    35 y/o F G5P3 and 23 and [redacted] wks gestation who presents to the MAU for 3 days on worsening right shoulder and upper back pain. She reports it all started on Wed morning when she woke up. She has no recollection of an injury or lifting something heavier than she usually does. She reports that she is having difficulty sleeping because of the pain. It hurts when she rolls over on to it. She denies and neurologic problems including weakness or numbness and tingling of the hands and arms. She has been taking IBU with minimal relief of the pain. She has no contractions, vaginal bleeding or discharge. She reports that she is feeling regular fetal movment.   Past Medical History  Diagnosis Date  . Thyroid nodule   . Headache   . Obesity   . Migraine     Past Surgical History  Procedure Laterality Date  . Cesarean section      Family History  Problem Relation Age of Onset  . Hypertension Mother   . Diabetes Mother   . Arthritis-Osteo Mother   . Cancer Mother     uterine, stage 1  . Hypertension Brother     1 brother  . Sleep apnea Brother     1 brother  . Crohn's disease Brother     1 brother    Social History  Substance Use Topics  . Smoking status: Former Smoker    Quit date: 06/07/2015  . Smokeless tobacco: None  . Alcohol Use: No     Comment: socially    Allergies:  Allergies  Allergen Reactions  . Latex Rash    Prescriptions prior to admission  Medication Sig Dispense Refill Last Dose  . Adapalene 0.3 % gel Apply topically at bedtime.   Taking  . busPIRone (BUSPAR) 10 MG tablet Take 10 mg by mouth 2 (two) times daily.   Taking  . butalbital-acetaminophen-caffeine (FIORICET, ESGIC) 50-325-40 MG tablet Take 1 tablet by mouth every 6 (six) hours as needed for headache. 15  tablet 3 Taking  . cyproheptadine (PERIACTIN) 4 MG tablet Take 1 tablet (4 mg total) by mouth 2 (two) times daily. 60 tablet 0 Taking  . MAGNESIUM PO Take 1 tablet by mouth daily.   Taking  . Prenatal Vit-Fe Fumarate-FA (PRENATAL MULTIVITAMIN) TABS tablet Take 1 tablet by mouth daily at 12 noon.   Taking    Review of Systems  Constitutional: Negative for fever, chills and weight loss.  Eyes: Negative for blurred vision, double vision and photophobia.  Respiratory: Negative for cough, hemoptysis and sputum production.   Cardiovascular: Negative for chest pain, palpitations and orthopnea.  Gastrointestinal: Negative for heartburn, nausea, vomiting and abdominal pain.  Musculoskeletal: Positive for back pain and neck pain. Negative for myalgias.  Skin: Positive for itching and rash.  Neurological: Negative for dizziness, tingling, sensory change and focal weakness.  Psychiatric/Behavioral: Negative for depression and substance abuse.   Physical Exam   Blood pressure 115/69, pulse 87, temperature 98 F (36.7 C), temperature source Oral, resp. rate 16, last menstrual period 05/17/2015, SpO2 100 %.  Physical Exam  Constitutional: She appears well-developed and well-nourished.  HENT:  Head: Normocephalic and atraumatic.  Respiratory: Effort normal. No respiratory distress. She has no wheezes.  GI: There is no tenderness. There is no rebound and no guarding.  gravid  Musculoskeletal:       Right shoulder: She exhibits tenderness and spasm. She exhibits no bony tenderness, no swelling and no deformity.       Cervical back: She exhibits spasm.  Skin: No rash noted. No erythema.  Psychiatric: She has a normal mood and affect. Her behavior is normal. Thought content normal.   FHTS: 140 Toco: No contractions  MAU Course  Procedures  Assessment and Plan  Trapezius and subscapularis muscle spasm. -flexeril -tylenol -stretching  Pregnancy No labor or pregnancy related complaints,  routine care  Ok for D/C home in stable condition after d/w Dr. Mancel Bale.   [name] 10/29/2015, 1:30 PM 2

## 2015-10-29 NOTE — MAU Note (Signed)
Patient started having constant back pain on Wednesday.  She has taken ibuprofen which has brought the pain down from 10/10 to 6/10.  Denies LOF or vaginal bleeding.  Reports +fetal movement.

## 2015-10-29 NOTE — Discharge Instructions (Signed)
Muscle Cramps and Spasms Muscle cramps and spasms are when muscles tighten by themselves. They usually get better within minutes. Muscle cramps are painful. They are usually stronger and last longer than muscle spasms. Muscle spasms may or may not be painful. They can last a few seconds or much longer. HOME CARE  Drink enough fluid to keep your pee (urine) clear or pale yellow.  Massage, stretch, and relax the muscle.  Use a warm towel, heating pad, or warm shower water on tight muscles.  Place ice on the muscle if it is tender or in pain.  Put ice in a plastic bag.  Place a towel between your skin and the bag.  Leave the ice on for 15-20 minutes, 03-04 times a day.  Only take medicine as told by your doctor. GET HELP RIGHT AWAY IF:  Your cramps or spasms get worse, happen more often, or do not get better with time. MAKE SURE YOU:  Understand these instructions.  Will watch your condition.  Will get help right away if you are not doing well or get worse.   This information is not intended to replace advice given to you by your health care provider. Make sure you discuss any questions you have with your health care provider.   Document Released: 03/09/2008 Document Revised: 07/22/2012 Document Reviewed: 03/13/2012 Elsevier Interactive Patient Education 2016 [employer].  Depression (Active)    Start with erect posture. Lower shoulders. Hold 10 seconds. Repeat 10 times. Do 2-3 sessions per day.  Copyright  VHI. All rights reserved.  Flexibility: Upper Trapezius Stretch    Gently grasp right side of head while reaching behind back with other hand. Tilt head away until a gentle stretch is felt. Hold 10 seconds. Repeat 10 times per set. Do 5 sets per session. Do 3 sessions per day.  http://orth.exer.us/341   Copyright  VHI. All rights reserved.  Ear / Shoulder Stretch    Exhaling, move left ear toward left shoulder. Hold position for 5  breaths. Inhaling, bring  head back to center. Repeat to other side. Repeat sequence 10 times. Do 3 times per day.  Copyright  VHI. All rights reserved.

## 2015-11-08 ENCOUNTER — Ambulatory Visit
Admission: RE | Admit: 2015-11-08 | Discharge: 2015-11-08 | Disposition: A | Discharge status: Home / Self Care | Payer: BLUE CROSS/BLUE SHIELD | Source: Ambulatory Visit | Attending: Neurology | Admitting: Neurology

## 2015-11-08 DIAGNOSIS — G43511 Persistent migraine aura without cerebral infarction, intractable, with status migrainosus: Secondary | ICD-10-CM

## 2015-11-16 ENCOUNTER — Telehealth: Payer: Self-pay

## 2015-11-16 NOTE — Telephone Encounter (Signed)
-----   Message from Larey Seat, MD sent at 11/12/2015 11:06 AM EDT ----- Normal MRI brain, CD

## 2015-11-16 NOTE — Telephone Encounter (Signed)
I spoke to pt and advised her that her MRI brain was reviewed by Dr. Brett Fairy and it is normal. Pt verbalized understanding of results. Pt had no questions at this time but was encouraged to call back if questions arise.

## 2015-11-26 ENCOUNTER — Inpatient Hospital Stay (HOSPITAL_COMMUNITY)
Admission: AD | Admit: 2015-11-26 | Discharge: 2015-11-26 | Disposition: A | Discharge status: Home / Self Care | Payer: BLUE CROSS/BLUE SHIELD | Source: Ambulatory Visit | Attending: Obstetrics and Gynecology | Admitting: Obstetrics and Gynecology

## 2015-11-26 ENCOUNTER — Encounter (HOSPITAL_COMMUNITY): Payer: Self-pay | Admitting: *Deleted

## 2015-11-26 DIAGNOSIS — Z9889 Other specified postprocedural states: Secondary | ICD-10-CM | POA: Insufficient documentation

## 2015-11-26 DIAGNOSIS — Z3A27 27 weeks gestation of pregnancy: Secondary | ICD-10-CM | POA: Insufficient documentation

## 2015-11-26 DIAGNOSIS — N898 Other specified noninflammatory disorders of vagina: Secondary | ICD-10-CM | POA: Insufficient documentation

## 2015-11-26 DIAGNOSIS — Z87891 Personal history of nicotine dependence: Secondary | ICD-10-CM | POA: Diagnosis not present

## 2015-11-26 DIAGNOSIS — Z79899 Other long term (current) drug therapy: Secondary | ICD-10-CM | POA: Diagnosis not present

## 2015-11-26 DIAGNOSIS — O99352 Diseases of the nervous system complicating pregnancy, second trimester: Secondary | ICD-10-CM | POA: Diagnosis not present

## 2015-11-26 DIAGNOSIS — O26892 Other specified pregnancy related conditions, second trimester: Secondary | ICD-10-CM | POA: Diagnosis present

## 2015-11-26 DIAGNOSIS — R102 Pelvic and perineal pain: Secondary | ICD-10-CM | POA: Diagnosis present

## 2015-11-26 DIAGNOSIS — O26893 Other specified pregnancy related conditions, third trimester: Secondary | ICD-10-CM | POA: Diagnosis not present

## 2015-11-26 DIAGNOSIS — O99212 Obesity complicating pregnancy, second trimester: Secondary | ICD-10-CM | POA: Insufficient documentation

## 2015-11-26 DIAGNOSIS — Z888 Allergy status to other drugs, medicaments and biological substances status: Secondary | ICD-10-CM | POA: Diagnosis not present

## 2015-11-26 LAB — URINALYSIS, ROUTINE W REFLEX MICROSCOPIC
BILIRUBIN URINE: NEGATIVE
Glucose, UA: NEGATIVE mg/dL
HGB URINE DIPSTICK: NEGATIVE
Ketones, ur: NEGATIVE mg/dL
Leukocytes, UA: NEGATIVE
NITRITE: NEGATIVE
PH: 6 (ref 5.0–8.0)
Protein, ur: NEGATIVE mg/dL
SPECIFIC GRAVITY, URINE: 1.015 (ref 1.005–1.030)

## 2015-11-26 LAB — WET PREP, GENITAL
Clue Cells Wet Prep HPF POC: NONE SEEN
SPERM: NONE SEEN
Trich, Wet Prep: NONE SEEN
Yeast Wet Prep HPF POC: NONE SEEN

## 2015-11-26 NOTE — Discharge Instructions (Signed)

## 2015-11-26 NOTE — MAU Note (Addendum)
Pt C/O a lot of pelvic pressure since she woke up this morning, occasionally feels a "push" which is painful.  Pain has become worse throughout the morning, is worse with movement.  Also has clear/white thick discharge today.  Denies bleeding.

## 2015-11-26 NOTE — MAU Provider Note (Signed)
History     CSN: [id number]  Arrival date and time: 11/26/15 1013   First Provider Initiated Contact with Patient 11/26/15 1059      Chief Complaint  Patient presents with  . Pelvic Pain  . Vaginal Discharge   HPI  Monica [name] is a 35 year old G5 P3 at 27 weeks and 4 days who presents today for ongoing mucousy discharge. She reports that this morning when she was getting ready she noticed some significant discharge and pressure and pain in the vaginal area. She denies any vaginal bleeding. She reports regular fetal movement and no contractions. She denies any vaginal bleeding. Denies dysuria urgency. She reports no fevers or chills. She reports she had a last bowel movement yesterday and did not have to strain significantly.  OB History    Gravida Para Term Preterm AB Living   5 3     1 3    SAB TAB Ectopic Multiple Live Births                  Past Medical History:  Diagnosis Date  . Headache   . Migraine   . Obesity   . Thyroid nodule     Past Surgical History:  Procedure Laterality Date  . CESAREAN SECTION     C/S x 3  . WISDOM TOOTH EXTRACTION      Family History  Problem Relation Age of Onset  . Hypertension Mother   . Diabetes Mother   . Arthritis-Osteo Mother   . Cancer Mother     uterine, stage 1  . Hypertension Brother     1 brother  . Sleep apnea Brother     1 brother  . Crohn's disease Brother     1 brother    Social History  Substance Use Topics  . Smoking status: Former Smoker    Quit date: 06/07/2015  . Smokeless tobacco: Never Used  . Alcohol use No     Comment: socially    Allergies:  Allergies  Allergen Reactions  . Latex Rash    Prescriptions Prior to Admission  Medication Sig Dispense Refill Last Dose  . Adapalene 0.3 % gel Apply topically at bedtime.   11/25/2015 at Unknown time  . busPIRone (BUSPAR) 10 MG tablet Take 10 mg by mouth daily as needed (for anxiety).    Past Week at Unknown time  .  butalbital-acetaminophen-caffeine (FIORICET, ESGIC) 50-325-40 MG tablet Take 1 tablet by mouth every 6 (six) hours as needed for headache. 15 tablet 3 Past Week at Unknown time  . cyclobenzaprine (FLEXERIL) 5 MG tablet Take 1-2 tablets every 8 hours as need for pain. 30 tablet 0 Past Week at Unknown time  . cyproheptadine (PERIACTIN) 4 MG tablet Take 1 tablet (4 mg total) by mouth 2 (two) times daily. (Patient taking differently: Take 4 mg by mouth 2 (two) times daily as needed for allergies (headaches). ) 60 tablet 0 Past Month at Unknown time  . ibuprofen (ADVIL,MOTRIN) 200 MG tablet Take 400 mg by mouth daily as needed for headache.   Past Month at Unknown time    Review of Systems  Constitutional: Negative for chills and fever.  HENT: Negative for congestion.   Eyes: Negative for blurred vision and double vision.  Respiratory: Negative for cough and sputum production.   Cardiovascular: Negative for chest pain and palpitations.  Gastrointestinal: Positive for abdominal pain. Negative for heartburn, nausea and vomiting.  Genitourinary: Negative for dysuria, frequency and urgency.  Musculoskeletal: Negative for back  pain and myalgias.  Skin: Negative for itching and rash.  Neurological: Negative for dizziness, tingling and headaches.  Endo/Heme/Allergies: Negative for environmental allergies.   Physical Exam   Blood pressure 115/75, pulse 109, temperature 98 F (36.7 C), temperature source Oral, resp. rate 20, last menstrual period 05/17/2015.  Physical Exam  Constitutional: She is oriented to person, place, and time. She appears well-developed and well-nourished.  HENT:  Head: Normocephalic and atraumatic.  Cardiovascular: Normal rate and intact distal pulses.   Respiratory: Effort normal. No respiratory distress.  Genitourinary:  Genitourinary Comments: Significant white mucousy vaginal discharge. No cervical motion tenderness. No stool palpable in the rectum on bimanual exam. No  cervical motion tenderness. Cervix closed.  Neurological: She is alert and oriented to person, place, and time.  Skin: Skin is warm and dry.  Psychiatric: She has a normal mood and affect. Her behavior is normal.    MAU Course  Procedures  MDM In the MAU patient underwent pelvic examination which revealed physiologic discharge and no other concerning findings. Patient's cervix was closed. She had no significant pain. GC and wet prep were sent. Wet prep revealed many bacteria but no clue cells. Discussed with patient's findings and asked if she had any risk factors for STDs. Patient declined any risk factors that she's been with the same partner for over a year at this time and believes he has been painful. Discussed with Dr. Charlesetta Garibaldi who did not recommend any treatment until GC chlamydia is back.  Assessment and Plan  Vaginal discharge: Patient with vaginal discharge since likely physiologic and will progress with pregnancy. No other concerning findings. Urinalysis was negative. Patient discharged home with reassurance.  [name] 11/26/2015, 11:21 AM

## 2015-11-29 LAB — GC/CHLAMYDIA PROBE AMP (~~LOC~~) NOT AT ARMC
CHLAMYDIA, DNA PROBE: NEGATIVE
Neisseria Gonorrhea: NEGATIVE

## 2016-01-04 ENCOUNTER — Ambulatory Visit: Payer: BLUE CROSS/BLUE SHIELD | Attending: Family Medicine | Admitting: Physical Therapy

## 2016-01-04 DIAGNOSIS — M25551 Pain in right hip: Secondary | ICD-10-CM | POA: Insufficient documentation

## 2016-01-04 DIAGNOSIS — R262 Difficulty in walking, not elsewhere classified: Secondary | ICD-10-CM | POA: Insufficient documentation

## 2016-01-04 DIAGNOSIS — R293 Abnormal posture: Secondary | ICD-10-CM | POA: Diagnosis not present

## 2016-01-04 DIAGNOSIS — M545 Low back pain: Secondary | ICD-10-CM | POA: Diagnosis present

## 2016-01-04 DIAGNOSIS — M25552 Pain in left hip: Secondary | ICD-10-CM | POA: Insufficient documentation

## 2016-01-04 DIAGNOSIS — M6281 Muscle weakness (generalized): Secondary | ICD-10-CM | POA: Diagnosis present

## 2016-01-04 NOTE — Patient Instructions (Signed)
Back Pain in Pregnancy Back pain during pregnancy is common. It happens in about half of all pregnancies. It is important for you and your baby that you remain active during your pregnancy.If you feel that back pain is not allowing you to remain active or sleep well, it is time to see your caregiver. Back pain may be caused by several factors related to changes during your pregnancy.Fortunately, unless you had trouble with your back before your pregnancy, the pain is likely to get better after you deliver. Low back pain usually occurs between the fifth and seventh months of pregnancy. It can, however, happen in the first couple months. Factors that increase the risk of back problems include:   Previous back problems.  Injury to your back.  Having twins or multiple births.  A chronic cough.  Stress.  Job-related repetitive motions.  Muscle or spinal disease in the back.  Family history of back problems, ruptured (herniated) discs, or osteoporosis.  Depression, anxiety, and panic attacks. CAUSES   When you are pregnant, your body produces a hormone called relaxin. This hormonemakes the ligaments connecting the low back and pubic bones more flexible. This flexibility allows the baby to be delivered more easily. When your ligaments are loose, your muscles need to work harder to support your back. Soreness in your back can come from tired muscles. Soreness can also come from back tissues that are irritated since they are receiving less support.  As the baby grows, it puts pressure on the nerves and blood vessels in your pelvis. This can cause back pain.  As the baby grows and gets heavier during pregnancy, the uterus pushes the stomach muscles forward and changes your center of gravity. This makes your back muscles work harder to maintain good posture. SYMPTOMS  Lumbar pain during pregnancy Lumbar pain during pregnancy usually occurs at or above the waist in the center of the back. There  may be pain and numbness that radiates into your leg or foot. This is similar to low back pain experienced by non-pregnant women. It usually increases with sitting for long periods of time, standing, or repetitive lifting. Tenderness may also be present in the muscles along your upper back. Posterior pelvic pain during pregnancy Pain in the back of the pelvis is more common than lumbar pain in pregnancy. It is a deep pain felt in your side at the waistline, or across the tailbone (sacrum), or in both places. You may have pain on one or both sides. This pain can also go into the buttocks and backs of the upper thighs. Pubic and groin pain may also be present. The pain does not quickly resolve with rest, and morning stiffness may also be present. Pelvic pain during pregnancy can be brought on by most activities. A high level of fitness before and during pregnancy may or may not prevent this problem. Labor pain is usually 1 to 2 minutes apart, lasts for about 1 minute, and involves a bearing down feeling or pressure in your pelvis. However, if you are at term with the pregnancy, constant low back pain can be the beginning of early labor, and you should be aware of this. DIAGNOSIS  X-rays of the back should not be done during the first 12 to 14 weeks of the pregnancy and only when absolutely necessary during the rest of the pregnancy. MRIs do not give off radiation and are safe during pregnancy. MRIs also should only be done when absolutely necessary. HOME CARE INSTRUCTIONS  Exercise   as directed by your caregiver. Exercise is the most effective way to prevent or manage back pain. If you have a back problem, it is especially important to avoid sports that require sudden body movements. Swimming and walking are great activities.  Do not stand in one place for long periods of time.  Do not wear high heels.  Sit in chairs with good posture. Use a pillow on your lower back if necessary. Make sure your head  rests over your shoulders and is not hanging forward.  Try sleeping on your side, preferably the left side, with a pillow or two between your legs. If you are sore after a night's rest, your bedmay betoo soft.Try placing a board between your mattress and box spring.  Listen to your body when lifting.If you are experiencing pain, ask for help or try bending yourknees more so you can use your leg muscles rather than your back muscles. Squat down when picking up something from the floor. Do not bend over.  Eat a healthy diet. Try to gain weight within your caregiver's recommendations.  Use heat or cold packs 3 to 4 times a day for 15 minutes to help with the pain.  Only take over-the-counter or prescription medicines for pain, discomfort, or fever as directed by your caregiver. Sudden (acute) back pain  Use bed rest for only the most extreme, acute episodes of back pain. Prolonged bed rest over 48 hours will aggravate your condition.  Ice is very effective for acute conditions.  Put ice in a plastic bag.  Place a towel between your skin and the bag.  Leave the ice on for 10 to 20 minutes every 2 hours, or as needed.  Using heat packs for 30 minutes prior to activities is also helpful. Continued back pain See your caregiver if you have continued problems. Your caregiver can help or refer you for appropriate physical therapy. With conditioning, most back problems can be avoided. Sometimes, a more serious issue may be the cause of back pain. You should be seen right away if new problems seem to be developing. Your caregiver may recommend:  A maternity girdle.  An elastic sling.  A back brace.  A massage therapist or acupuncture. SEEK MEDICAL CARE IF:   You are not able to do most of your daily activities, even when taking the pain medicine you were given.  You need a referral to a physical therapist or chiropractor.  You want to try acupuncture. SEEK IMMEDIATE MEDICAL CARE  IF:  You develop numbness, tingling, weakness, or problems with the use of your arms or legs.  You develop severe back pain that is no longer relieved with medicines.  You have a sudden change in bowel or bladder control.  You have increasing pain in other areas of the body.  You develop shortness of breath, dizziness, or fainting.  You develop nausea, vomiting, or sweating.  You have back pain which is similar to labor pains.  You have back pain along with your water breaking or vaginal bleeding.  You have back pain or numbness that travels down your leg.  Your back pain developed after you fell.  You develop pain on one side of your back. You may have a kidney stone.  You see blood in your urine. You may have a bladder infection or kidney stone.  You have back pain with blisters. You may have shingles. Back pain is fairly common during pregnancy but should not be accepted as just part of   the process. Back pain should always be treated as soon as possible. This will make your pregnancy as pleasant as possible. Document Released: 07/05/2005 Document Revised: 06/19/2011 Document Reviewed: 08/16/2010 [facility] Patient Information 2015 [place], [place]. This information is not intended to replace advice given to you by your health care provider. Make sure you discuss any questions you have with your health care provider.  Angry Cat Stretch    Round your back looking towards knees.  Then let your belly sag and lift head and tail.   Repeat __10__ times per set. Do ___1-2_ sets per session. Do ___2_ sessions per day.  http://orth.exer.us/119   Copyright  VHI. All rights reserved.   BACK: Child's Pose (Sciatica)    Sit in knee-chest position and reach arms forward. Separate knees for comfort. KNEES WIDE FOR THE BABY.  Hold position for _3__ breaths. Repeat ___3-5  times. Do __2-3_ times per day.  Copyright  VHI. All rights reserved.    Abdominal Bracing With Pelvic Floor  (Hook-Lying)    With neutral spine, tighten pelvic floor and abdominals. Repeat _10__ times. Do _3-5__ times a day. Try doing this in different positions too.    Copyright  VHI. All rights reserved.
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
## 2016-01-06 ENCOUNTER — Inpatient Hospital Stay (HOSPITAL_COMMUNITY)
Admission: AD | Admit: 2016-01-06 | Discharge: 2016-01-06 | Disposition: A | Discharge status: Home / Self Care | Payer: BLUE CROSS/BLUE SHIELD | Source: Ambulatory Visit | Attending: Obstetrics and Gynecology | Admitting: Obstetrics and Gynecology

## 2016-01-06 ENCOUNTER — Encounter (HOSPITAL_COMMUNITY): Payer: Self-pay | Admitting: *Deleted

## 2016-01-06 DIAGNOSIS — Z3A34 34 weeks gestation of pregnancy: Secondary | ICD-10-CM

## 2016-01-06 DIAGNOSIS — Z87891 Personal history of nicotine dependence: Secondary | ICD-10-CM | POA: Insufficient documentation

## 2016-01-06 DIAGNOSIS — O4703 False labor before 37 completed weeks of gestation, third trimester: Secondary | ICD-10-CM

## 2016-01-06 DIAGNOSIS — Z3A33 33 weeks gestation of pregnancy: Secondary | ICD-10-CM | POA: Insufficient documentation

## 2016-01-06 LAB — URINALYSIS, ROUTINE W REFLEX MICROSCOPIC
Bilirubin Urine: NEGATIVE
Glucose, UA: NEGATIVE mg/dL
Hgb urine dipstick: NEGATIVE
Ketones, ur: NEGATIVE mg/dL
LEUKOCYTES UA: NEGATIVE
Nitrite: NEGATIVE
PROTEIN: NEGATIVE mg/dL
SPECIFIC GRAVITY, URINE: 1.01 (ref 1.005–1.030)
pH: 6 (ref 5.0–8.0)

## 2016-01-06 LAB — FETAL FIBRONECTIN: FETAL FIBRONECTIN: NEGATIVE

## 2016-01-06 MED ORDER — NIFEDIPINE 10 MG PO CAPS: 10.0000 mg | ORAL_CAPSULE | Freq: Four times a day (QID) | ORAL | 0 refills | Status: DC | PRN

## 2016-01-06 MED ORDER — NIFEDIPINE 10 MG PO CAPS
10.0000 mg | ORAL_CAPSULE | Freq: Once | ORAL | Status: AC
  Administered 2016-01-06: 10 mg via ORAL
  Filled 2016-01-06: qty 1

## 2016-01-06 NOTE — Discharge Instructions (Signed)
Pelvic Rest Pelvic rest is sometimes recommended for women when:   The placenta is partially or completely covering the opening of the cervix (placenta previa).  There is bleeding between the uterine wall and the amniotic sac in the first trimester (subchorionic hemorrhage).  The cervix begins to open without labor starting (incompetent cervix, cervical insufficiency).  The labor is too early (preterm labor). HOME CARE INSTRUCTIONS  Do not have sexual intercourse, stimulation, or an orgasm.  Do not use tampons, douche, or put anything in the vagina.  Do not lift anything over 10 pounds (4.5 kg).  Avoid strenuous activity or straining your pelvic muscles. SEEK MEDICAL CARE IF:  You have any vaginal bleeding during pregnancy. Treat this as a potential emergency.  You have cramping pain felt low in the stomach (stronger than menstrual cramps).  You notice vaginal discharge (watery, mucus, or bloody).  You have a low, dull backache.  There are regular contractions or uterine tightening. SEEK IMMEDIATE MEDICAL CARE IF: You have vaginal bleeding and have placenta previa.    This information is not intended to replace advice given to you by your health care provider. Make sure you discuss any questions you have with your health care provider.   Document Released: 07/22/2010 Document Revised: 06/19/2011 Document Reviewed: 09/28/2014 Elsevier Interactive Patient Education 2016 [employer].  Preterm Labor Information Preterm labor is when labor starts at less than 37 weeks of pregnancy. The normal length of a pregnancy is 39 to 41 weeks. CAUSES Often, there is no identifiable underlying cause as to why a woman goes into preterm labor. One of the most common known causes of preterm labor is infection. Infections of the uterus, cervix, vagina, amniotic sac, bladder, kidney, or even the lungs (pneumonia) can cause labor to start. Other suspected causes of preterm labor include:    Urogenital infections, such as yeast infections and bacterial vaginosis.   Uterine abnormalities (uterine shape, uterine septum, fibroids, or bleeding from the placenta).   A cervix that has been operated on (it may fail to stay closed).   Malformations in the fetus.   Multiple gestations (twins, triplets, and so on).   Breakage of the amniotic sac.  RISK FACTORS  Having a previous history of preterm labor.   Having premature rupture of membranes (PROM).   Having a placenta that covers the opening of the cervix (placenta previa).   Having a placenta that separates from the uterus (placental abruption).   Having a cervix that is too weak to hold the fetus in the uterus (incompetent cervix).   Having too much fluid in the amniotic sac (polyhydramnios).   Taking illegal drugs or smoking while pregnant.   Not gaining enough weight while pregnant.   Being younger than 30 and older than 34 years old.   Having a low socioeconomic status.   Being African American. SYMPTOMS Signs and symptoms of preterm labor include:   Menstrual-like cramps, abdominal pain, or back pain.  Uterine contractions that are regular, as frequent as six in an hour, regardless of their intensity (may be mild or painful).  Contractions that start on the top of the uterus and spread down to the lower abdomen and back.   A sense of increased pelvic pressure.   A watery or bloody mucus discharge that comes from the vagina.  TREATMENT Depending on the length of the pregnancy and other circumstances, your health care provider may suggest bed rest. If necessary, there are medicines that can be given to stop  contractions and to mature the fetal lungs. If labor happens before 34 weeks of pregnancy, a prolonged hospital stay may be recommended. Treatment depends on the condition of both you and the fetus.  WHAT SHOULD YOU DO IF YOU THINK YOU ARE IN PRETERM LABOR? Call your health care  provider right away. You will need to go to the hospital to get checked immediately. HOW CAN YOU PREVENT PRETERM LABOR IN FUTURE PREGNANCIES? You should:   Stop smoking if you smoke.  Maintain healthy weight gain and avoid chemicals and drugs that are not necessary.  Be watchful for any type of infection.  Inform your health care provider if you have a known history of preterm labor.   This information is not intended to replace advice given to you by your health care provider. Make sure you discuss any questions you have with your health care provider.   Document Released: 06/17/2003 Document Revised: 11/27/2012 Document Reviewed: 04/29/2012 Elsevier Interactive Patient Education [employer].

## 2016-01-06 NOTE — MAU Note (Signed)
Urine sent to lab 

## 2016-01-06 NOTE — MAU Provider Note (Signed)
Chief Complaint:  Contractions   First Provider Initiated Contact with Patient 01/06/16 1407     HPI: Monica [name] is a 35 y.o. [id number] at 49w3dwho presents to maternity admissions reporting contractions. States they started out this morning then got more frequent and strong as the day went on.. She reports good fetal movement, denies LOF, vaginal bleeding, vaginal itching/burning, urinary symptoms, h/a, dizziness, n/v, diarrhea, constipation or fever/chills.  She denies headache, visual changes or RUQ abdominal pain.  Wants to be written out of work due to discomfort with contractions.  I told her she needs to discuss with her primary Dr Raphael Gibney.  Abdominal Pain  This is a new problem. The current episode started today. The onset quality is gradual. The problem occurs intermittently. The problem has been unchanged. The pain is located in the suprapubic region, LLQ and RLQ. The pain is moderate. The quality of the pain is cramping. The abdominal pain radiates to the back. Pertinent negatives include no anorexia, constipation, diarrhea, dysuria, fever, frequency, nausea or vomiting. Nothing aggravates the pain. The pain is relieved by nothing. She has tried nothing for the symptoms.   RN note: Pt started having uc's this morning 30 min apart, they become more frequent & stronger with walking.  Pt went to store over lunch & uc's became very strong.  Denies bleeding or LOF  Past Medical History: Past Medical History:  Diagnosis Date  . Headache   . Migraine   . Obesity   . Thyroid nodule     Past obstetric history: OB History  Gravida Para Term Preterm AB Living  5 3     1 3   SAB TAB Ectopic Multiple Live Births               # Outcome Date GA Lbr Len/2nd Weight Sex Delivery Anes PTL Lv  5 Current           4 AB           3 Para      CS-LTranv     2 Para      CS-LTranv     1 Para      CS-LTranv         Past Surgical History: Past Surgical History:  Procedure Laterality  Date  . CESAREAN SECTION     C/S x 3  . WISDOM TOOTH EXTRACTION      Family History: Family History  Problem Relation Age of Onset  . Hypertension Mother   . Diabetes Mother   . Arthritis-Osteo Mother   . Cancer Mother     uterine, stage 1  . Hypertension Brother     1 brother  . Sleep apnea Brother     1 brother  . Crohn's disease Brother     1 brother    Social History: Social History  Substance Use Topics  . Smoking status: Former Smoker    Quit date: 06/07/2015  . Smokeless tobacco: Never Used  . Alcohol use No     Comment: socially    Allergies:  Allergies  Allergen Reactions  . Latex Rash    Meds:  Prescriptions Prior to Admission  Medication Sig Dispense Refill Last Dose  . Adapalene 0.3 % gel Apply topically at bedtime.   Past Week at Unknown time  . busPIRone (BUSPAR) 10 MG tablet Take 10 mg by mouth daily as needed (for anxiety).    01/06/2016 at Unknown time  . butalbital-acetaminophen-caffeine (FIORICET, ESGIC) 50-325-40 MG tablet Take  1 tablet by mouth every 6 (six) hours as needed for headache. 15 tablet 3 Past Month at Unknown time  . cyproheptadine (PERIACTIN) 4 MG tablet Take 1 tablet (4 mg total) by mouth 2 (two) times daily. (Patient taking differently: Take 4 mg by mouth 2 (two) times daily as needed for allergies (headaches). ) 60 tablet 0 01/05/2016 at Unknown time  . Prenatal Vit-Fe Fumarate-FA (MULTIVITAMIN-PRENATAL) 27-0.8 MG TABS tablet Take 1 tablet by mouth daily at 12 noon.   01/06/2016 at Unknown time  . cyclobenzaprine (FLEXERIL) 5 MG tablet Take 1-2 tablets every 8 hours as need for pain. (Patient not taking: Reported on 01/06/2016) 30 tablet 0 Not Taking at Unknown time  . ibuprofen (ADVIL,MOTRIN) 200 MG tablet Take 400 mg by mouth daily as needed for headache.   Not Taking at Unknown time    I have reviewed patient's Past Medical Hx, Surgical Hx, Family Hx, Social Hx, medications and allergies.   ROS:  Review of Systems   Constitutional: Negative for chills and fever.  Gastrointestinal: Positive for abdominal pain. Negative for anorexia, constipation, diarrhea, nausea and vomiting.  Genitourinary: Positive for pelvic pain. Negative for dysuria, frequency, vaginal bleeding and vaginal discharge.  Neurological: Negative for dizziness.   Other systems negative  Physical Exam  Patient Vitals for the past 24 hrs:  BP Temp Temp src Pulse Resp  01/06/16 1454 115/79 - - - -  01/06/16 1406 122/78 97.5 F (36.4 C) Oral (!) 128 18   Constitutional: Well-developed, well-nourished female in no acute distress.  Cardiovascular: normal rate and rhythm Respiratory: normal effort, clear to auscultation bilaterally GI: Abd soft, non-tender, gravid appropriate for gestational age.   No rebound or guarding. MS: Extremities nontender, no edema, normal ROM Neurologic: Alert and oriented x 4.  GU: Neg CVAT.  Dilation: Closed Effacement (%): 50 Exam by:: M.Leander Tout,CNM  FHT:  Baseline 130 , moderate variability, accelerations present, no decelerations Contractions: q 2-4 mins Irregular    Labs: Results for orders placed or performed during the hospital encounter of 01/06/16 (from the past 24 hour(s))  Urinalysis, Routine w reflex microscopic (not at [facility])     Status: None   Collection Time: 01/06/16  1:48 PM  Result Value Ref Range   Color, Urine YELLOW YELLOW   APPearance CLEAR CLEAR   Specific Gravity, Urine 1.010 1.005 - 1.030   pH 6.0 5.0 - 8.0   Glucose, UA NEGATIVE NEGATIVE mg/dL   Hgb urine dipstick NEGATIVE NEGATIVE   Bilirubin Urine NEGATIVE NEGATIVE   Ketones, ur NEGATIVE NEGATIVE mg/dL   Protein, ur NEGATIVE NEGATIVE mg/dL   Nitrite NEGATIVE NEGATIVE   Leukocytes, UA NEGATIVE NEGATIVE  Fetal fibronectin     Status: None   Collection Time: 01/06/16  2:15 PM  Result Value Ref Range   Fetal Fibronectin NEGATIVE NEGATIVE      Imaging:  No results found.  MAU Course/MDM: I have ordered labs and  reviewed results. Fetal fibronectin collected NST reviewed Consult Dr Cletis Media with presentation, exam findings and test results.  Treatments in MAU included Procardia x 1 dose.  UCs lessened and are milder. Fetal fibronectin is NEGATIVE No change in cervix.    Assessment: [facility] at [redacted]w[redacted]d Preterm contractions with no change in cervix and negative Fetal Fibronectin  Plan: Discharge home Preterm Labor precautions and fetal kick counts Follow up in Office for prenatal visits and recheck of cervix  Encouraged to return here or to other Urgent Care/ED if she develops worsening of symptoms,  increase in pain, fever, or other concerning symptoms.      Pt stable at time of discharge.  Hansel Feinstein CNM, MSN Certified Nurse-Midwife 01/06/2016 3:05 PM

## 2016-01-06 NOTE — MAU Note (Signed)
Pt started having uc's this morning 30 min apart, they become more frequent & stronger with walking.  Pt went to store over lunch & uc's became very strong.  Denies bleeding or LOF.

## 2016-01-07 ENCOUNTER — Other Ambulatory Visit: Payer: Self-pay | Admitting: Obstetrics and Gynecology

## 2016-01-09 ENCOUNTER — Inpatient Hospital Stay (HOSPITAL_COMMUNITY)
Admission: AD | Admit: 2016-01-09 | Discharge: 2016-01-09 | Disposition: A | Discharge status: Home / Self Care | Payer: BLUE CROSS/BLUE SHIELD | Source: Ambulatory Visit | Attending: Obstetrics and Gynecology | Admitting: Obstetrics and Gynecology

## 2016-01-09 ENCOUNTER — Encounter (HOSPITAL_COMMUNITY): Payer: Self-pay

## 2016-01-09 DIAGNOSIS — O4703 False labor before 37 completed weeks of gestation, third trimester: Secondary | ICD-10-CM

## 2016-01-09 DIAGNOSIS — Z3A33 33 weeks gestation of pregnancy: Secondary | ICD-10-CM | POA: Diagnosis not present

## 2016-01-09 DIAGNOSIS — Z3A34 34 weeks gestation of pregnancy: Secondary | ICD-10-CM | POA: Diagnosis not present

## 2016-01-09 DIAGNOSIS — Z87891 Personal history of nicotine dependence: Secondary | ICD-10-CM | POA: Insufficient documentation

## 2016-01-09 LAB — URINALYSIS, ROUTINE W REFLEX MICROSCOPIC
BILIRUBIN URINE: NEGATIVE
Glucose, UA: NEGATIVE mg/dL
HGB URINE DIPSTICK: NEGATIVE
Ketones, ur: NEGATIVE mg/dL
Leukocytes, UA: NEGATIVE
Nitrite: NEGATIVE
PH: 6 (ref 5.0–8.0)
Protein, ur: NEGATIVE mg/dL
SPECIFIC GRAVITY, URINE: 1.025 (ref 1.005–1.030)

## 2016-01-09 NOTE — MAU Provider Note (Signed)
History   [id number]   Chief Complaint  Patient presents with  . Contractions    HPI Monica [name] is a 35 y.o. female  [id number] at [redacted]w[redacted]d IUP here with report of feeling increased contractions tonight.  Previously seen for similar condition and given Procardia 10 mg prn for use at home.  Reports feeling contractions every 5-10 minutes at home, took procardia and contractions have resolved.  Increased pressure with contractions.  Denies vaginal bleeding or leaking of fluid.  +fetal movement.    Patient's last menstrual period was 05/17/2015.  OB History  Gravida Para Term Preterm AB Living  5 3     1 3   SAB TAB Ectopic Multiple Live Births               # Outcome Date GA Lbr Len/2nd Weight Sex Delivery Anes PTL Lv  5 Current           4 AB           3 Para      CS-LTranv     2 Para      CS-LTranv     1 Para      CS-LTranv         Past Medical History:  Diagnosis Date  . Headache   . Migraine   . Obesity   . Thyroid nodule     Family History  Problem Relation Age of Onset  . Hypertension Mother   . Diabetes Mother   . Arthritis-Osteo Mother   . Cancer Mother     uterine, stage 1  . Hypertension Brother     1 brother  . Sleep apnea Brother     1 brother  . Crohn's disease Brother     1 brother    Social History   Social History  . Marital status: Divorced    Spouse name: N/A  . Number of children: N/A  . Years of education: N/A   Social History Main Topics  . Smoking status: Former Smoker    Quit date: 06/07/2015  . Smokeless tobacco: Never Used  . Alcohol use No     Comment: socially  . Drug use: No  . Sexual activity: Yes   Other Topics Concern  . None   Social History Narrative  . None    Allergies  Allergen Reactions  . Latex Rash    No current facility-administered medications on file prior to encounter.    Current Outpatient Prescriptions on File Prior to Encounter  Medication Sig Dispense Refill  . Adapalene 0.3 % gel Apply  topically at bedtime.    . busPIRone (BUSPAR) 10 MG tablet Take 10 mg by mouth daily as needed (for anxiety).     . butalbital-acetaminophen-caffeine (FIORICET, ESGIC) 50-325-40 MG tablet Take 1 tablet by mouth every 6 (six) hours as needed for headache. 15 tablet 3  . cyproheptadine (PERIACTIN) 4 MG tablet Take 1 tablet (4 mg total) by mouth 2 (two) times daily. (Patient taking differently: Take 4 mg by mouth 2 (two) times daily as needed for allergies (headaches). ) 60 tablet 0  . ibuprofen (ADVIL,MOTRIN) 200 MG tablet Take 400 mg by mouth daily as needed for headache.    Marland Kitchen NIFEdipine (PROCARDIA) 10 MG capsule Take 1 capsule (10 mg total) by mouth every 6 (six) hours as needed. 30 capsule 0  . Prenatal Vit-Fe Fumarate-FA (MULTIVITAMIN-PRENATAL) 27-0.8 MG TABS tablet Take 1 tablet by mouth daily at 12 noon.  Review of Systems  Gastrointestinal: Negative for constipation.  Genitourinary: Positive for pelvic pain (contractions). Negative for vaginal bleeding and vaginal discharge.  All other systems reviewed and are negative.    Physical Exam   Vitals:   01/09/16 2122 01/09/16 2129 01/09/16 2130  BP:   113/78  Pulse:   103  Resp:  18   Temp:  98.3 F (36.8 C)   Weight: 213 lb (96.6 kg)    Height: 6' (1.829 m)      Physical Exam  Constitutional: She is oriented to person, place, and time. She appears well-developed and well-nourished.  HENT:  Head: Normocephalic.  Neck: Normal range of motion. Neck supple.  Cardiovascular: Normal rate, regular rhythm and normal heart sounds.   Respiratory: Effort normal and breath sounds normal. No respiratory distress.  GI: Soft. There is no tenderness.  Genitourinary: No bleeding in the vagina. Vaginal discharge (mucusy) found.  Musculoskeletal: Normal range of motion. She exhibits no edema.  Neurological: She is alert and oriented to person, place, and time.  Skin: Skin is warm and dry.     Dilation: Fingertip Effacement (%):  70 Cervical Position: Posterior Exam by:: Reina Fuse, CNM  FHR 132, +accels Toco - none  MAU Course  Procedures  MDM Results for orders placed or performed during the hospital encounter of 01/09/16 (from the past 24 hour(s))  Urinalysis, Routine w reflex microscopic (not at [facility])     Status: None   Collection Time: 01/09/16  9:17 PM  Result Value Ref Range   Color, Urine YELLOW YELLOW   APPearance CLEAR CLEAR   Specific Gravity, Urine 1.025 1.005 - 1.030   pH 6.0 5.0 - 8.0   Glucose, UA NEGATIVE NEGATIVE mg/dL   Hgb urine dipstick NEGATIVE NEGATIVE   Bilirubin Urine NEGATIVE NEGATIVE   Ketones, ur NEGATIVE NEGATIVE mg/dL   Protein, ur NEGATIVE NEGATIVE mg/dL   Nitrite NEGATIVE NEGATIVE   Leukocytes, UA NEGATIVE NEGATIVE     Assessment and Plan  35 y.o. [id number] at [redacted]w[redacted]d IUP  Reactive SNT Preterm Contractions - Resolved  Plan: Discharge home Keep scheduled appointment Reviewed preterm labor precautions  Gwen Pounds, CNM 01/09/2016 9:51 PM

## 2016-01-09 NOTE — MAU Note (Addendum)
Pt went to physical therapy on Tuesday. Ctx have been getting worse since. Was at the office on Wednesday. Came here Thursday because it was getting worse. Last procardia taken at 1900.  She states it usually only lasts about 2-3 hours before you feel the contractions getting worse again.

## 2016-01-09 NOTE — Discharge Instructions (Signed)
Braxton Hicks Contractions °Contractions of the uterus can occur throughout pregnancy. Contractions are not always a sign that you are in labor.  °WHAT ARE BRAXTON HICKS CONTRACTIONS?  °Contractions that occur before labor are called Braxton Hicks contractions, or false labor. Toward the end of pregnancy (32-34 weeks), these contractions can develop more often and may become more forceful. This is not true labor because these contractions do not result in opening (dilatation) and thinning of the cervix. They are sometimes difficult to tell apart from true labor because these contractions can be forceful and people have different pain tolerances. You should not feel embarrassed if you go to the hospital with false labor. Sometimes, the only way to tell if you are in true labor is for your health care provider to look for changes in the cervix. °If there are no prenatal problems or other health problems associated with the pregnancy, it is completely safe to be sent home with false labor and await the onset of true labor. °HOW CAN YOU TELL THE DIFFERENCE BETWEEN TRUE AND FALSE LABOR? °False Labor °· The contractions of false labor are usually shorter and not as hard as those of true labor.   °· The contractions are usually irregular.   °· The contractions are often felt in the front of the lower abdomen and in the groin.   °· The contractions may go away when you walk around or change positions while lying down.   °· The contractions get weaker and are shorter lasting as time goes on.   °· The contractions do not usually become progressively stronger, regular, and closer together as with true labor.   °True Labor °· Contractions in true labor last 30-70 seconds, become very regular, usually become more intense, and increase in frequency.   °· The contractions do not go away with walking.   °· The discomfort is usually felt in the top of the uterus and spreads to the lower abdomen and low back.   °· True labor can be  determined by your health care provider with an exam. This will show that the cervix is dilating and getting thinner.   °WHAT TO REMEMBER °· Keep up with your usual exercises and follow other instructions given by your health care provider.   °· Take medicines as directed by your health care provider.   °· Keep your regular prenatal appointments.   °· Eat and drink lightly if you think you are going into labor.   °· If Braxton Hicks contractions are making you uncomfortable:   °¨ Change your position from lying down or resting to walking, or from walking to resting.   °¨ Sit and rest in a tub of warm water.   °¨ Drink 2-3 glasses of water. Dehydration may cause these contractions.   °¨ Do slow and deep breathing several times an hour.   °WHEN SHOULD I SEEK IMMEDIATE MEDICAL CARE? °Seek immediate medical care if: °· Your contractions become stronger, more regular, and closer together.   °· You have fluid leaking or gushing from your vagina.   °· You have a fever.   °· You pass blood-tinged mucus.   °· You have vaginal bleeding.   °· You have continuous abdominal pain.   °· You have low back pain that you never had before.   °· You feel your baby's head pushing down and causing pelvic pressure.   °· Your baby is not moving as much as it used to.   °  °This information is not intended to replace advice given to you by your health care provider. Make sure you discuss any questions you have with your health care   provider. °  °Document Released: 03/27/2005 Document Revised: 04/01/2013 Document Reviewed: 01/06/2013 °[employer] Interactive Patient Education ©2016 [employer] ° °

## 2016-01-10 ENCOUNTER — Encounter (HOSPITAL_COMMUNITY): Payer: Self-pay | Admitting: Emergency Medicine

## 2016-01-10 ENCOUNTER — Encounter (HOSPITAL_COMMUNITY): Payer: Self-pay

## 2016-01-11 ENCOUNTER — Ambulatory Visit: Payer: BLUE CROSS/BLUE SHIELD | Admitting: Physical Therapy

## 2016-01-12 ENCOUNTER — Encounter: Payer: Self-pay | Admitting: Adult Health

## 2016-01-12 ENCOUNTER — Ambulatory Visit (INDEPENDENT_AMBULATORY_CARE_PROVIDER_SITE_OTHER): Payer: BLUE CROSS/BLUE SHIELD | Admitting: Adult Health

## 2016-01-12 VITALS — BP 114/76 | HR 120 | Ht 66.5 in | Wt 212.8 lb

## 2016-01-12 DIAGNOSIS — Z3493 Encounter for supervision of normal pregnancy, unspecified, third trimester: Secondary | ICD-10-CM

## 2016-01-12 DIAGNOSIS — G43019 Migraine without aura, intractable, without status migrainosus: Secondary | ICD-10-CM | POA: Diagnosis not present

## 2016-01-12 NOTE — Progress Notes (Signed)
PATIENT: Monica [name] DOB: [birth year]  REASON FOR VISIT: follow up- migraines, pregnancy HISTORY FROM: patient  HISTORY OF PRESENT ILLNESS: Monica [name] is a 35 year old female with a history of migraine headaches. She is in her third trimester of pregnancy. She has a scheduled cesarean section for November. She is having migraines throughout her pregnancy. She's been using Fioricet 4-5 times a week. In the past she has not been any daily medications. Her OB/GYN did not want her on codeine either. She reports that her headaches have gotten worse. She states that she's been having preterm contractions and her headache severity seems to coincide with the contractions. She states that she's 100% effaced and dilated however she was given Procardia to prolong delivery. She states that she cannot tolerate taking Procardia and Fioricet at the same time. She states her headaches are a 7 out of 10. Her headaches are located across the forehead. She does have photophobia. With severe headache she may have nausea or vomiting. She returns today for an evaluation.  HISTORY 10/28/15: Monica [name] is a 35 year old female with a history of migraine headaches. She returns today for follow-up. The patient is in her third trimester of pregnancy. Her due date is in November. She reports had she continues to have a daily headache. She uses Fioricet 4-5 times a week. She states that it is beneficial. Her OB/GYN did not want her taking codeine. The patient states that she does have trouble with her thyroid. 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 Dr. Brett Fairy has suggested an MRI of the brain to be obtained during her third trimester. This was ordered at the last visit. The patient has not yet scheduled this. She returns today for an evaluation.  HISTORY 09/22/15 [facility] Monica [name] is a 35 y.o. female , seen here as a referral from Dr. Mancel Bale for a headache  evaluation,  Monica [name] reports that her headaches for which she sees me today begun about a year ago. At the time she decided to stop smoking and use nicotine patches. She also exercised regularly and she lost 15 pounds. Her headaches were present but a little less intense after she had lost weight. She begun taking over-the-counter nonsteroidals to control the headaches but soon realized that she was taking ibuprofen daily. And of January 2017 she saw her gynecologist for a Pap smear. And she noted during the exam that the thyroid was enlarged. She was seen by endocrinology soon after, she was found to have a goiter with nodules that were non-cancerous. Her endocrinologist found that her TSH was was normal but T3 and T4 were reduced. Her endocrinologist therefore found that a neurology evaluation is necessary to see if the thyroid dysfunction as cause of the headaches are not. The patient is currently [redacted] weeks pregnant- this is her fourth pregnancy.  Monica [name] reports that her headaches are associated with visual distortion it is difficult for her to judge the disturbance parts of the picture may be in and out of focus she also has often a visual aura before the headaches that on in full force usually fortification lines, bright lights and firework sensations. Her headaches are radiating from behind the eye with and retro-orbital pressure sensation to the temple and for head and to the nape of the neck. When her headaches are radiating she will have "whole head hurting. She also has a lot of nausea with this. She feels tired and fatigued and sluggish even  before she got pregnant and is concerned that this is from the thyroid is well. I was able to review the patient's initial labs, she is restless positive antibody screen was negative, hemoglobin was 11.6 there is a negative sexual disease screen. At this time Monica [name] feels exhausted and just bad. She would like to get rid of her headaches  and she would like to be evaluated by neurology to see if also her fatigue and malaise are related to a neuroendocrine disorder.   Chief complaint according to patient : HEADACHES, migrainously, with aura and with status . Was taking Ibuprofen daily- recently changed to butalbutal with caffeine. Transformed migraine.   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 She goes to bed usually between 9 and 9:30 PM, her children go to bed earlier at 8:30. She does not have trouble falling asleep she wakes up frequently throughout the night often related to anxiety and sometimes due to the urge to urinate. She wakens at 6 AM regularly to get ready for work and gets her kids to school. She is a single living alone with her 3 children. Her daytime is busy and she has no time to nap. Social history: Unmarried, expecting her 4 th child. She has discontinued caffeine was in the first couple of months of her pregnancy, she does know longer smoke, no alcohol use in pregnancy.  REVIEW OF SYSTEMS: Out of a complete 14 system review of symptoms, the patient complains only of the following symptoms, and all other reviewed systems are negative.  Memory loss, dizziness, headache, numbness, agitation, nervous/anxious, double vision, loss of vision, blurred vision, cold intolerance  ALLERGIES: Allergies  Allergen Reactions  . Latex Itching  . Latex Rash    HOME MEDICATIONS: Outpatient Medications Prior to Visit  Medication Sig Dispense Refill  . Adapalene 0.3 % gel Apply topically at bedtime.    . busPIRone (BUSPAR) 10 MG tablet Take 10 mg by mouth daily as needed (for anxiety).     . cyproheptadine (PERIACTIN) 4 MG tablet Take 1 tablet (4 mg total) by mouth 2 (two) times daily. (Patient taking differently: Take 4 mg by mouth 2 (two) times daily as needed for  allergies (headaches). ) 60 tablet 0  . gabapentin (NEURONTIN) 400 MG capsule Take 1 capsule (400 mg total) by mouth 3 (three) times daily as needed (anxiety). For anxiety 90 capsule 1  . ibuprofen (ADVIL,MOTRIN) 200 MG tablet Take 2 tablets (400 mg total) by mouth every 8 (eight) hours as needed. For mild/moderate pain. 30 tablet   . NIFEdipine (PROCARDIA) 10 MG capsule Take 1 capsule (10 mg total) by mouth every 6 (six) hours as needed. 30 capsule 0  . ondansetron (ZOFRAN) 4 MG tablet Take 1 tablet (4 mg total) by mouth every 6 (six) hours. 12 tablet 0  . Prenatal Vit-Fe Fumarate-FA (MULTIVITAMIN-PRENATAL) 27-0.8 MG TABS tablet Take 1 tablet by mouth daily at 12 noon.     No facility-administered medications prior to visit.     PAST MEDICAL HISTORY: Past Medical History:  Diagnosis Date  . Anemia   . Anxiety   . Bacterial vaginosis   . Bipolar affective disorder ([place])   . Depression   . Genital warts 2002  . H/O varicella   . Headache   . History of HPV infection   .  History of pyelonephritis   . Hyperemesis arising during pregnancy 2010  . Hypertension   . Migraine   . Mild dysplasia of cervix (CIN I)   . Obesity   . PTSD (post-traumatic stress disorder)   . Thyroid nodule     PAST SURGICAL HISTORY: Past Surgical History:  Procedure Laterality Date  . CESAREAN SECTION  2007 & 2010  . CESAREAN SECTION  07/14/2011   Procedure: CESAREAN SECTION;  Surgeon: Eldred Manges, MD;  Location: [facility];  Service: Gynecology;  Laterality: N/A;  Repat C/S  [facility] 07/21/11  . CESAREAN SECTION     C/S x 3  . SCAR REVISION  07/14/2011   Procedure: SCAR REVISION;  Surgeon: Eldred Manges, MD;  Location: [facility];  Service: Gynecology;;  . WISDOM TOOTH EXTRACTION      FAMILY HISTORY: Family History  Problem Relation Age of Onset  . Hypertension Mother   . Diabetes Mother   . Arthritis-Osteo Mother   . Cancer Mother     uterine, stage 1  . Hypertension Brother     1 brother  . Sleep  apnea Brother     1 brother  . Crohn's disease Brother     1 brother  . Depression Mother   . Depression Father   . Suicidality Cousin   . Depression Cousin   . Hypertension Cousin   . Depression Brother   . Hypertension Maternal Aunt   . Heart disease Paternal Aunt   . Cancer Paternal Aunt   . Heart disease Paternal Uncle   . Hypertension Maternal Grandfather   . Heart disease Maternal Grandfather   . Diabetes Paternal Grandmother   . Kidney disease Paternal Grandmother   . Asthma Paternal Grandmother   . Cancer Paternal Grandmother   . COPD Paternal Grandfather   . Heart disease Paternal Grandfather     SOCIAL HISTORY: Social History   Social History  . Marital status: Divorced    Spouse name: N/A  . Number of children: N/A  . Years of education: N/A   Occupational History  . Not on file.   Social History Main Topics  . Smoking status: Former Smoker    Quit date: 06/07/2015  . Smokeless tobacco: Never Used  . Alcohol use No     Comment: socially  . Drug use: No     Comment: Once to twice per week early in pregnancy and currently. Uses to stop thoughts about wanting her hurt self or husband. Last use 05/2011  . Sexual activity: Yes    Birth control/ protection: None     Comment: Last intercourse: January 2013   Other Topics Concern  . Not on file   Social History Narrative   ** Merged History Encounter **       Monica [name] was born and grew up in [place], [place]. She has 3 older brothers. Her parents remain together, but she is emotionally distant from them. She graduated from [place] [employer] in 2005 with a with a bachelor of science in athletic administration. 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 She has been married once and has 3 children, a 45-month-old daughter, a six-year-old daughter, and a 35-year-old son. She is currently  separated from her husband, and they are in a legal battle with one another. She affiliates as apostolic coldness. She reports her social support network consists of her mother, her  father, cousins, a friend in Sports coach. She enjoys working out and playing basketball.      PHYSICAL EXAM  Vitals:   01/12/16 0859  Weight: 212 lb 12.8 oz (96.5 kg)  Height: 6' (1.829 m)   Body mass index is 28.86 kg/m.  Generalized: Well developed, in no acute distress   Neurological examination  Mentation: Alert oriented to time, place, history taking. Follows all commands speech and language fluent Cranial nerve II-XII: Pupils were equal round reactive to light. Extraocular movements were full, visual field were full on confrontational test. Facial sensation and strength were normal. Uvula tongue midline. Head turning and shoulder shrug  were normal and symmetric. Motor: The motor testing reveals 5 over 5 strength of all 4 extremities. Good symmetric motor tone is noted throughout.  Sensory: Sensory testing is intact to soft touch on all 4 extremities. No evidence of extinction is noted.  Coordination: Cerebellar testing reveals good finger-nose-finger and heel-to-shin bilaterally.  Gait and station: Gait is normal. Tandem gait is normal. Romberg is negative. No drift is seen.  Reflexes: Deep tendon reflexes are symmetric and normal bilaterally.   DIAGNOSTIC DATA (LABS, IMAGING, TESTING) - I reviewed patient records, labs, notes, testing and imaging myself where available.  Lab Results  Component Value Date   WBC 4.5 06/17/2015   HGB 10.9 (L) 06/17/2015   HCT 33.3 (L) 06/17/2015   MCV 91.5 06/17/2015   PLT 203 06/17/2015       ASSESSMENT AND PLAN 35 y.o. year old female  has a past medical history of Anemia; Anxiety; Bacterial vaginosis; Bipolar affective disorder ([place]); Depression; Genital warts (2002); H/O varicella; Headache; History of HPV infection; History of pyelonephritis; Hyperemesis  arising during pregnancy (2010); Hypertension; Migraine; Mild dysplasia of cervix (CIN I); Obesity; PTSD (post-traumatic stress disorder); and Thyroid nodule. here with:  1. Migraine headaches 2. Third trimester pregnancy  The patient is in her third trimester of pregnancy. She is having daily headaches. She is very limited on what medication she can try. For that reason we will try trigger point injections weekly until she delivers. Patient is amenable to this plan. She has signed the consent form. She understands the risk and benefits associated with this procedure. She will return in one week or sooner if needed.  Performed by Dr. Jaynee Eagles M.D. And Monica Givens NP 7.5 ml Lidocaine 2%,7.18ml Marcaine 0.5% in the 27-gauge needle was used. All procedures  were medically necessary, reasonable and appropriate based on the patient's history, medical diagnosis and physician opinion. Verbal informed consent was obtained from the patient, patient was informed of potential risk of procedure, including bruising, bleeding, hematoma formation, infection, muscle weakness, muscle pain, numbness, transient hypertension, transient hyperglycemia and transient insomnia among others. All areas injected were topically clean with isopropyl rubbing alcohol. Nonsterile nonlatex gloves were worn during the procedure.  1. Greater occipital nerve block [phone]). The greater occipital nerve site was identified at the nuchal line medial to the occipital artery. Medication was injected into the left and right occipital nerve areas and suboccipital areas. Patient's condition is associated with inflammation of the greater occipital nerve and associated multiple groups. Injection was deemed medically necessary, reasonable and appropriate. Injection represents a separate and unique surgical service.  2. Lesser occipital nerve block [phone]). The lesser occipital nerve site was identified approximately 2 cm lateral to the greater occipital  nerve. Occasion was injected into the left and right occipital nerve areas. Patient's condition is associated with inflammation of the lesser occipital nerve  and associated muscle groups. Injection was deemed medically necessary, reasonable and appropriate. Injection represents a separate and unique surgical service.   3. Auriculotemporal nerve block [id number]): The Auriculotemporal nerve site was identified along the posterior margin of the sternocleidomastoid muscle toward the base of the ear. Medication was injected into the left and right radicular temporal nerve areas. Patient's condition is associated with inflammation of the Auriculotemporal Nerve and associated muscle groups. Injection was deemed medically necessary, reasonable and appropriate. Injection represents a separate and unique surgical service.  4. Supraorbital nerve block ([id number]): Supraorbital nerve site was identified along the incision of the frontal bone on the orbital/supraorbital ridge. Medication was injected into the left and right supraorbital nerve areas. Patient's condition is associated with inflammation of the supraorbital and associated muscle groups. Injection was deemed medically necessary, reasonable and appropriate. Injection represents a separate and unique surgical service.  5. Facial nerve block [phone]): Temporal nerve branch of the facial nerve was identified. Medication was injected into the left and right facial nerve areas. Patient's condition is associated with inflammation of the facial nerve and associated muscle groups. Injection was deemed medically necessary, reasonable and appropriate. Injection represents a separate and unique surgical service.     Monica Givens, MSN, NP-C 01/12/2016, 9:01 AM [facility] [address] [place], [place] [address] [phone]

## 2016-01-12 NOTE — Patient Instructions (Addendum)
Nerve blocks next week If your symptoms worsen or you develop new symptoms please let us know.

## 2016-01-12 NOTE — Progress Notes (Signed)
Nerve Block: Lidocaine 2%-7.27mL Lot: [id number] Expiration 05/2019 NDC: [id number]  Marcaine 0.5%-7.54mL Lot: [id number] Expiration: 07/09/2017 NDC: [phone]

## 2016-01-13 ENCOUNTER — Telehealth: Payer: Self-pay

## 2016-01-13 NOTE — Telephone Encounter (Signed)
I called to discuss FMLA paperwork for this pt. Since [facility] is following her for migraines, we can only write for her to be out of work intermittently (not full time, OBGYN would be the one to decide if she needs to be out of work full time since they are treating her for pregnancy). If pt is ok with this, we can proceed with the [facility] paperwork.  Pt did not answer, and her VM is full. I will try again later.

## 2016-01-13 NOTE — Telephone Encounter (Signed)
I spoke to pt. I advised her that since [facility] is only treating her for migraines, we can only write her out of work intermittently, not continuously. Pt states, "Well, I was hoping you could write me out of work from now on. But I guess I will just take what I can get." She said that she has already asked Dr. Raphael Gibney (OBGYN) to fill out FMLA paperwork for her migraines but he declined, saying that the neurologist should fill that out, but he agreed to complete pt's short term disability for her pregnancy.  Of note, pt has included a short term disability form and a FMLA form to be filled out.    I advised her again that we can only fill out these forms reflecting her need off from work for her migraines. Pt verbalized understanding. Pt wants me to speak to [facility], NP and make sure that she is still agreeable to filling out both forms.

## 2016-01-13 NOTE — Telephone Encounter (Signed)
Jinny Blossom, NP agreed to fill out pt's FMLA paperwork but not her short term disability. Megan asked me to call pt to find out when pt went out of work.  I spoke to pt and advised her of this. She is agreeable and will ask her OBGYN to do her short term disability. Pt reports that she went out of work on 01/06/2016. I advised pt that the paperwork was sent to MR for processing and MR will call her when she can pick up a copy. I advised her of the $50 fee. Pt verbalized understanding.

## 2016-01-14 ENCOUNTER — Telehealth: Payer: Self-pay | Admitting: *Deleted

## 2016-01-14 NOTE — Telephone Encounter (Signed)
Pt form faxed to [place] @ [phone]

## 2016-01-18 ENCOUNTER — Telehealth: Payer: Self-pay | Admitting: Adult Health

## 2016-01-18 ENCOUNTER — Ambulatory Visit: Payer: BLUE CROSS/BLUE SHIELD | Attending: Family Medicine | Admitting: Physical Therapy

## 2016-01-18 ENCOUNTER — Telehealth: Payer: Self-pay | Admitting: Physical Therapy

## 2016-01-18 NOTE — Telephone Encounter (Signed)
I called Monica [name] this AM to remind her of her appointment today for which she was a "no-show".  I left her a message asking her to please come to her next appt (Tues 01/25/16) or please call to cancel.  I reminded her of our attendance policy.

## 2016-01-18 NOTE — Telephone Encounter (Signed)
I called the patient to see if trigger point injections were beneficial from last week. The patient is scheduled for trigger point injections again tomorrow 10/11 at 4. If the trigger point injections were not beneficial we will need to cancel this appointment. Advised patient to call our office.

## 2016-01-19 ENCOUNTER — Ambulatory Visit (INDEPENDENT_AMBULATORY_CARE_PROVIDER_SITE_OTHER): Payer: BLUE CROSS/BLUE SHIELD | Admitting: Adult Health

## 2016-01-19 ENCOUNTER — Encounter: Payer: Self-pay | Admitting: Adult Health

## 2016-01-19 VITALS — BP 111/71 | HR 101 | Ht 66.5 in | Wt 213.2 lb

## 2016-01-19 DIAGNOSIS — R51 Headache: Secondary | ICD-10-CM | POA: Diagnosis not present

## 2016-01-19 DIAGNOSIS — R519 Headache, unspecified: Secondary | ICD-10-CM

## 2016-01-19 DIAGNOSIS — G43019 Migraine without aura, intractable, without status migrainosus: Secondary | ICD-10-CM

## 2016-01-19 NOTE — Progress Notes (Signed)
Nerve Block: Lidocaine 2%- 7.5 mL total used Lot: [id number] Expiration 09/2019 NDC: [id number]  Marcaine 0.5%- 7.5 ML total used Lot: [id number] Expiration: 05/2017 NDC: [id number]

## 2016-01-19 NOTE — Telephone Encounter (Signed)
error 

## 2016-01-19 NOTE — Progress Notes (Addendum)
PATIENT: Monica [name] DOB: [birth year]  REASON FOR VISIT: follow up- migraines, pregnancy HISTORY FROM: patient  HISTORY OF PRESENT ILLNESS: Today 01/19/2016: Monica [name] is a 35 year old female with a history of migraine headaches. She was here one week ago and we did trigger point injections. She reports that this was beneficial. She states that she did not take any medication until Monday. Today she has a headache but states that it was bad this morning so she took Fioricet. Her headache is a 4/10 currently. She states that the temporal region is still sore. She would like to forgo injections in the temporal region. She comes back today for injections.   History 01/19/16:Monica [name] is a 35 year old female with a history of migraine headaches. She is in her third trimester of pregnancy. She has a scheduled cesarean section for November. She is having migraines throughout her pregnancy. She's been using Fioricet 4-5 times a week. In the past she has not been any daily medications. Her OB/GYN did not want her on codeine either. She reports that her headaches have gotten worse. She states that she's been having preterm contractions and her headache severity seems to coincide with the contractions. She states that she's 100% effaced and dilated however she was given Procardia to prolong delivery. She states that she cannot tolerate taking Procardia and Fioricet at the same time. She states her headaches are a 7 out of 10. Her headaches are located across the forehead. She does have photophobia. With severe headache she may have nausea or vomiting. She returns today for an evaluation.  HISTORY 10/28/15: Monica [name] is a 35 year old female with a history of migraine headaches. She returns today for follow-up. The patient is in her third trimester of pregnancy. Her due date is in November. She reports had she continues to have a daily headache. She uses Fioricet 4-5 times a week. She states  that it is beneficial. Her OB/GYN did not want her taking codeine. The patient states that she does have trouble with her thyroid. 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 Dr. Brett Fairy has suggested an MRI of the brain to be obtained during her third trimester. This was ordered at the last visit. The patient has not yet scheduled this. She returns today for an evaluation.  HISTORY 09/22/15 [facility]): Monica [name] is a 35 y.o. female , seen here as a referral from Dr. Mancel Bale for a headache evaluation,  [name] reports that her headaches for which she sees me today begun about a year ago. At the time she decided to stop smoking and use nicotine patches. She also exercised regularly and she lost 15 pounds. Her headaches were present but a little less intense after she had lost weight. She begun taking over-the-counter nonsteroidals to control the headaches but soon realized that she was taking ibuprofen daily. And of January 2017 she saw her gynecologist for a Pap smear. And she noted during the exam that the thyroid was enlarged. She was seen by endocrinology soon after, she was found to have a goiter with nodules that were non-cancerous. Her endocrinologist found that her TSH was was normal but T3 and T4 were reduced. Her endocrinologist therefore found that a neurology evaluation is necessary to see if the thyroid dysfunction as cause of the headaches are not. The patient is currently [redacted] weeks pregnant- this is her fourth pregnancy.  Monica [name] reports that her headaches are associated with visual distortion it is difficult  for her to judge the disturbance parts of the picture may be in and out of focus she also has often a visual aura before the headaches that on in full force usually fortification lines, bright lights and firework sensations. Her headaches are radiating from behind the eye with and retro-orbital pressure sensation to the temple and  for head and to the nape of the neck. When her headaches are radiating she will have "whole head hurting. She also has a lot of nausea with this. She feels tired and fatigued and sluggish even before she got pregnant and is concerned that this is from the thyroid is well. I was able to review the patient's initial labs, she is restless positive antibody screen was negative, hemoglobin was 11.6 there is a negative sexual disease screen. At this time Monica [name] feels exhausted and just bad. She would like to get rid of her headaches and she would like to be evaluated by neurology to see if also her fatigue and malaise are related to a neuroendocrine disorder.   Chief complaint according to patient : HEADACHES, migrainously, with aura and with status . Was taking Ibuprofen daily- recently changed to butalbutal with caffeine. Transformed migraine.   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 She goes to bed usually between 9 and 9:30 PM, her children go to bed earlier at 8:30. She does not have trouble falling asleep she wakes up frequently throughout the night often related to anxiety and sometimes due to the urge to urinate. She wakens at 6 AM regularly to get ready for work and gets her kids to school. She is a single living alone with her 3 children. Her daytime is busy and she has no time to nap. Social history: Unmarried, expecting her 4 th child. She has discontinued caffeine was in the first couple of months of her pregnancy, she does know longer smoke, no alcohol use in pregnancy.  REVIEW OF SYSTEMS: Out of a complete 14 system review of symptoms, the patient complains only of the following symptoms, and all other reviewed systems are negative.  Memory loss, dizziness, headache, numbness, agitation, nervous/anxious, double vision, loss of  vision, blurred vision, cold intolerance  ALLERGIES: Allergies  Allergen Reactions  . Latex Itching  . Latex Rash    HOME MEDICATIONS: Outpatient Medications Prior to Visit  Medication Sig Dispense Refill  . Adapalene 0.3 % gel Apply topically at bedtime.    . busPIRone (BUSPAR) 10 MG tablet Take 10 mg by mouth daily as needed (for anxiety).     . cyproheptadine (PERIACTIN) 4 MG tablet Take 1 tablet (4 mg total) by mouth 2 (two) times daily. (Patient taking differently: Take 4 mg by mouth 2 (two) times daily as needed for allergies (headaches). ) 60 tablet 0  . NIFEdipine (PROCARDIA) 10 MG capsule Take 1 capsule (10 mg total) by mouth every 6 (six) hours as needed. 30 capsule 0  . Prenatal Vit-Fe Fumarate-FA (MULTIVITAMIN-PRENATAL) 27-0.8 MG TABS tablet Take 1 tablet by mouth daily at 12 noon.     No facility-administered medications prior to visit.     PAST MEDICAL HISTORY: Past Medical History:  Diagnosis Date  . Anemia   . Anxiety   . Bacterial vaginosis   . Bipolar affective disorder ([place])   . Depression   . Genital warts 2002  .  H/O varicella   . Headache   . History of HPV infection   . History of pyelonephritis   . Hyperemesis arising during pregnancy 2010  . Hypertension   . Migraine   . Mild dysplasia of cervix (CIN I)   . Obesity   . PTSD (post-traumatic stress disorder)   . Thyroid nodule     PAST SURGICAL HISTORY: Past Surgical History:  Procedure Laterality Date  . CESAREAN SECTION  2007 & 2010  . CESAREAN SECTION  07/14/2011   Procedure: CESAREAN SECTION;  Surgeon: Eldred Manges, MD;  Location: [facility];  Service: Gynecology;  Laterality: N/A;  Repat C/S  [facility] 07/21/11  . CESAREAN SECTION     C/S x 3  . SCAR REVISION  07/14/2011   Procedure: SCAR REVISION;  Surgeon: Eldred Manges, MD;  Location: [facility];  Service: Gynecology;;  . WISDOM TOOTH EXTRACTION      FAMILY HISTORY: Family History  Problem Relation Age of Onset  . Hypertension Mother     . Diabetes Mother   . Arthritis-Osteo Mother   . Cancer Mother     uterine, stage 1  . Hypertension Brother     1 brother  . Sleep apnea Brother     1 brother  . Crohn's disease Brother     1 brother  . Depression Mother   . Depression Father   . Suicidality Cousin   . Depression Cousin   . Hypertension Cousin   . Depression Brother   . Hypertension Maternal Aunt   . Heart disease Paternal Aunt   . Cancer Paternal Aunt   . Heart disease Paternal Uncle   . Hypertension Maternal Grandfather   . Heart disease Maternal Grandfather   . Diabetes Paternal Grandmother   . Kidney disease Paternal Grandmother   . Asthma Paternal Grandmother   . Cancer Paternal Grandmother   . COPD Paternal Grandfather   . Heart disease Paternal Grandfather     SOCIAL HISTORY: Social History   Social History  . Marital status: Divorced    Spouse name: N/A  . Number of children: N/A  . Years of education: N/A   Occupational History  . Not on file.   Social History Main Topics  . Smoking status: Former Smoker    Quit date: 06/07/2015  . Smokeless tobacco: Never Used  . Alcohol use No     Comment: socially  . Drug use: No     Comment: Once to twice per week early in pregnancy and currently. Uses to stop thoughts about wanting her hurt self or husband. Last use 05/2011  . Sexual activity: Yes    Birth control/ protection: None     Comment: Last intercourse: January 2013   Other Topics Concern  . Not on file   Social History Narrative   ** Merged History Encounter **       Archisha was born and grew up in [place], [place]. She has 3 older brothers. Her parents remain together, but she is emotionally distant from them. She graduated from [place] [employer] in 2005 with a with a bachelor of science in athletic administration. 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 She  has been married once and has 3 children, a 30-month-old daughter, a six-year-old daughter, and a 1-year-old son. She is currently separated from her husband, and they are in a legal battle with one  another. She affiliates as apostolic coldness. She reports her social support network consists of her mother, her father, cousins, a friend in Sports coach. She enjoys working out and playing basketball.      PHYSICAL EXAM  Vitals:   01/19/16 1604  BP: 111/71  Pulse: (!) 101  Weight: 213 lb 3.2 oz (96.7 kg)  Height: 5' 6.5" (1.689 m)   Body mass index is 33.9 kg/m.  Generalized: Well developed, in no acute distress   Neurological examination  Mentation: Alert oriented to time, place, history taking. Follows all commands speech and language fluent Cranial nerve II-XII: Pupils were equal round reactive to light. Extraocular movements were full, visual field were full on confrontational test. Facial sensation and strength were normal. Uvula tongue midline. Head turning and shoulder shrug  were normal and symmetric. Motor: The motor testing reveals 5 over 5 strength of all 4 extremities. Good symmetric motor tone is noted throughout. Gait and station: Gait is normal.     DIAGNOSTIC DATA (LABS, IMAGING, TESTING) - I reviewed patient records, labs, notes, testing and imaging myself where available.  Lab Results  Component Value Date   WBC 4.5 06/17/2015   HGB 10.9 (L) 06/17/2015   HCT 33.3 (L) 06/17/2015   MCV 91.5 06/17/2015   PLT 203 06/17/2015       ASSESSMENT AND PLAN 35 y.o. year old female  has a past medical history of Anemia; Anxiety; Bacterial vaginosis; Bipolar affective disorder ([place]); Depression; Genital warts (2002); H/O varicella; Headache; History of HPV infection; History of pyelonephritis; Hyperemesis arising during pregnancy (2010); Hypertension; Migraine; Mild dysplasia of cervix (CIN I); Obesity; PTSD (post-traumatic stress disorder); and Thyroid nodule. here with:  1.  Migraine headaches 2. Third trimester pregnancy  The patient had good response from trigger point injections last week. She returns today to have repeat injections. She is still scheduled for cesarean section November 6. We will do weekly injections until delivery. Patient is amenable with this plan.  Trigger point injections:  Performed by Dr. Jaynee Eagles M.D. And Ward Givens NP 7.5 ml Lidocaine 2%,7.36ml Marcaine 0.5% in the 27-gauge needle was used. All procedures  were medically necessary, reasonable and appropriate based on the patient's history, medical diagnosis and physician opinion. Verbal informed consent was obtained from the patient, patient was informed of potential risk of procedure, including bruising, bleeding, hematoma formation, infection, muscle weakness, muscle pain, numbness, transient hypertension, transient hyperglycemia and transient insomnia among others. All areas injected were topically clean with isopropyl rubbing alcohol. Nonsterile nonlatex gloves were worn during the procedure.  1. Greater occipital nerve block [phone]). The greater occipital nerve site was identified at the nuchal line medial to the occipital artery. Medication was injected into the left and right occipital nerve areas and suboccipital areas. Patient's condition is associated with inflammation of the greater occipital nerve and associated multiple groups. Injection was deemed medically necessary, reasonable and appropriate. Injection represents a separate and unique surgical service.  2. Lesser occipital nerve block [phone]). The lesser occipital nerve site was identified approximately 2 cm lateral to the greater occipital nerve. Occasion was injected into the left and right occipital nerve areas. Patient's condition is associated with inflammation of the lesser occipital nerve and associated muscle groups. Injection was deemed medically necessary, reasonable and appropriate. Injection represents a separate and  unique surgical service.  3. Supraorbital nerve block ([id number]): Supraorbital nerve site was identified along the incision of the frontal bone on the orbital/supraorbital ridge. Medication was injected into the left and  right supraorbital nerve areas. Patient's condition is associated with inflammation of the supraorbital and associated muscle groups. Injection was deemed medically necessary, reasonable and appropriate. Injection represents a separate and unique surgical service.     Ward Givens, MSN, NP-C 01/19/2016, 1:14 PM [facility] [address], [place], [place] [address] [phone]  Personally have participated in and made any corrections needed to history, physical, neuro exam,assessment and plan as stated above and participated in procedure.  Sarina Ill, MD [facility]

## 2016-01-20 DIAGNOSIS — Z0289 Encounter for other administrative examinations: Secondary | ICD-10-CM

## 2016-01-25 ENCOUNTER — Ambulatory Visit: Payer: BLUE CROSS/BLUE SHIELD | Admitting: Physical Therapy

## 2016-01-26 ENCOUNTER — Ambulatory Visit: Payer: Self-pay | Admitting: Adult Health

## 2016-01-27 ENCOUNTER — Ambulatory Visit: Payer: BLUE CROSS/BLUE SHIELD | Admitting: Neurology

## 2016-01-31 ENCOUNTER — Telehealth (HOSPITAL_COMMUNITY): Payer: Self-pay | Admitting: *Deleted

## 2016-01-31 ENCOUNTER — Encounter (HOSPITAL_COMMUNITY): Payer: Self-pay

## 2016-01-31 NOTE — Telephone Encounter (Signed)
Preadmission screen  

## 2016-02-01 ENCOUNTER — Encounter: Payer: BLUE CROSS/BLUE SHIELD | Admitting: Physical Therapy

## 2016-02-01 ENCOUNTER — Encounter (HOSPITAL_COMMUNITY): Payer: Self-pay

## 2016-02-10 NOTE — Progress Notes (Signed)
Personally have participated in and made any corrections needed to history, physical, neuro exam,assessment and plan as stated above and participated in procedure.  Sarina Ill, MD [facility]

## 2016-02-11 ENCOUNTER — Encounter (HOSPITAL_COMMUNITY)
Admission: RE | Admit: 2016-02-11 | Discharge: 2016-02-11 | Disposition: A | Discharge status: Home / Self Care | Payer: BLUE CROSS/BLUE SHIELD | Source: Ambulatory Visit | Attending: Obstetrics and Gynecology | Admitting: Obstetrics and Gynecology

## 2016-02-11 HISTORY — DX: Unspecified abnormal cytological findings in specimens from vagina: R87.629

## 2016-02-11 LAB — CBC
HCT: 32.2 % — ABNORMAL LOW (ref 36.0–46.0)
Hemoglobin: 10.5 g/dL — ABNORMAL LOW (ref 12.0–15.0)
MCH: 28.7 pg (ref 26.0–34.0)
MCHC: 32.6 g/dL (ref 30.0–36.0)
MCV: 88 fL (ref 78.0–100.0)
PLATELETS: 175 10*3/uL (ref 150–400)
RBC: 3.66 MIL/uL — AB (ref 3.87–5.11)
RDW: 13.7 % (ref 11.5–15.5)
WBC: 6.6 10*3/uL (ref 4.0–10.5)

## 2016-02-11 NOTE — Patient Instructions (Signed)
[place]  02/11/2016   Your procedure is scheduled on:  02/14/2016  Enter through the Main Entrance of [facility] at [place] up the phone at the desk and dial [phone].   Call this number if you have problems the morning of surgery: [phone]   Remember:   Do not eat food:After Midnight.  Do not drink clear liquids: After Midnight.  Take these medicines the morning of surgery with A SIP OF WATER: take Buspar   Do not wear jewelry, make-up or nail polish.  Do not wear lotions, powders, or perfumes. Do not wear deodorant.  Do not shave 48 hours prior to surgery.  Do not bring valuables to the hospital.  [facility] is not   responsible for any belongings or valuables brought to the hospital.  Contacts, dentures or bridgework may not be worn into surgery.  Leave suitcase in the car. After surgery it may be brought to your room.  For patients admitted to the hospital, checkout time is 11:00 AM the day of              discharge.   Patients discharged the day of surgery will not be allowed to drive             home.  Name and phone number of your driver: na  Special Instructions:   N/A   Please read over the following fact sheets that you were given:   Surgical Site Infection Prevention

## 2016-02-12 ENCOUNTER — Inpatient Hospital Stay (HOSPITAL_COMMUNITY)
Admission: AD | Admit: 2016-02-12 | Discharge: 2016-02-12 | Disposition: A | Discharge status: Home / Self Care | Payer: BLUE CROSS/BLUE SHIELD | Source: Ambulatory Visit | Attending: Obstetrics & Gynecology | Admitting: Obstetrics & Gynecology

## 2016-02-12 ENCOUNTER — Encounter (HOSPITAL_COMMUNITY): Payer: Self-pay | Admitting: *Deleted

## 2016-02-12 DIAGNOSIS — Z3A38 38 weeks gestation of pregnancy: Secondary | ICD-10-CM

## 2016-02-12 DIAGNOSIS — O34219 Maternal care for unspecified type scar from previous cesarean delivery: Secondary | ICD-10-CM | POA: Insufficient documentation

## 2016-02-12 DIAGNOSIS — O471 False labor at or after 37 completed weeks of gestation: Secondary | ICD-10-CM | POA: Insufficient documentation

## 2016-02-12 LAB — RPR: RPR Ser Ql: NONREACTIVE

## 2016-02-12 LAB — TYPE AND SCREEN
ABO/RH(D): O POS
ANTIBODY SCREEN: NEGATIVE

## 2016-02-12 NOTE — MAU Provider Note (Signed)
History     CSN: [id number]  Arrival date and time: 02/12/16 [id number]   None     Chief Complaint  Patient presents with  . Contractions   HPI Pt is a 35 yo female G5P3013 at 38.5 IUP presents for increased contractions.  Denies bleeding or leaking of fluid.  Fm+.  Prenatal hx remarkable for previous LTCS x 3.  Repeat scheduled on Monday.   OB History    Gravida Para Term Preterm AB Living   5 3 3  0 1 3   SAB TAB Ectopic Multiple Live Births   0 0 0   3      Past Medical History:  Diagnosis Date  . Anemia   . Anxiety   . Bacterial vaginosis   . Bipolar affective disorder ([place])   . Depression    has pp depresion after deliveries needs to see therapist and medications  . Genital warts 2002  . H/O varicella   . Headache   . History of HPV infection   . Hyperemesis arising during pregnancy 2010  . Hypertension   . Migraine   . Mild dysplasia of cervix (CIN I)   . Obesity   . PTSD (post-traumatic stress disorder)   . Thyroid nodule   . Vaginal Pap smear, abnormal     Past Surgical History:  Procedure Laterality Date  . CESAREAN SECTION  2007 & 2010  . CESAREAN SECTION  07/14/2011   Procedure: CESAREAN SECTION;  Surgeon: Eldred Manges, MD;  Location: [facility];  Service: Gynecology;  Laterality: N/A;  Repat C/S  [facility] 07/21/11  . CESAREAN SECTION     C/S x 3  . SCAR REVISION  07/14/2011   Procedure: SCAR REVISION;  Surgeon: Eldred Manges, MD;  Location: [facility];  Service: Gynecology;;  . WISDOM TOOTH EXTRACTION      Family History  Problem Relation Age of Onset  . Hypertension Mother   . Diabetes Mother   . Arthritis-Osteo Mother   . Cancer Mother     uterine, stage 1  . Suicidality Cousin   . Depression Cousin   . Hypertension Cousin   . Depression Brother   . Sleep apnea Brother   . Hypertension Brother   . Heart disease Brother   . Heart disease Paternal Grandfather   . Crohn's disease Brother   . Hypertension Brother   . Hypertension Maternal Aunt   .  Heart disease Paternal Aunt   . Cancer Paternal Aunt   . Heart disease Paternal Uncle   . Hypertension Maternal Grandfather   . Heart disease Maternal Grandfather   . Diabetes Paternal Grandmother   . Asthma Paternal Grandmother     Social History  Substance Use Topics  . Smoking status: Former Smoker    Quit date: 06/07/2015  . Smokeless tobacco: Never Used  . Alcohol use No     Comment: socially    Allergies:  Allergies  Allergen Reactions  . Latex Itching and Rash    Prescriptions Prior to Admission  Medication Sig Dispense Refill Last Dose  . Adapalene 0.3 % gel Apply topically at bedtime.   02/12/2016 at Unknown time  . busPIRone (BUSPAR) 10 MG tablet Take 10 mg by mouth daily as needed (for anxiety).    02/11/2016 at Unknown time  . butalbital-acetaminophen-caffeine (FIORICET, ESGIC) 50-325-40 MG tablet Take 1 tablet by mouth as needed for headache.   Past Week at Unknown time  . calcium carbonate (TUMS - DOSED IN MG  ELEMENTAL CALCIUM) 500 MG chewable tablet Chew 2 tablets by mouth daily.   02/12/2016 at Unknown time  . cyproheptadine (PERIACTIN) 4 MG tablet Take 1 tablet (4 mg total) by mouth 2 (two) times daily. (Patient taking differently: Take 4 mg by mouth 2 (two) times daily as needed for allergies (headaches). ) 60 tablet 0 02/11/2016 at Unknown time  . Prenatal Vit-Fe Fumarate-FA (MULTIVITAMIN-PRENATAL) 27-0.8 MG TABS tablet Take 1 tablet by mouth daily at 12 noon.   02/11/2016 at Unknown time  . NIFEdipine (PROCARDIA) 10 MG capsule Take 1 capsule (10 mg total) by mouth every 6 (six) hours as needed. 30 capsule 0 Taking    ROS GI negative for constipation GU pelvic pain All other systems reviewed and are negative Physical Exam   Blood pressure 112/75, pulse 101, temperature 97.5 F (36.4 C), resp. rate 18, height 5' 6.5" (1.689 m), weight 97.9 kg (215 lb 12.8 oz), last menstrual period 05/17/2015, SpO2 99 %.  Physical Exam Constitutional Orientated person, place  and time.  Well developed and no distress Head Normocephalic Neck no lymphadenopathy, no thyroid enlargement Cardiovascular RRR without murmur Respiratory Clear bilaterally GI soft gravid  GU none Skin warm and dry SVE LTC Fetal Status Cat 1 strip No contractions on strip or palpated MAU Course  Procedures NST  MDM   Assessment and Plan  35yo G5P3013 at 38.5 IUP false labor Cat 1 strip Plan Discharge home follow up on Monday for LTCS Draw TYSC due to ID band being cut off  Pleas Koch Jeidy Hoerner 02/12/2016, 9:22 AM

## 2016-02-12 NOTE — MAU Note (Signed)
Ctxs since 1500 Friday. Came home and went to sleep and ctxs woke me up at 2200. STronger ctxs since 0200. Denies LOF or bleeding. For repeat C/S this Monday

## 2016-02-12 NOTE — MAU Note (Signed)
[place] CNM concerning patient.  Per CNM check patient's cervix and notify on coming CNM of results.

## 2016-02-12 NOTE — MAU Note (Signed)
Notified Irene Shipper CNM patient presents with c/o contractions, only one or two contractions noted, some UI, cervix 0/thick, fhr has been reactive, CNM to come to MAU to speak to patient.

## 2016-02-12 NOTE — Discharge Instructions (Signed)
Vaginal Delivery °During delivery, your health care provider will help you give birth to your baby. During a vaginal delivery, you will work to push the baby out of your vagina. However, before you can push your baby out, a few things need to happen. The opening of your uterus (cervix) has to soften, thin out, and open up (dilate) all the way to 10 cm. Also, your baby has to move down from the uterus into your vagina.  °SIGNS OF LABOR  °Your health care provider will first need to make sure you are in labor. Signs of labor include:  °· Passing what is called the mucous plug before labor begins. This is a small amount of blood-stained mucus. °· Having regular, painful uterine contractions.   °· The time between contractions gets shorter.   °· The discomfort and pain gradually get more intense. °· Contraction pains get worse when walking and do not go away when resting.   °· Your cervix becomes thinner (effacement) and dilates. °BEFORE THE DELIVERY °Once you are in labor and admitted into the hospital or care center, your health care provider may do the following:  °· Perform a complete physical exam. °· Review any complications related to pregnancy or labor.  °· Check your blood pressure, pulse, temperature, and heart rate (vital signs).   °· Determine if, and when, the rupture of amniotic membranes occurred. °· Do a vaginal exam (using a sterile glove and lubricant) to determine:   °¨ The position (presentation) of the baby. Is the baby's head presenting first (vertex) in the birth canal (vagina), or are the feet or buttocks first (breech)?   °¨ The level (station) of the baby's head within the birth canal.   °¨ The effacement and dilatation of the cervix.   °· An electronic fetal monitor is usually placed on your abdomen when you first arrive. This is used to monitor your contractions and the baby's heart rate. °¨ When the monitor is on your abdomen (external fetal monitor), it can only pick up the frequency and  length of your contractions. It cannot tell the strength of your contractions. °¨ If it becomes necessary for your health care provider to know exactly how strong your contractions are or to see exactly what the baby's heart rate is doing, an internal monitor may be inserted into your vagina and uterus. Your health care provider will discuss the benefits and risks of using an internal monitor and obtain your permission before inserting the device. °¨ Continuous fetal monitoring may be needed if you have an epidural, are receiving certain medicines (such as oxytocin), or have pregnancy or labor complications. °· An IV access tube may be placed into a vein in your arm to deliver fluids and medicines if necessary. °THREE STAGES OF LABOR AND DELIVERY °Normal labor and delivery is divided into three stages. °First Stage °This stage starts when you begin to contract regularly and your cervix begins to efface and dilate. It ends when your cervix is completely open (fully dilated). The first stage is the longest stage of labor and can last from 3 hours to 15 hours.  °Several methods are available to help with labor pain. You and your health care provider will decide which option is best for you. Options include:  °· Opioid medicines. These are strong pain medicines that you can get through your IV tube or as a shot into your muscle. These medicines lessen pain but do not make it go away completely.  °· Epidural. A medicine is given through a thin tube that   is inserted in your back. The medicine numbs the lower part of your body and prevents any pain in that area. °· Paracervical pain medicine. This is an injection of an anesthetic on each side of your cervix.   °· You may request natural childbirth, which does not involve the use of pain medicines or an epidural during labor and delivery. Instead, you will use other things, such as breathing exercises, to help cope with the pain. °Second Stage °The second stage of labor  begins when your cervix is fully dilated at 10 cm. It continues until you push your baby down through the birth canal and the baby is born. This stage can take only minutes or several hours. °· The location of your baby's head as it moves through the birth canal is reported as a number called a station. If the baby's head has not started its descent, the station is described as being at minus 3 (-3). When your baby's head is at the zero station, it is at the middle of the birth canal and is engaged in the pelvis. The station of your baby helps indicate the progress of the second stage of labor. °· When your baby is born, your health care provider may hold the baby with his or her head lowered to prevent amniotic fluid, mucus, and blood from getting into the baby's lungs. The baby's mouth and nose may be suctioned with a small bulb syringe to remove any additional fluid. °· Your health care provider may then place the baby on your stomach. It is important to keep the baby from getting cold. To do this, the health care provider will dry the baby off, place the baby directly on your skin (with no blankets between you and the baby), and cover the baby with warm, dry blankets.   °· The umbilical cord is cut. °Third Stage °During the third stage of labor, your health care provider will deliver the placenta (afterbirth) and make sure your bleeding is under control. The delivery of the placenta usually takes about 5 minutes but can take up to 30 minutes. After the placenta is delivered, a medicine may be given either by IV or injection to help contract the uterus and control bleeding. If you are planning to breastfeed, you can try to do so now. °After you deliver the placenta, your uterus should contract and get very firm. If your uterus does not remain firm, your health care provider will massage it. This is important because the contraction of the uterus helps cut off bleeding at the site where the placenta was attached  to your uterus. If your uterus does not contract properly and stay firm, you may continue to bleed heavily. If there is a lot of bleeding, medicines may be given to contract the uterus and stop the bleeding.  °  °This information is not intended to replace advice given to you by your health care provider. Make sure you discuss any questions you have with your health care provider. °  °Document Released: 01/04/2008 Document Revised: 04/17/2014 Document Reviewed: 11/22/2011 °Elsevier Interactive Patient Education ©2016 [employer] ° °

## 2016-02-14 ENCOUNTER — Encounter (HOSPITAL_COMMUNITY)
Admission: RE | Disposition: A | Discharge status: Home / Self Care | Payer: Self-pay | Source: Ambulatory Visit | Attending: Obstetrics and Gynecology

## 2016-02-14 ENCOUNTER — Encounter (HOSPITAL_COMMUNITY): Payer: Self-pay | Admitting: Emergency Medicine

## 2016-02-14 ENCOUNTER — Inpatient Hospital Stay (HOSPITAL_COMMUNITY): Payer: BLUE CROSS/BLUE SHIELD | Admitting: Anesthesiology

## 2016-02-14 ENCOUNTER — Inpatient Hospital Stay (HOSPITAL_COMMUNITY)
Admission: RE | Admit: 2016-02-14 | Discharge: 2016-02-16 | DRG: 765 | Disposition: A | Discharge status: Home / Self Care | Payer: BLUE CROSS/BLUE SHIELD | Source: Ambulatory Visit | Attending: Obstetrics and Gynecology | Admitting: Obstetrics and Gynecology

## 2016-02-14 DIAGNOSIS — F418 Other specified anxiety disorders: Secondary | ICD-10-CM | POA: Diagnosis present

## 2016-02-14 DIAGNOSIS — O34211 Maternal care for low transverse scar from previous cesarean delivery: Principal | ICD-10-CM | POA: Diagnosis present

## 2016-02-14 DIAGNOSIS — Z3A39 39 weeks gestation of pregnancy: Secondary | ICD-10-CM

## 2016-02-14 DIAGNOSIS — Z87891 Personal history of nicotine dependence: Secondary | ICD-10-CM

## 2016-02-14 DIAGNOSIS — F329 Major depressive disorder, single episode, unspecified: Secondary | ICD-10-CM | POA: Diagnosis present

## 2016-02-14 DIAGNOSIS — O1002 Pre-existing essential hypertension complicating childbirth: Secondary | ICD-10-CM | POA: Diagnosis present

## 2016-02-14 LAB — PREPARE RBC (CROSSMATCH)

## 2016-02-14 SURGERY — Surgical Case
Anesthesia: Spinal | Site: Abdomen | Wound class: Clean Contaminated

## 2016-02-14 MED ORDER — KETOROLAC TROMETHAMINE 30 MG/ML IJ SOLN: 30.0000 mg | Freq: Four times a day (QID) | INTRAMUSCULAR | Status: AC | PRN

## 2016-02-14 MED ORDER — NALOXONE HCL 0.4 MG/ML IJ SOLN: 0.4000 mg | INTRAMUSCULAR | Status: DC | PRN

## 2016-02-14 MED ORDER — BUSPIRONE HCL 10 MG PO TABS
10.0000 mg | ORAL_TABLET | Freq: Every morning | ORAL | Status: DC
  Filled 2016-02-14 (×2): qty 1

## 2016-02-14 MED ORDER — BUSPIRONE HCL 10 MG PO TABS
10.0000 mg | ORAL_TABLET | Freq: Every morning | ORAL | Status: DC
  Filled 2016-02-14 (×3): qty 1

## 2016-02-14 MED ORDER — MORPHINE SULFATE (PF) 0.5 MG/ML IJ SOLN: INTRAMUSCULAR | Status: DC | PRN

## 2016-02-14 MED ORDER — ZOLPIDEM TARTRATE 5 MG PO TABS: 5.0000 mg | ORAL_TABLET | Freq: Every evening | ORAL | Status: DC | PRN

## 2016-02-14 MED ORDER — DEXAMETHASONE SODIUM PHOSPHATE 4 MG/ML IJ SOLN
INTRAMUSCULAR | Status: AC
  Filled 2016-02-14: qty 1

## 2016-02-14 MED ORDER — BUPIVACAINE-EPINEPHRINE (PF) 0.5% -1:200000 IJ SOLN
INTRAMUSCULAR | Status: AC
Start: 2016-02-14 — End: 2016-02-14
  Filled 2016-02-14: qty 30

## 2016-02-14 MED ORDER — COCONUT OIL OIL
1.0000 "application " | TOPICAL_OIL | Status: DC | PRN
  Administered 2016-02-15: 1 via TOPICAL
  Filled 2016-02-14: qty 120

## 2016-02-14 MED ORDER — SCOPOLAMINE 1 MG/3DAYS TD PT72
MEDICATED_PATCH | TRANSDERMAL | Status: AC
  Administered 2016-02-14: 1.5 mg via TRANSDERMAL
  Filled 2016-02-14: qty 1

## 2016-02-14 MED ORDER — DIBUCAINE 1 % RE OINT: 1.0000 "application " | TOPICAL_OINTMENT | RECTAL | Status: DC | PRN

## 2016-02-14 MED ORDER — BUPIVACAINE IN DEXTROSE 0.75-8.25 % IT SOLN
INTRATHECAL | Status: DC | PRN
  Administered 2016-02-14: 1.6 mL via INTRATHECAL

## 2016-02-14 MED ORDER — LACTATED RINGERS IV SOLN
INTRAVENOUS | Status: DC | PRN
  Administered 2016-02-14 (×2): via INTRAVENOUS

## 2016-02-14 MED ORDER — PHENYLEPHRINE 8 MG IN D5W 100 ML (0.08MG/ML) PREMIX OPTIME
INJECTION | INTRAVENOUS | Status: DC | PRN
  Administered 2016-02-14: 60 ug/min via INTRAVENOUS

## 2016-02-14 MED ORDER — DIPHENHYDRAMINE HCL 50 MG/ML IJ SOLN
12.5000 mg | INTRAMUSCULAR | Status: DC | PRN
  Administered 2016-02-14: 12.5 mg via INTRAVENOUS
  Filled 2016-02-14: qty 1

## 2016-02-14 MED ORDER — HYDROMORPHONE HCL 2 MG PO TABS
2.0000 mg | ORAL_TABLET | ORAL | Status: DC | PRN
  Administered 2016-02-15 (×2): 2 mg via ORAL
  Filled 2016-02-14 (×2): qty 1

## 2016-02-14 MED ORDER — DEXAMETHASONE SODIUM PHOSPHATE 4 MG/ML IJ SOLN
INTRAMUSCULAR | Status: DC | PRN
  Administered 2016-02-14: 4 mg via INTRAVENOUS

## 2016-02-14 MED ORDER — BUPIVACAINE-EPINEPHRINE 0.5% -1:200000 IJ SOLN
INTRAMUSCULAR | Status: DC | PRN
  Administered 2016-02-14: 10 mL

## 2016-02-14 MED ORDER — SENNOSIDES-DOCUSATE SODIUM 8.6-50 MG PO TABS
2.0000 | ORAL_TABLET | ORAL | Status: DC
  Administered 2016-02-15 (×2): 2 via ORAL
  Filled 2016-02-14 (×2): qty 2

## 2016-02-14 MED ORDER — PRENATAL MULTIVITAMIN CH
1.0000 | ORAL_TABLET | Freq: Every day | ORAL | Status: DC
  Administered 2016-02-15 – 2016-02-16 (×2): 1 via ORAL
  Filled 2016-02-14 (×2): qty 1

## 2016-02-14 MED ORDER — SIMETHICONE 80 MG PO CHEW
80.0000 mg | CHEWABLE_TABLET | Freq: Three times a day (TID) | ORAL | Status: DC
  Administered 2016-02-14 – 2016-02-16 (×5): 80 mg via ORAL
  Filled 2016-02-14 (×6): qty 1

## 2016-02-14 MED ORDER — OXYTOCIN 10 UNIT/ML IJ SOLN
INTRAMUSCULAR | Status: DC | PRN
  Administered 2016-02-14: 40 [IU] via INTRAMUSCULAR

## 2016-02-14 MED ORDER — SODIUM CHLORIDE 0.9% FLUSH: 3.0000 mL | INTRAVENOUS | Status: DC | PRN

## 2016-02-14 MED ORDER — SODIUM CHLORIDE 0.9 % IR SOLN
Status: DC | PRN
  Administered 2016-02-14: 1000 mL

## 2016-02-14 MED ORDER — DIPHENHYDRAMINE HCL 25 MG PO CAPS: 25.0000 mg | ORAL_CAPSULE | Freq: Four times a day (QID) | ORAL | Status: DC | PRN

## 2016-02-14 MED ORDER — NALBUPHINE HCL 10 MG/ML IJ SOLN
INTRAMUSCULAR | Status: AC
  Administered 2016-02-14: 5 mg via INTRAVENOUS
  Filled 2016-02-14: qty 1

## 2016-02-14 MED ORDER — NALBUPHINE HCL 10 MG/ML IJ SOLN: 5.0000 mg | Freq: Once | INTRAMUSCULAR | Status: DC | PRN

## 2016-02-14 MED ORDER — DIPHENHYDRAMINE HCL 25 MG PO CAPS
25.0000 mg | ORAL_CAPSULE | ORAL | Status: DC | PRN
  Filled 2016-02-14: qty 1

## 2016-02-14 MED ORDER — CEFAZOLIN SODIUM-DEXTROSE 2-4 GM/100ML-% IV SOLN
2.0000 g | INTRAVENOUS | Status: AC
  Administered 2016-02-14: 2 g via INTRAVENOUS

## 2016-02-14 MED ORDER — FENTANYL CITRATE (PF) 100 MCG/2ML IJ SOLN
INTRAMUSCULAR | Status: DC | PRN
  Administered 2016-02-14: 10 ug via INTRATHECAL

## 2016-02-14 MED ORDER — LACTATED RINGERS IV SOLN
Freq: Once | INTRAVENOUS | Status: AC
  Administered 2016-02-14: 09:00:00 via INTRAVENOUS

## 2016-02-14 MED ORDER — MORPHINE SULFATE-NACL 0.5-0.9 MG/ML-% IV SOSY
PREFILLED_SYRINGE | INTRAVENOUS | Status: AC
  Filled 2016-02-14: qty 1

## 2016-02-14 MED ORDER — OXYTOCIN 40 UNITS IN LACTATED RINGERS INFUSION - SIMPLE MED: 2.5000 [IU]/h | INTRAVENOUS | Status: AC

## 2016-02-14 MED ORDER — DEXTROSE 5 % IV SOLN
1.0000 ug/kg/h | INTRAVENOUS | Status: DC | PRN
  Filled 2016-02-14: qty 2

## 2016-02-14 MED ORDER — MEASLES, MUMPS & RUBELLA VAC ~~LOC~~ INJ
0.5000 mL | INJECTION | Freq: Once | SUBCUTANEOUS | Status: DC
  Filled 2016-02-14: qty 0.5

## 2016-02-14 MED ORDER — MORPHINE SULFATE-NACL 0.5-0.9 MG/ML-% IV SOSY
PREFILLED_SYRINGE | INTRAVENOUS | Status: DC | PRN
  Administered 2016-02-14: .2 mg via EPIDURAL

## 2016-02-14 MED ORDER — ONDANSETRON HCL 4 MG/2ML IJ SOLN: 4.0000 mg | Freq: Three times a day (TID) | INTRAMUSCULAR | Status: DC | PRN

## 2016-02-14 MED ORDER — SCOPOLAMINE 1 MG/3DAYS TD PT72: 1.0000 | MEDICATED_PATCH | Freq: Once | TRANSDERMAL | Status: DC

## 2016-02-14 MED ORDER — ONDANSETRON HCL 4 MG/2ML IJ SOLN
INTRAMUSCULAR | Status: DC | PRN
  Administered 2016-02-14: 4 mg via INTRAVENOUS

## 2016-02-14 MED ORDER — WITCH HAZEL-GLYCERIN EX PADS: 1.0000 "application " | MEDICATED_PAD | CUTANEOUS | Status: DC | PRN

## 2016-02-14 MED ORDER — FENTANYL CITRATE (PF) 100 MCG/2ML IJ SOLN
INTRAMUSCULAR | Status: AC
  Filled 2016-02-14: qty 2

## 2016-02-14 MED ORDER — MISOPROSTOL 200 MCG PO TABS
800.0000 ug | ORAL_TABLET | Freq: Once | ORAL | Status: AC
  Administered 2016-02-14: 800 ug via ORAL

## 2016-02-14 MED ORDER — SIMETHICONE 80 MG PO CHEW
80.0000 mg | CHEWABLE_TABLET | ORAL | Status: DC
  Administered 2016-02-15 (×2): 80 mg via ORAL
  Filled 2016-02-14 (×2): qty 1

## 2016-02-14 MED ORDER — ACETAMINOPHEN 325 MG PO TABS: 650.0000 mg | ORAL_TABLET | ORAL | Status: DC | PRN

## 2016-02-14 MED ORDER — MEDROXYPROGESTERONE ACETATE 150 MG/ML IM SUSP: 150.0000 mg | INTRAMUSCULAR | Status: DC | PRN

## 2016-02-14 MED ORDER — MENTHOL 3 MG MT LOZG: 1.0000 | LOZENGE | OROMUCOSAL | Status: DC | PRN

## 2016-02-14 MED ORDER — LACTATED RINGERS IV SOLN
INTRAVENOUS | Status: DC
  Administered 2016-02-14 (×2): via INTRAVENOUS

## 2016-02-14 MED ORDER — PHENYLEPHRINE 8 MG IN D5W 100 ML (0.08MG/ML) PREMIX OPTIME
INJECTION | INTRAVENOUS | Status: AC
  Filled 2016-02-14: qty 100

## 2016-02-14 MED ORDER — NALBUPHINE HCL 10 MG/ML IJ SOLN
5.0000 mg | INTRAMUSCULAR | Status: DC | PRN
  Administered 2016-02-14: 5 mg via INTRAVENOUS
  Administered 2016-02-15 – 2016-02-16 (×4): 5 mg via SUBCUTANEOUS
  Filled 2016-02-14 (×4): qty 1

## 2016-02-14 MED ORDER — MEPERIDINE HCL 25 MG/ML IJ SOLN: 6.2500 mg | INTRAMUSCULAR | Status: DC | PRN

## 2016-02-14 MED ORDER — IBUPROFEN 600 MG PO TABS
600.0000 mg | ORAL_TABLET | Freq: Four times a day (QID) | ORAL | Status: DC
  Administered 2016-02-14 – 2016-02-16 (×8): 600 mg via ORAL
  Filled 2016-02-14 (×8): qty 1

## 2016-02-14 MED ORDER — OXYTOCIN 10 UNIT/ML IJ SOLN
INTRAMUSCULAR | Status: AC
  Filled 2016-02-14: qty 4

## 2016-02-14 MED ORDER — SIMETHICONE 80 MG PO CHEW: 80.0000 mg | CHEWABLE_TABLET | ORAL | Status: DC | PRN

## 2016-02-14 MED ORDER — TETANUS-DIPHTH-ACELL PERTUSSIS 5-2.5-18.5 LF-MCG/0.5 IM SUSP: 0.5000 mL | Freq: Once | INTRAMUSCULAR | Status: DC

## 2016-02-14 MED ORDER — LACTATED RINGERS IV SOLN: INTRAVENOUS | Status: DC

## 2016-02-14 MED ORDER — MISOPROSTOL 200 MCG PO TABS
ORAL_TABLET | ORAL | Status: AC
  Filled 2016-02-14: qty 4

## 2016-02-14 MED ORDER — NALBUPHINE HCL 10 MG/ML IJ SOLN
5.0000 mg | INTRAMUSCULAR | Status: DC | PRN
  Administered 2016-02-14 – 2016-02-15 (×4): 5 mg via INTRAVENOUS
  Filled 2016-02-14 (×4): qty 1

## 2016-02-14 MED ORDER — ONDANSETRON HCL 4 MG/2ML IJ SOLN
INTRAMUSCULAR | Status: AC
  Filled 2016-02-14: qty 2

## 2016-02-14 MED ORDER — BUSPIRONE HCL 10 MG PO TABS: 10.0000 mg | ORAL_TABLET | Freq: Every morning | ORAL | Status: DC

## 2016-02-14 SURGICAL SUPPLY — 31 items
CHLORAPREP W/TINT 26ML (MISCELLANEOUS) ×3 IMPLANT
CLAMP CORD UMBIL (MISCELLANEOUS) ×2 IMPLANT
CLOTH BEACON ORANGE TIMEOUT ST (SAFETY) ×3 IMPLANT
DRSG OPSITE POSTOP 4X10 (GAUZE/BANDAGES/DRESSINGS) ×3 IMPLANT
DRSG PAD ABDOMINAL 8X10 ST (GAUZE/BANDAGES/DRESSINGS) ×2 IMPLANT
ELECT REM PT RETURN 9FT ADLT (ELECTROSURGICAL) ×3
ELECTRODE REM PT RTRN 9FT ADLT (ELECTROSURGICAL) ×1 IMPLANT
GLOVE BIOGEL PI IND STRL 7.0 (GLOVE) ×1 IMPLANT
GLOVE BIOGEL PI IND STRL 8.5 (GLOVE) ×1 IMPLANT
GLOVE BIOGEL PI INDICATOR 7.0 (GLOVE) ×8
GLOVE BIOGEL PI INDICATOR 8.5 (GLOVE) ×2
GLOVE SURG SS PI 6.0 STRL IVOR (GLOVE) ×2 IMPLANT
GLOVE SURG SS PI 8.0 STRL IVOR (GLOVE) ×2 IMPLANT
GOWN STRL REUS W/TWL LRG LVL3 (GOWN DISPOSABLE) ×9 IMPLANT
NEEDLE HYPO 22GX1.5 SAFETY (NEEDLE) ×3 IMPLANT
PACK C SECTION WH (CUSTOM PROCEDURE TRAY) ×3 IMPLANT
PAD OB MATERNITY 4.3X12.25 (PERSONAL CARE ITEMS) ×3 IMPLANT
PENCIL SMOKE EVAC W/HOLSTER (ELECTROSURGICAL) ×3 IMPLANT
RINGERS IRRIG 1000ML POUR BTL (IV SOLUTION) ×3 IMPLANT
SPONGE GAUZE 4X4 12PLY (GAUZE/BANDAGES/DRESSINGS) ×2 IMPLANT
SUT MNCRL AB 3-0 PS2 27 (SUTURE) ×2 IMPLANT
SUT VIC AB 0 CT1 27 (SUTURE) ×6
SUT VIC AB 0 CT1 27XBRD ANBCTR (SUTURE) ×2 IMPLANT
SUT VIC AB 2-0 CTX 36 (SUTURE) ×6 IMPLANT
SUT VIC AB 3-0 CT1 27 (SUTURE) ×3
SUT VIC AB 3-0 CT1 TAPERPNT 27 (SUTURE) IMPLANT
SUT VIC AB 3-0 SH 27 (SUTURE) ×3
SUT VIC AB 3-0 SH 27X BRD (SUTURE) IMPLANT
SYR CONTROL 10ML LL (SYRINGE) ×3 IMPLANT
TAPE CLOTH SURG 4X10 WHT LF (GAUZE/BANDAGES/DRESSINGS) ×2 IMPLANT
TOWEL OR 17X24 6PK STRL BLUE (TOWEL DISPOSABLE) ×3 IMPLANT

## 2016-02-14 NOTE — Anesthesia Preprocedure Evaluation (Addendum)
Anesthesia Evaluation  Patient identified by MRN, date of birth, ID band Patient awake    Reviewed: Allergy & Precautions, NPO status , Patient's Chart, lab work & pertinent test results  Airway Mallampati: II  TM Distance: >3 FB     Dental   Pulmonary former smoker,    breath sounds clear to auscultation       Cardiovascular hypertension, Pt. on medications  Rhythm:Regular Rate:Normal     Neuro/Psych  Headaches, Anxiety Depression Bipolar Disorder    GI/Hepatic negative GI ROS, Neg liver ROS,   Endo/Other  negative endocrine ROS  Renal/GU negative Renal ROS     Musculoskeletal   Abdominal   Peds  Hematology  (+) anemia ,   Anesthesia Other Findings   Reproductive/Obstetrics (+) Pregnancy Repeat c-section x3                            Lab Results  Component Value Date   WBC 6.6 02/11/2016   HGB 10.5 (L) 02/11/2016   HCT 32.2 (L) 02/11/2016   MCV 88.0 02/11/2016   PLT 175 02/11/2016   Lab Results  Component Value Date   CREATININE 0.61 09/03/2012   BUN 12 09/03/2012   NA 138 09/03/2012   K 3.7 09/03/2012   CL 105 09/03/2012   CO2 22 09/03/2012    Anesthesia Physical Anesthesia Plan  ASA: III  Anesthesia Plan: Spinal   Post-op Pain Management:    Induction:   Airway Management Planned: Natural Airway  Additional Equipment:   Intra-op Plan:   Post-operative Plan:   Informed Consent: I have reviewed the patients History and Physical, chart, labs and discussed the procedure including the risks, benefits and alternatives for the proposed anesthesia with the patient or authorized representative who has indicated his/her understanding and acceptance.     Plan Discussed with: CRNA  Anesthesia Plan Comments:         Anesthesia Quick Evaluation

## 2016-02-14 NOTE — Progress Notes (Signed)
[name] is a 35 y.o. year old female. The patient had a repeat cesarean section today. She had post operative bleeding from the vagina that was greater than expected. She received 800 g of Cytotec by mouth.  Subjective:  Good pain control. Ambulating without dizziness.  Objective:  BP 108/64 (BP Location: Right Arm)   Pulse 96   Temp 97.5 F (36.4 C) (Oral)   Resp 18   LMP 05/17/2015   SpO2 94%   Breastfeeding? Unknown    CBC    Component Value Date/Time   WBC 6.6 02/11/2016 1147   RBC 3.66 (L) 02/11/2016 1147   HGB 10.5 (L) 02/11/2016 1147   HCT 32.2 (L) 02/11/2016 1147   PLT 175 02/11/2016 1147   MCV 88.0 02/11/2016 1147   MCH 28.7 02/11/2016 1147   MCHC 32.6 02/11/2016 1147   RDW 13.7 02/11/2016 1147   LYMPHSABS 2.0 06/17/2015 1821   MONOABS 0.3 06/17/2015 1821   EOSABS 0.1 06/17/2015 1821   BASOSABS 0.0 06/17/2015 1821    Appropriate lochia according to her nurse  Assessment:  Postpartum day 0 from repeat cesarean section  Resolved post cesarean bleeding after 800 g of Cytotec by mouth  Plan:  Continue routine post cesarean section care  CBC in the morning  Check orthostatic blood pressures in the morning  Gildardo Cranker M.D. 02/14/2016  6:52 PM

## 2016-02-14 NOTE — Lactation Note (Signed)
This note was copied from a baby's chart. Lactation Consultation Note  Patient Name: Monica [name] [id number] Date: 02/14/2016 Reason for consult: Initial assessment (baby STS presently asleep, LC encouraged mom to call with feeding cues for Latch assessement on the nurses light )  Baby is 7 hours old and has been to the breast x 3  for 15 -20 mins. One Latch score =6. Voided x 1.  This mom is and experienced BF mother of 3 other kids length of time BF was 3 - 6 months. Per mom this baby has latched the quickest after birth and seems to  Be breast feeding well. LC discussed with mom even though she is an experienced breast feeder . Every baby is different. LC discussed importance of a feeding  Assessment by MBU RN / [place] and encouraged mom to call with feeding cues , if the [facility] is busy the [facility] RN can assess feeding and score.  Due to the baby being skin to skin LC unable to review  And show mom hand expressing. LC did recommended prior to every latch 1st breast - breast massage, hand express. Mother informed of post-discharge support and given phone number to the lactation department, including services for phone call assistance; out-patient appointments; and breastfeeding support group. List of other breastfeeding resources in the community given in the handout. Encouraged mother to call for problems or concerns related to breastfeeding.   Maternal Data Does the patient have breastfeeding experience prior to this delivery?: Yes  Feeding Feeding Type: Breast Fed Length of feed: 20 min (per mom reports swallows )  LATCH Score/Interventions                      Lactation Tools Discussed/Used WIC Program: No   Consult Status Consult Status: Follow-up Date: 02/14/16 Follow-up type: In-patient    [place] 02/14/2016, 6:19 PM

## 2016-02-14 NOTE — Anesthesia Postprocedure Evaluation (Signed)
Anesthesia Post Note  Patient: Monica [name]  Procedure(s) Performed: Procedure(s) (LRB): CESAREAN SECTION (N/A)  Patient location during evaluation: Mother Baby Anesthesia Type: Spinal Level of consciousness: awake Pain management: satisfactory to patient Vital Signs Assessment: post-procedure vital signs reviewed and stable Respiratory status: spontaneous breathing Cardiovascular status: stable Anesthetic complications: no     Last Vitals:  Vitals:   02/14/16 1550 02/14/16 2003  BP: 108/64 107/73  Pulse: 96 97  Resp: 18 18  Temp:  36.9 C    Last Pain:  Vitals:   02/14/16 2003  TempSrc: Axillary  PainSc:    Pain Goal: Patients Stated Pain Goal: 3 (02/14/16 [id number])               Casimer Lanius

## 2016-02-14 NOTE — Progress Notes (Signed)
Prenatal labs: ABO, Rh:             --/--/O POS (11/04 0910) HBsAg:                 Negative (03/16 0000)  HIV:                       Non-reactive (03/16 0000)  GBS:                      Negative Antibody:              NEG (11/04 0910) Rubella:                Immune RPR:                    Non Reactive (11/03 1147)      Prenatal Transfer Tool  Maternal Diabetes: No Genetic Screening: Normal Maternal Ultrasounds/Referrals: Normal Fetal Ultrasounds or other Referrals:  None Maternal Substance Abuse:  No Significant Maternal Medications:  Buspirone Significant Maternal Lab Results:  None Other Comments:  None  Monica [name] V 02/14/2016, 9:36 AM

## 2016-02-14 NOTE — H&P (Signed)
Admission History and Physical Exam for an Obstetrics Patient  Monica [name] is a 35 y.o. female, [id number], at [redacted]w[redacted]d gestation, who presents for CS. She has been followed at the [facility] and Gynecology division of [facility].  Her pregnancy has been complicated by prior CS. See history below.  OB History    Gravida Para Term Preterm AB Living   5 3 3  0 1 3   SAB TAB Ectopic Multiple Live Births   0 0 0   3      Past Medical History:  Diagnosis Date  . Anemia   . Anxiety   . Bacterial vaginosis   . Bipolar affective disorder ([place])   . Depression    has pp depresion after deliveries needs to see therapist and medications  . Genital warts 2002  . H/O varicella   . Headache   . History of HPV infection   . Hyperemesis arising during pregnancy 2010  . Hypertension   . Migraine   . Mild dysplasia of cervix (CIN I)   . Obesity   . PTSD (post-traumatic stress disorder)   . Thyroid nodule   . Vaginal Pap smear, abnormal     Prescriptions Prior to Admission  Medication Sig Dispense Refill Last Dose  . adapalene (DIFFERIN) 0.1 % cream Apply 1 application topically at bedtime.   02/14/2016 at Unknown time  . busPIRone (BUSPAR) 10 MG tablet Take 10 mg by mouth daily as needed (for anxiety).    02/14/2016 at Unknown time  . butalbital-acetaminophen-caffeine (FIORICET, ESGIC) 50-325-40 MG tablet Take 1 tablet by mouth as needed for headache.   Past Week at Unknown time  . calcium carbonate (TUMS - DOSED IN MG ELEMENTAL CALCIUM) 500 MG chewable tablet Chew 2 tablets by mouth daily.   02/13/2016 at Unknown time  . Prenatal Vit-Fe Fumarate-FA (MULTIVITAMIN-PRENATAL) 27-0.8 MG TABS tablet Take 1 tablet by mouth daily at 12 noon.   02/13/2016 at Unknown time  . cyproheptadine (PERIACTIN) 4 MG tablet Take 1 tablet (4 mg total) by mouth 2 (two) times daily. (Patient not taking: Reported on 02/14/2016) 60 tablet 0 Not Taking at Unknown time  . NIFEdipine  (PROCARDIA) 10 MG capsule Take 1 capsule (10 mg total) by mouth every 6 (six) hours as needed. (Patient not taking: Reported on 02/14/2016) 30 capsule 0 Not Taking at Unknown time    Past Surgical History:  Procedure Laterality Date  . CESAREAN SECTION  2007 & 2010  . CESAREAN SECTION  07/14/2011   Procedure: CESAREAN SECTION;  Surgeon: Eldred Manges, MD;  Location: [facility];  Service: Gynecology;  Laterality: N/A;  Repat C/S  [facility] 07/21/11  . CESAREAN SECTION     C/S x 3  . SCAR REVISION  07/14/2011   Procedure: SCAR REVISION;  Surgeon: Eldred Manges, MD;  Location: [facility];  Service: Gynecology;;  . Arnetha Courser TOOTH EXTRACTION      Allergies  Allergen Reactions  . Latex Itching and Rash    Family History: family history includes Arthritis-Osteo in her mother; Asthma in her paternal grandmother; Cancer in her mother and paternal aunt; Crohn's disease in her brother; Depression in her brother and cousin; Diabetes in her mother and paternal grandmother; Heart disease in her brother, maternal grandfather, paternal aunt, paternal grandfather, and paternal uncle; Hypertension in her brother, brother, cousin, maternal aunt, maternal grandfather, and mother; Sleep apnea in her brother; Suicidality in her cousin.  Social History:  reports that she  quit smoking about 8 months ago. She has never used smokeless tobacco. She reports that she does not drink alcohol or use drugs.  Review of systems: Normal pregnancy complaints.  Admission Physical Exam:    There is no height or weight on file to calculate BMI.  Blood pressure 109/84, pulse 95, temperature 97.9 F (36.6 C), temperature source Oral, resp. rate 15, last menstrual period 05/17/2015, SpO2 97 %.  HEENT:                 Within normal limits Chest:                   Clear Heart:                    Regular rate and rhythm Abdomen:             Gravid and nontender Extremities:          Grossly normal Neurologic exam: Grossly normal Pelvic  exam:         Cervix: closed  Prenatal labs: ABO, Rh:             --/--/O POS (11/04 0910) HBsAg:                 Negative (03/16 0000)  HIV:                       Non-reactive (03/16 0000)  GBS:                       Antibody:              NEG (11/04 0910) Rubella:                !Error! RPR:                    Non Reactive (11/03 1147)        Assessment:  [redacted]w[redacted]d gestation  Prior CS  Plan:  Repeat CS   Elio Haden V 02/14/2016, 9:20 AM

## 2016-02-14 NOTE — Anesthesia Postprocedure Evaluation (Signed)
Anesthesia Post Note  Patient: Monica [name]  Procedure(s) Performed: Procedure(s) (LRB): CESAREAN SECTION (N/A)  Patient location during evaluation: PACU Anesthesia Type: Spinal and MAC Level of consciousness: awake and alert Pain management: pain level controlled Vital Signs Assessment: post-procedure vital signs reviewed and stable Respiratory status: spontaneous breathing and respiratory function stable Cardiovascular status: blood pressure returned to baseline and stable Postop Assessment: spinal receding Anesthetic complications: no     Last Vitals:  Vitals:   02/14/16 1145 02/14/16 1200  BP: 99/82 (!) 100/53  Pulse: 92 83  Resp: (!) 26 (!) 23  Temp:      Last Pain:  Vitals:   02/14/16 1200  TempSrc:   PainSc: 0-No pain   Pain Goal: Patients Stated Pain Goal: 3 (02/14/16 0823)               Tiajuana Amass

## 2016-02-14 NOTE — Op Note (Signed)
OPERATIVE NOTE  Patient's Name: Monica [name]  Date of Birth: 07-Apr-1981   Medical Records Number: [id number]   Date of Operation: 02/14/2016   Preoperative diagnosis:  [redacted]w[redacted]d weeks gestation  Prior Cesaren Section X 3  Desires Repeat Cesarean Section  Postoperative diagnosis:  [redacted]w[redacted]d weeks gestation  Prior Cesaren Section X 3  Desires Repeat Cesarean Section  Procedure:  Repeat low transverse cesarean section  Surgeon:  Gildardo Cranker, M.D.  Assistant:  Hospital RNFA  Anesthesia:  Spinal  Disposition:  Monica [name] is a 35 y.o. female who presents at [redacted]w[redacted]d weeks gestation. The patient has been followed at the [facility] obstetrics and gynecology division of [facility]. This pregnancy has been complicated by a prior cesarean section X 3. The patient desires a repeat cesarean section. She understands the indications for her procedure and she accepts the risk of, but not limited to, anesthetic complications, bleeding, infections, and possible damage to the surrounding organs. She does not desire sterilization.  Findings:  A  female [facility]) was delivered from a occiput posterior position.  The Apgar scores were 8/9.  The uterus, fallopian tubes, and ovaries were normal for the gravid state.  Procedure:  The patient was taken to the operating room where a spinal anesthetic was given.The perineum was prepped with betadine. A Foley catheter was placed in the bladder.The patient's abdomen was prepped with Duraprep.   The patient was sterilely draped. A "timeout" was performed which properly identified the patient and the correct operative procedure. The lower abdomen was injected with half percent Marcaine with epinephrine. A low transverse incision was made in the abdomen and carried sharply through the subcutaneous tissue, the fascia, and the anterior peritoneum. An incision was made in the lower uterine segment. The incision  was extended in a low transverse fashion. The membranes were ruptured. The fetal head was delivered without difficulty. The mouth and nose were suctioned. The remainder of the infant was then delivered. The cord was clamped and cut. The infant was handed to the awaiting pediatric team. The placenta was removed. The uterine cavity was cleaned of amniotic fluid, clotted blood, and membranes. The uterine incision was closed using a running locking suture of 2-0 Vicryl. An imbricating suture of 2-0 Vicryl was placed. The pelvis was vigorously irrigated. Hemostasis was adequate. The anterior peritoneum and the abdominal musculature were closed using 2-0 Vicryl. The fascia was closed using a running suture of 0 Vicryl followed by 3 interrupted sutures of 0 Vicryl. The subcutaneous layer was closed using interrupted sutures of 2-0 Vicryl. The skin was reapproximated using a subcuticular suture of 3-0 Monocryl. Sponge, needle, and instrument counts were correct on 2 occasions. The estimated blood loss for the procedure was 750 cc. The patient tolerated her procedure well. She was transported to the recovery room in stable condition. The infant remained in the operating room with the mother for bonding. The placenta was sent to labor and delivery.  Gildardo Cranker, M.D. 02/14/2016 10:53 AM

## 2016-02-14 NOTE — Transfer of Care (Addendum)
Immediate Anesthesia Transfer of Care Note  Patient: Monica [name]  Procedure(s) Performed: Procedure(s): CESAREAN SECTION (N/A)  Patient Location: PACU  Anesthesia Type:Spinal  Level of Consciousness: awake, alert , oriented and patient cooperative  Airway & Oxygen Therapy: Patient Spontanous Breathing  Post-op Assessment: Report given to RN and Post -op Vital signs reviewed and stable  Post vital signs: Reviewed and stable  Last Vitals: TEMP 97.8  BP 115/70  HR 75  RR 15  POX 99   Last Pain: 0     Patients Stated Pain Goal: 3 ([id number])  Complications: No apparent anesthesia complications

## 2016-02-15 LAB — CBC
HCT: 24 % — ABNORMAL LOW (ref 36.0–46.0)
Hemoglobin: 8.1 g/dL — ABNORMAL LOW (ref 12.0–15.0)
MCH: 29.3 pg (ref 26.0–34.0)
MCHC: 33.8 g/dL (ref 30.0–36.0)
MCV: 87 fL (ref 78.0–100.0)
PLATELETS: 167 10*3/uL (ref 150–400)
RBC: 2.76 MIL/uL — AB (ref 3.87–5.11)
RDW: 14.1 % (ref 11.5–15.5)
WBC: 9 10*3/uL (ref 4.0–10.5)

## 2016-02-15 LAB — BIRTH TISSUE RECOVERY COLLECTION (PLACENTA DONATION)

## 2016-02-15 MED ORDER — OXYCODONE-ACETAMINOPHEN 5-325 MG PO TABS
1.0000 | ORAL_TABLET | ORAL | Status: DC | PRN
  Administered 2016-02-15: 1 via ORAL
  Administered 2016-02-15 – 2016-02-16 (×6): 2 via ORAL
  Filled 2016-02-15 (×7): qty 2

## 2016-02-15 NOTE — Progress Notes (Signed)
Spoke with Rivard about patients pain and not being controled using dilaudid.  This is her 4th c section and patient stated it was controlled with previous surgeries using percocet.  Patient requesting IV to come out and rather take PO pain med.See new orders.

## 2016-02-15 NOTE — Progress Notes (Signed)
Subjective: Postpartum Day 1: Cesarean Delivery Patient reports that pain is well-managed now with Percocet.Lochia normal.  Ambulating, voiding, tolerating diet as ordered without difficulty. Normal flatus.  Absent bowel movement.  Objective: Vital signs in last 24 hours: Temp:  [97.5 F (36.4 C)-98.6 F (37 C)] 97.8 F (36.6 C) (11/07 0900) Pulse Rate:  [72-97] 78 (11/07 0900) Resp:  [12-23] 18 (11/07 0900) BP: (87-143)/(53-122) 104/69 (11/07 0900) SpO2:  [88 %-100 %] 98 % (11/07 0400)  Physical Exam:  General: alert Lochia: appropriate Uterine Fundus: firm and appropriately tender Incision: dressing 3/4 soiled: will replace for clean dressing DVT Evaluation: No evidence of DVT seen on physical exam. Edema 1+   Recent Labs  02/15/16 0559  HGB 8.1*  HCT 24.0*    Assessment/Plan: Status post Cesarean section. Doing well postoperatively.  Continue current care. Anticipate discharge 02/17/16 Circumcision reviewed  Marimar Suber A 02/15/2016, 11:58 AM

## 2016-02-16 LAB — TYPE AND SCREEN
ABO/RH(D): O POS
Antibody Screen: NEGATIVE
UNIT DIVISION: 0
Unit division: 0

## 2016-02-16 MED ORDER — OXYCODONE-ACETAMINOPHEN 5-325 MG PO TABS: 1.0000 | ORAL_TABLET | Freq: Four times a day (QID) | ORAL | 0 refills | Status: DC | PRN

## 2016-02-16 MED ORDER — HYDROCHLOROTHIAZIDE 12.5 MG PO CAPS: 12.5000 mg | ORAL_CAPSULE | Freq: Every day | ORAL | 0 refills | Status: DC

## 2016-02-16 MED ORDER — SERTRALINE HCL 25 MG PO TABS: 25.0000 mg | ORAL_TABLET | Freq: Every day | ORAL | 1 refills | Status: DC

## 2016-02-16 MED ORDER — IBUPROFEN 600 MG PO TABS: 600.0000 mg | ORAL_TABLET | Freq: Four times a day (QID) | ORAL | 1 refills | Status: AC | PRN

## 2016-02-16 NOTE — Clinical Social Work Maternal (Signed)
  CLINICAL SOCIAL WORK MATERNAL/CHILD NOTE  Patient Details  Name: Monica [name] MRN: [id number] Date of Birth: Sep 04, 1980  Date:  02/16/2016  Clinical Social Worker Initiating Note:  Laurey Arrow Date/ Time Initiated:  02/16/16/0935     Child's Name:  Monica [name]   Legal Guardian:  Mother   Need for Interpreter:  None   Date of Referral:  02/16/16     Reason for Referral:  Behavioral Health Issues, including SI    Referral Source:  Central Nursery   Address:  [place] [address]  Phone number:  [phone]   Household Members:  Self, Minor Children   Natural Supports (not living in the home):  Extended Family, Immediate Family, Spouse/significant other, Parent, Friends   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   Education:  Engineer, maintenance Resources:  Multimedia programmer   Other Resources:  [facility]   Cultural/Religious Considerations Which May Impact Care:  None Reported  Strengths:  Ability to meet basic needs , Home prepared for child , Pediatrician chosen , Understanding of illness   Risk Factors/Current Problems:  Mental Health Concerns    Cognitive State:  Alert , Able to Concentrate , Goal Oriented , Linear Thinking , Insightful    Mood/Affect:  Bright , Happy , Comfortable , Interested    CSW Assessment: CSW met with MOB to complete an assessment for hx of anxiety and depression.  MOB was dressed when CSW arrived and appeared relaxed and was engaging in conversation with MOB's room guest.  With MOB's permission, CSW asked MOB's room guest to step out in order for CSW to meet with MOB in private.  MOB informed CSW that MOB room guest was FOB [facility]). CSW inquired about MOB's MH hx.  MOB acknowledged a hx of anxiety and depression.  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 MOB communicated that MOB is currently on Buspar for MOB's anxiety, and it is routine for MOB's OBGYN to place MOB on Lexapro at dc. MOB plans to speak with OB this morning regarding medications for dc. MOB described lack of sleep, loss of appetite, feelings of hopelessness, and feelings of not wanting to taking care of herself after giving birth to her older 3 children. CSW educated MOB about PPD. CSW informed MOB of possible supports and interventions to decrease PPD.  CSW also encouraged MOB to seek medical attention if needed for increased signs and symptoms for PPD.  MOB reported MOB has a therapist at [facility] Erasmo Downer) that MOB can contact if warranted. MOB had no other questions or concerns at this time. CSW thanked MOB for meeting with CSW and provided MOB with CSW contact information.  CSW Plan/Description:  Information/Referral to [employer] , [employer] , No Further Intervention Required/No Barriers to Discharge   Laurey Arrow, MSW, LCSW Clinical Social Work [phone]    Dimple Nanas, LCSW 02/16/2016, 9:39 AM

## 2016-02-16 NOTE — Discharge Summary (Addendum)
Obstetric Discharge Summary Reason for Admission: cesarean section Prenatal Procedures: ultrasound Intrapartum Procedures: C-Section Postpartum Procedures: Circ performed on baby Complications-Operative and Postpartum: none Hemoglobin  Date Value Ref Range Status  02/15/2016 8.1 (L) 12.0 - 15.0 g/dL Final   HCT  Date Value Ref Range Status  02/15/2016 24.0 (L) 36.0 - 46.0 % Final    Physical Exam:  General: alert and no distress Lochia: appropriate Uterine Fundus: firm, NT Incision: healing well, dressing intact DVT Evaluation: No evidence of DVT seen on physical exam.  Discharge Diagnoses: Term Pregnancy-delivered  Discharge Information: Date: 02/16/2016 Activity: pelvic rest Diet: routine Medications: Ibuprofen, Percocet and zoloft and HCTZ Condition: stable Instructions: refer to practice specific booklet Discharge to: home [facility] Follow up in 1 week(s).   Specialty:  Obstetrics and Gynecology Why:  and 6 weeks for PP visit Contact information: [address] [place] [phone]          Newborn Data: Live born female  Birth Weight: 7 lb 15.9 oz (3625 g) APGAR: 8, 9  Home with mother.  Markel Mergenthaler Y 02/16/2016, 11:42 AM

## 2017-03-12 ENCOUNTER — Other Ambulatory Visit: Payer: Self-pay | Admitting: Internal Medicine

## 2017-03-12 DIAGNOSIS — E042 Nontoxic multinodular goiter: Secondary | ICD-10-CM

## 2017-03-13 ENCOUNTER — Ambulatory Visit (HOSPITAL_COMMUNITY)
Admission: RE | Admit: 2017-03-13 | Discharge: 2017-03-13 | Disposition: A | Discharge status: Home / Self Care | Payer: 59 | Source: Ambulatory Visit | Attending: Internal Medicine | Admitting: Internal Medicine

## 2017-03-13 DIAGNOSIS — E079 Disorder of thyroid, unspecified: Secondary | ICD-10-CM | POA: Diagnosis not present

## 2017-03-13 DIAGNOSIS — E042 Nontoxic multinodular goiter: Secondary | ICD-10-CM

## 2017-06-13 IMAGING — US US OB TRANSVAGINAL
1 series · 15 of 28 positions shown · non-contrast
Comparison: None.

CLINICAL DATA: Pregnant, bleeding/cramping

EXAM:
OBSTETRIC <14 WK US AND TRANSVAGINAL OB US
TECHNIQUE: Both transabdominal and transvaginal ultrasound examinations were
performed for complete evaluation of the gestation as well as the
maternal uterus, adnexal regions, and pelvic cul-de-sac.
Transvaginal technique was performed to assess early pregnancy.

[Series 1: us ob transvaginal · 79 acquisitions, 15 frames shown]
[im 1/79]
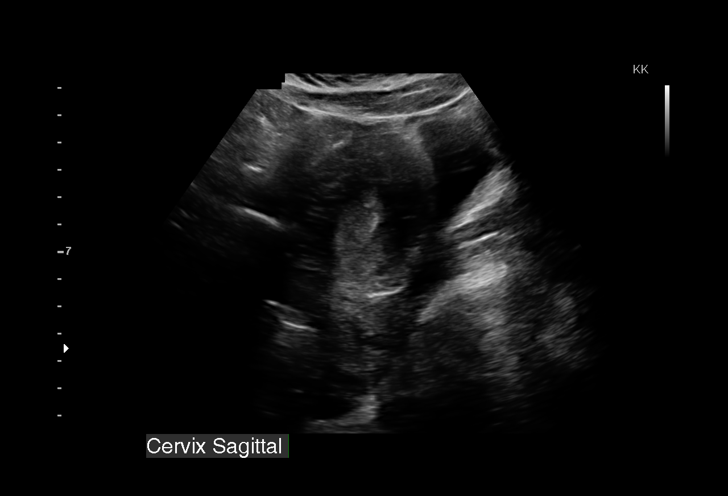
[im 6/79]
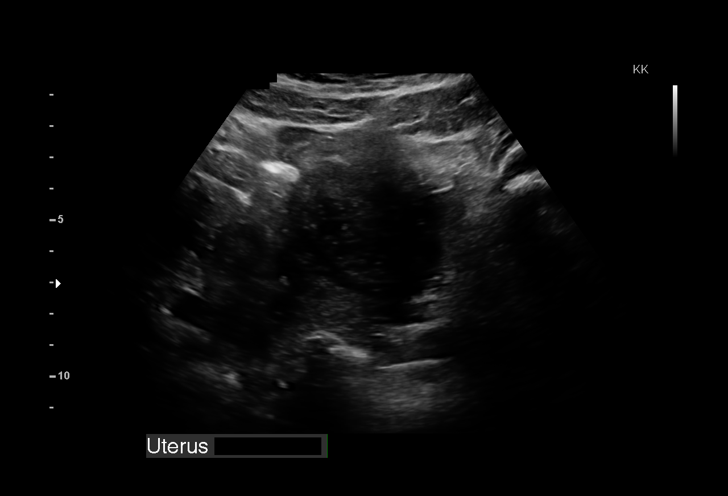
[im 12/79]
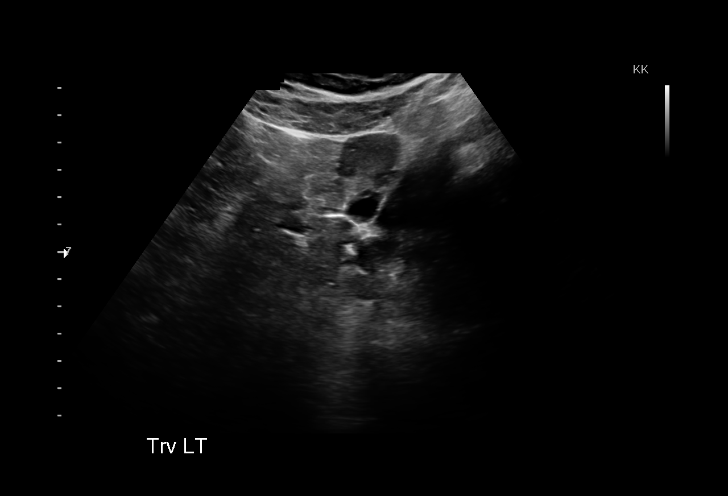
[im 18/79]
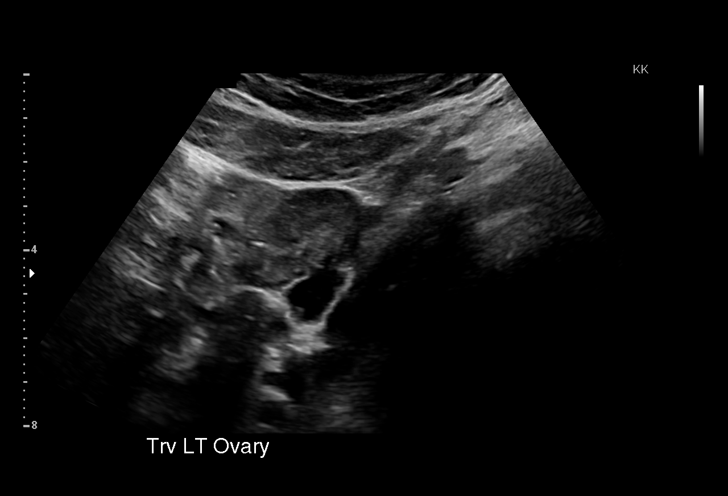
[im 24/79]
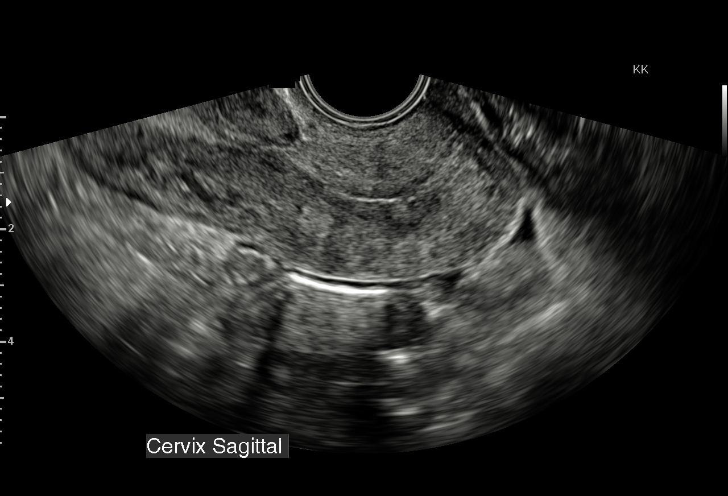
[im 29/79]
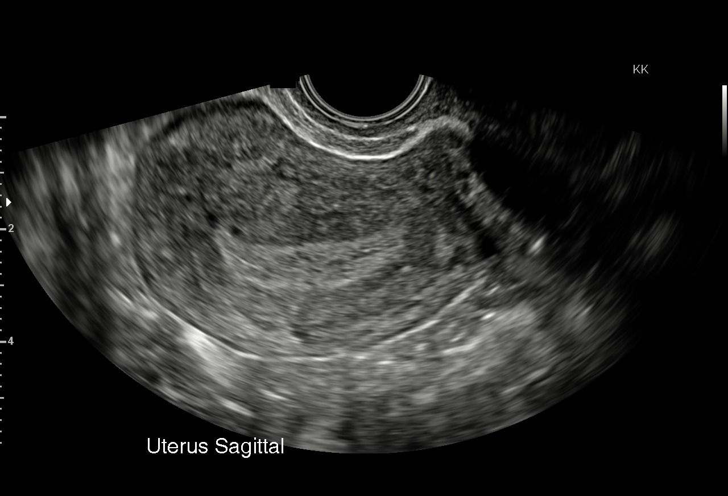
[im 35/79]
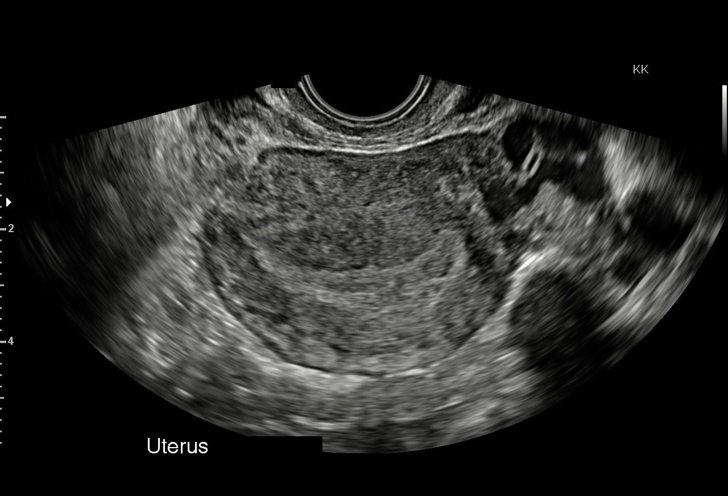
[im 41/79]
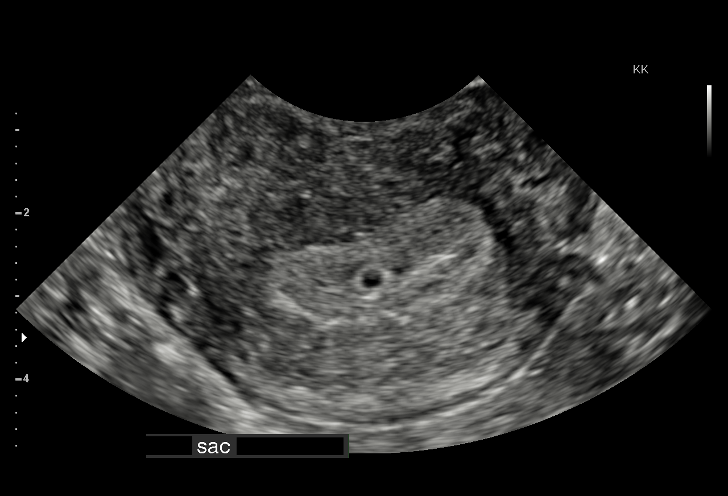
[im 44/79]
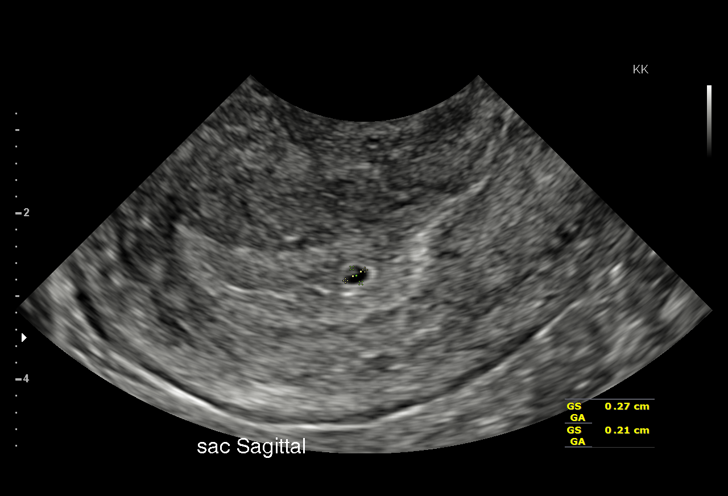
[im 50/79]
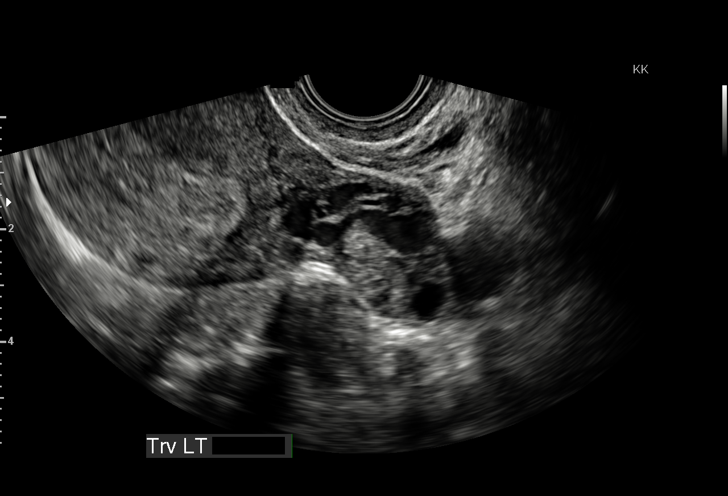
[im 55/79]
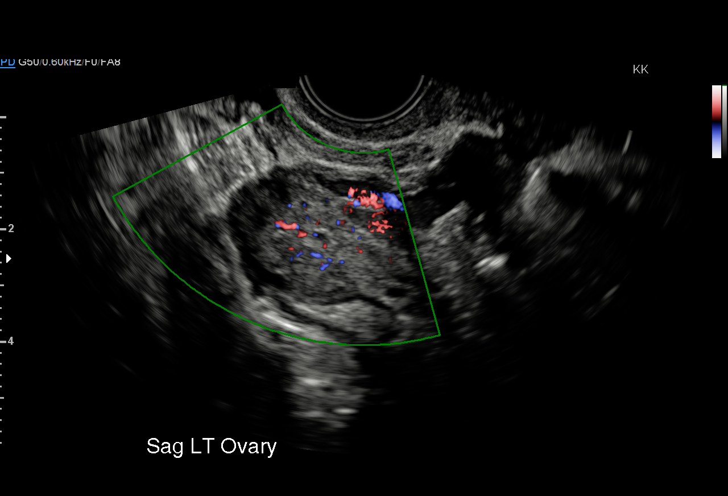
[im 61/79]
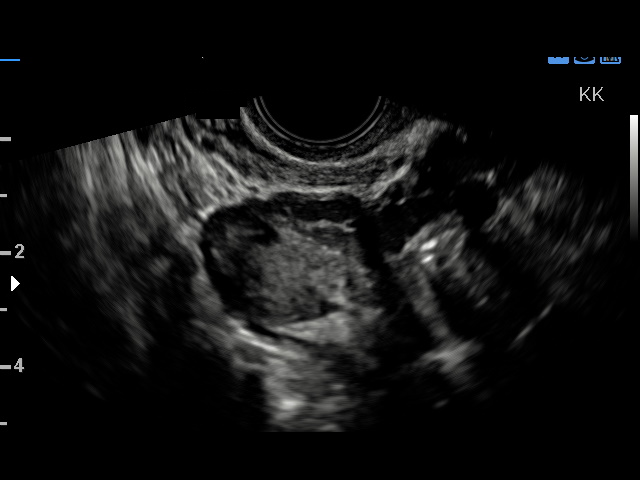
[im 67/79]
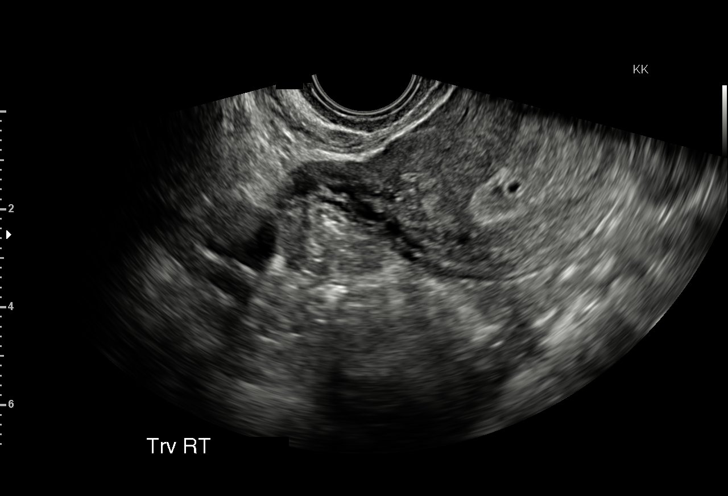
[im 73/79]
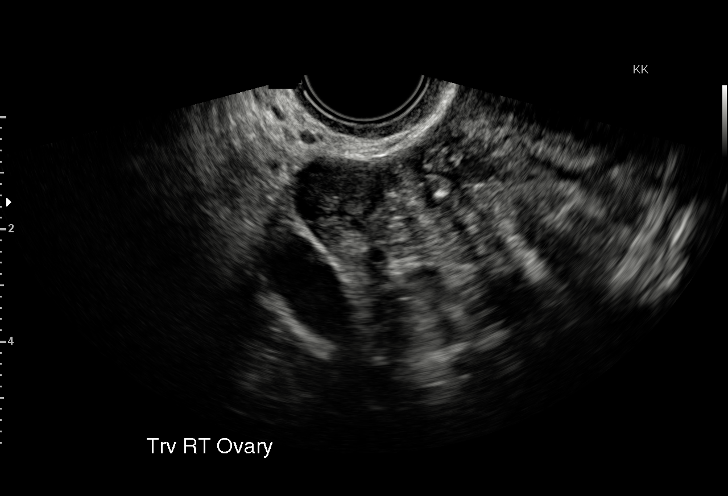
[im 79/79]
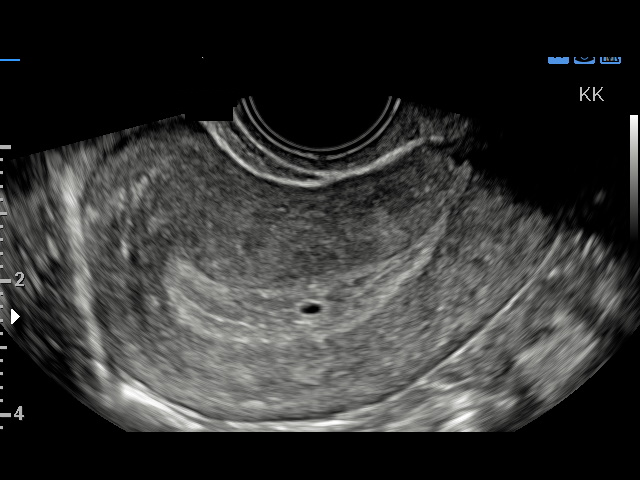

[15 of 28 positions shown; findings below may reference images not displayed]

FINDINGS: Intrauterine gestational sac: Visualized/normal in shape.

Yolk sac:  Not visualized

Embryo:  Not visualized

MSD: 2.4  mm   4 w   6  d

Subchorionic hemorrhage:  None visualized.

Maternal uterus/adnexae: Bilateral ovaries are within normal limits.

Trace pelvic fluid.
IMPRESSION: Single intrauterine gestational sac, measuring 4 weeks 6 days by
mean sac diameter. No yolk sac or fetal pole is visualized.

Consider follow-up pelvic ultrasound in 14 days to confirm viability
as clinically warranted.

## 2017-06-23 ENCOUNTER — Other Ambulatory Visit: Payer: Self-pay

## 2017-06-23 ENCOUNTER — Encounter (HOSPITAL_COMMUNITY): Payer: Self-pay | Admitting: *Deleted

## 2017-06-23 ENCOUNTER — Ambulatory Visit (HOSPITAL_COMMUNITY)
Admission: EM | Admit: 2017-06-23 | Discharge: 2017-06-23 | Disposition: A | Discharge status: Home / Self Care | Payer: 59 | Attending: Family Medicine | Admitting: Family Medicine

## 2017-06-23 DIAGNOSIS — G43909 Migraine, unspecified, not intractable, without status migrainosus: Secondary | ICD-10-CM | POA: Insufficient documentation

## 2017-06-23 DIAGNOSIS — M7989 Other specified soft tissue disorders: Secondary | ICD-10-CM | POA: Insufficient documentation

## 2017-06-23 DIAGNOSIS — Z79899 Other long term (current) drug therapy: Secondary | ICD-10-CM | POA: Insufficient documentation

## 2017-06-23 DIAGNOSIS — F3130 Bipolar disorder, current episode depressed, mild or moderate severity, unspecified: Secondary | ICD-10-CM | POA: Diagnosis not present

## 2017-06-23 DIAGNOSIS — Z87891 Personal history of nicotine dependence: Secondary | ICD-10-CM | POA: Insufficient documentation

## 2017-06-23 DIAGNOSIS — E669 Obesity, unspecified: Secondary | ICD-10-CM | POA: Diagnosis not present

## 2017-06-23 DIAGNOSIS — R51 Headache: Secondary | ICD-10-CM | POA: Diagnosis present

## 2017-06-23 DIAGNOSIS — I10 Essential (primary) hypertension: Secondary | ICD-10-CM | POA: Insufficient documentation

## 2017-06-23 HISTORY — DX: Postpartum depression: F53.0

## 2017-06-23 HISTORY — DX: Nontoxic goiter, unspecified: E04.9

## 2017-06-23 HISTORY — DX: Other mental disorders complicating the puerperium: O99.345

## 2017-06-23 MED ORDER — NAPROXEN 500 MG PO TABS: 500.0000 mg | ORAL_TABLET | Freq: Two times a day (BID) | ORAL | 0 refills | Status: DC

## 2017-06-23 MED ORDER — KETOROLAC TROMETHAMINE 60 MG/2ML IM SOLN
INTRAMUSCULAR | Status: AC
  Filled 2017-06-23: qty 2

## 2017-06-23 MED ORDER — METOCLOPRAMIDE HCL 5 MG/ML IJ SOLN
5.0000 mg | Freq: Once | INTRAMUSCULAR | Status: AC
  Administered 2017-06-23: 5 mg via INTRAMUSCULAR

## 2017-06-23 MED ORDER — METOCLOPRAMIDE HCL 5 MG/ML IJ SOLN
INTRAMUSCULAR | Status: AC
  Filled 2017-06-23: qty 2

## 2017-06-23 MED ORDER — DEXAMETHASONE SODIUM PHOSPHATE 10 MG/ML IJ SOLN
10.0000 mg | Freq: Once | INTRAMUSCULAR | Status: AC
  Administered 2017-06-23: 10 mg via INTRAMUSCULAR

## 2017-06-23 MED ORDER — FLUCONAZOLE 150 MG PO TABS: 150.0000 mg | ORAL_TABLET | Freq: Once | ORAL | 0 refills | Status: AC

## 2017-06-23 MED ORDER — DOXYCYCLINE HYCLATE 100 MG PO CAPS: 100.0000 mg | ORAL_CAPSULE | Freq: Two times a day (BID) | ORAL | 0 refills | Status: AC

## 2017-06-23 MED ORDER — DEXAMETHASONE SODIUM PHOSPHATE 10 MG/ML IJ SOLN
INTRAMUSCULAR | Status: AC
  Filled 2017-06-23: qty 1

## 2017-06-23 MED ORDER — KETOROLAC TROMETHAMINE 60 MG/2ML IM SOLN
60.0000 mg | Freq: Once | INTRAMUSCULAR | Status: AC
  Administered 2017-06-23: 60 mg via INTRAMUSCULAR

## 2017-06-23 NOTE — ED Triage Notes (Signed)
Started with HA last week; this week was also having BLE edema.  2 days ago started with "red splotches" on BLE.  Noted home BP was 145/101 last week; this week was 130/96.  No personal hx HTN.

## 2017-06-23 NOTE — Discharge Instructions (Signed)
For your leg swelling recommend compression stockings occasionally throughout the day as well as elevation.  You may take Tylenol or ibuprofen to help with swelling and pain.  Please return if swelling continues to worsen or persisting without improvement with above measures.  For your headache we gave you a shot of Toradol, Reglan and Decadron today.  I have sent in Naprosyn for you to use at home.  He may use ibuprofen or ice whichever helps you more.  I am going to start you on a course of doxycycline for possible lyme disease as a cause of your rash and headache.  We will call you with the blood which was positive.  Please return if rash not resolving, or is changing and spreading.
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
## 2017-06-25 LAB — B. BURGDORFI ANTIBODIES: B burgdorferi Ab IgG+IgM: <0.91 {ISR} (ref 0.00–0.90)

## 2017-06-29 ENCOUNTER — Telehealth (HOSPITAL_COMMUNITY): Payer: Self-pay | Admitting: Emergency Medicine

## 2017-06-29 NOTE — Telephone Encounter (Signed)
Pt called about her blood test results. Pt informed her test was negative for lyme disease, pt wondering if she should finish her antibiotic, per hallie PA to finish doxy and if she's still having symptoms to make an appt with her PCP next week. Pt verbalized understanding and had no further questions.

## 2017-09-27 ENCOUNTER — Ambulatory Visit (INDEPENDENT_AMBULATORY_CARE_PROVIDER_SITE_OTHER): Payer: 59 | Admitting: Licensed Clinical Social Worker

## 2017-09-27 ENCOUNTER — Encounter (HOSPITAL_COMMUNITY): Payer: Self-pay | Admitting: Licensed Clinical Social Worker

## 2017-09-27 DIAGNOSIS — O99345 Other mental disorders complicating the puerperium: Secondary | ICD-10-CM

## 2017-09-27 DIAGNOSIS — F53 Postpartum depression: Secondary | ICD-10-CM
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
## 2017-10-01 ENCOUNTER — Encounter (HOSPITAL_COMMUNITY): Payer: Self-pay | Admitting: Psychiatry

## 2017-10-01 ENCOUNTER — Other Ambulatory Visit (HOSPITAL_COMMUNITY): Payer: 59 | Attending: Psychiatry | Admitting: Psychiatry

## 2017-10-01 DIAGNOSIS — F331 Major depressive disorder, recurrent, moderate: Secondary | ICD-10-CM | POA: Insufficient documentation

## 2017-10-01 DIAGNOSIS — F4310 Post-traumatic stress disorder, unspecified: Secondary | ICD-10-CM | POA: Insufficient documentation
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
## 2017-10-02 ENCOUNTER — Other Ambulatory Visit (HOSPITAL_COMMUNITY): Payer: 59 | Admitting: Psychiatry

## 2017-10-02 DIAGNOSIS — F331 Major depressive disorder, recurrent, moderate: Secondary | ICD-10-CM | POA: Diagnosis present

## 2017-10-02 DIAGNOSIS — F4310 Post-traumatic stress disorder, unspecified: Secondary | ICD-10-CM | POA: Diagnosis not present

## 2017-10-02 MED ORDER — HYDROXYZINE PAMOATE 25 MG PO CAPS: 25.0000 mg | ORAL_CAPSULE | Freq: Three times a day (TID) | ORAL | 0 refills | Status: DC | PRN

## 2017-10-02 MED ORDER — QUETIAPINE FUMARATE 25 MG PO TABS: 25.0000 mg | ORAL_TABLET | Freq: Two times a day (BID) | ORAL | 2 refills | Status: DC

## 2017-10-02 NOTE — Progress Notes (Signed)
    Daily Group Progress Note  Program: IOP  Group Time: 9:00-12:00  Participation Level: Active  Behavioral Response: Appropriate  Type of Therapy:  Group Therapy  Summary of Progress: Pt. Presented as talkative, engaged in the group process. Pt. Completed intake assessment with the case manager and the nurse practitioner. Pt. Participated in discussion about bibliotherapy (I.e., Co-Dependent No More, Five Love Languages, and Getting the Love that You Want) to work through relationship issues. Pt. Participated in grief and loss session with the Chaplain.     Nancie Neas, LPC
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
## 2017-10-03 ENCOUNTER — Other Ambulatory Visit (HOSPITAL_COMMUNITY): Payer: 59 | Admitting: Psychiatry

## 2017-10-03 DIAGNOSIS — F331 Major depressive disorder, recurrent, moderate: Secondary | ICD-10-CM

## 2017-10-03 MED ORDER — QUETIAPINE FUMARATE 25 MG PO TABS: 25.0000 mg | ORAL_TABLET | Freq: Every day | ORAL | 2 refills | Status: DC

## 2017-10-04 ENCOUNTER — Other Ambulatory Visit (HOSPITAL_COMMUNITY): Payer: 59 | Admitting: Psychiatry

## 2017-10-04 DIAGNOSIS — F331 Major depressive disorder, recurrent, moderate: Secondary | ICD-10-CM

## 2017-10-04 NOTE — Progress Notes (Signed)
    Daily Group Progress Note  Program: IOP  Group Time: 9:00-12:00  Participation Level: Active  Behavioral Response: Appropriate  Type of Therapy:  Group Therapy  Summary of Progress: Pt. Presented as lethargic, sleepy. Pt. Shared with the group that she felt very sleepy and attributed being sleepy to medication. Pt. Met with nurse to discuss her medication and adjusting because she felt uncomfortable being so sedated. Pt. Discussed need to set healthier boundaries with the father of her youngest child and accepting that their relationship is ending. Pt. Participated in discussion about behaviors to engage in when you begin to feel hopeless about your depression and recognizing situations that you are avoiding that trigger anxiety. Pt. Participated in discussion facilitated by [place].         Nancie Neas, LPC

## 2017-10-05 ENCOUNTER — Other Ambulatory Visit (HOSPITAL_COMMUNITY): Payer: 59 | Admitting: Psychiatry

## 2017-10-05 DIAGNOSIS — F331 Major depressive disorder, recurrent, moderate: Secondary | ICD-10-CM | POA: Diagnosis not present

## 2017-10-05 MED ORDER — ARIPIPRAZOLE 5 MG PO TABS: 5.0000 mg | ORAL_TABLET | Freq: Every day | ORAL | 0 refills | Status: DC
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
## 2017-10-06 ENCOUNTER — Encounter (HOSPITAL_COMMUNITY): Payer: Self-pay | Admitting: Family

## 2017-10-08 ENCOUNTER — Other Ambulatory Visit (HOSPITAL_COMMUNITY): Payer: 59 | Attending: Psychiatry

## 2017-10-08 DIAGNOSIS — F53 Postpartum depression: Secondary | ICD-10-CM | POA: Insufficient documentation

## 2017-10-08 DIAGNOSIS — F4310 Post-traumatic stress disorder, unspecified: Secondary | ICD-10-CM | POA: Insufficient documentation

## 2017-10-08 DIAGNOSIS — F331 Major depressive disorder, recurrent, moderate: Secondary | ICD-10-CM | POA: Insufficient documentation

## 2017-10-09 ENCOUNTER — Other Ambulatory Visit (HOSPITAL_COMMUNITY): Payer: 59 | Admitting: Psychiatry

## 2017-10-09 DIAGNOSIS — F53 Postpartum depression: Secondary | ICD-10-CM | POA: Diagnosis not present

## 2017-10-09 DIAGNOSIS — F331 Major depressive disorder, recurrent, moderate: Secondary | ICD-10-CM

## 2017-10-09 DIAGNOSIS — F4310 Post-traumatic stress disorder, unspecified: Secondary | ICD-10-CM | POA: Diagnosis not present

## 2017-10-09 NOTE — Progress Notes (Signed)
    Daily Group Progress Note  Program: IOP  Group Time: 9:00-12:00  Participation Level: Active  Behavioral Response: Appropriate  Type of Therapy:  Group Therapy  Summary of Progress: Pt. Presents as lethargic, very sleepy, flat affect, depressed. Pt. Participated in guided meditation exercise and review of the grounding series with focus on 4-3-8 breathing to manage stress and anxiety. Pt. Discussed her sadness and emotional exhaustion related to being a single mother. Pt. Discussed sadness regarding her past choices with men and shame regarding the lack of financial and emotional support that she receives from her children's fathers. Pt. Also discussed her desire to complete her graduate degree and thoughts that she cannot return to school right now because of finances, work, and time commitment to her family and children. Pt. Received feedback from the group and finding small ways each day to focus on herself and her dreams for herself.     Nancie Neas, LPC

## 2017-10-10 ENCOUNTER — Other Ambulatory Visit (HOSPITAL_COMMUNITY): Payer: 59 | Admitting: Psychiatry

## 2017-10-10 DIAGNOSIS — F331 Major depressive disorder, recurrent, moderate: Secondary | ICD-10-CM | POA: Diagnosis not present

## 2017-10-10 NOTE — Progress Notes (Signed)
    Daily Group Progress Note  Program: IOP  Group Time: 9:00-12:00  Participation Level: Active  Behavioral Response: Appropriate  Type of Therapy:  Group Therapy  Summary of Progress: Pt. Presents as talkative, alert, engaged in the group process. Pt. Participated in discussion about managing work stress and developing healthy relationships at work. Pt. Has developed good insight about distinguishing between her emotions and unhealthy behaviors that she has engaged in in an attempt to express her emotions. Pt. Has good insight about addressing her mental health holistically so that she can better learn to manage her bipolar disorder. Pt. Is also focusing on managing her medications that she reports have been overly sedating for the past few days.     Nancie Neas, LPC

## 2017-10-12 ENCOUNTER — Other Ambulatory Visit (HOSPITAL_COMMUNITY): Payer: 59

## 2017-10-15 ENCOUNTER — Other Ambulatory Visit (HOSPITAL_COMMUNITY): Payer: 59 | Admitting: Psychiatry

## 2017-10-15 DIAGNOSIS — F331 Major depressive disorder, recurrent, moderate: Secondary | ICD-10-CM | POA: Diagnosis not present
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
## 2017-10-16 ENCOUNTER — Other Ambulatory Visit (HOSPITAL_COMMUNITY): Payer: 59 | Admitting: Family

## 2017-10-16 ENCOUNTER — Encounter (HOSPITAL_COMMUNITY): Payer: Self-pay | Admitting: Psychiatry

## 2017-10-16 DIAGNOSIS — F331 Major depressive disorder, recurrent, moderate: Secondary | ICD-10-CM | POA: Diagnosis not present

## 2017-10-16 MED ORDER — ARIPIPRAZOLE 5 MG PO TABS: 5.0000 mg | ORAL_TABLET | Freq: Every day | ORAL | 0 refills | Status: DC

## 2017-10-16 MED ORDER — HYDROXYZINE PAMOATE 25 MG PO CAPS: 50.0000 mg | ORAL_CAPSULE | Freq: Three times a day (TID) | ORAL | 0 refills | Status: DC | PRN

## 2017-10-16 MED ORDER — HYDROXYZINE PAMOATE 25 MG PO CAPS: 25.0000 mg | ORAL_CAPSULE | Freq: Three times a day (TID) | ORAL | 0 refills | Status: DC | PRN

## 2017-10-16 NOTE — Progress Notes (Signed)
    Daily Group Progress Note  Program: IOP  Group Time: 9:00-12:00  Participation Level: Active  Behavioral Response: Appropriate  Type of Therapy:  Group Therapy  Summary of Progress: Pt. Presented as talkative, engaged in the group process. Pt. Is processing her shame related to her relationships with her children's fathers. Pt. Worked with therapist to develop new thoughts about herself related to these relationships. Pt. Is developing awareness related to her lack of self-care and consequence of lack of self-care and giving too much in her relationships has resulted in her becoming angry, resentful, and pattern of isolating from others who are genuinely supportive of her.     Nancie Neas, LPC

## 2017-10-16 NOTE — Progress Notes (Signed)
    Daily Group Progress Note  Program: IOP Type of Therapy:  Group Therapy   Participation Level:  Active   Participation Quality:  Appropriate   Affect:  Appropriate   Cognitive:  Appropriate   Insight:  Appropriate   Engagement in Group:  Engaged   Modes of Intervention:  Problem-solving Monica [name] is a female that presented to Psych IOP. Counselor utilized motivation interviewing skills and group processing as a means for therapeutic intervention. Pt has an hx of depression and anxiety. She reports that she is feeling depressed today and has extreme anxiety. Patient reports feelin overwhelmed. She explained that she is the mother of 4 children and does not have a lot of support from the fathers of her children. She reports that she often feels frustrated with her responsibilities of being a mother.  This first and second hour of the group was focused on "Self-Care". Clients were provided a Radio producer". This assessment tool provided an overview of effective strategies to maintain self-care. After completing the full assessment, Monica [name] was asked to choose one item from each area to actively work to improve.  Counselor challenged patient to consider how self-care if neglected may affect the body, mind, and spirit, leaving a feeling of depletion and out of balance. For this reason, it is important to have self-care strategies that address these parts of ourselves. Counselor elicited feedback from client and other members on how to develop effective self-care strategies. The last hour was an invited guest speaker to discuss the science of aromatherapy and it's benefits to promote health and wellbeing.   Pt is dressed in street clothes, alert, oriented x4 with normal speech and normal motor behavior. Eye contact is fair. Pt's mood is depressed and affect is congruent with mood. Thought process is coherent and relevant. Pt's insight is fair and judgement is fair. There  is no indication Pt is currently responding to internal stimuli or experiencing delusional thought content. Pt was cooperative throughout assessment.  Monica [name], LPC

## 2017-10-17 ENCOUNTER — Other Ambulatory Visit (HOSPITAL_COMMUNITY): Payer: 59 | Admitting: Psychiatry

## 2017-10-17 DIAGNOSIS — F331 Major depressive disorder, recurrent, moderate: Secondary | ICD-10-CM

## 2017-10-17 NOTE — Progress Notes (Signed)
    Daily Group Progress Note  Program: IOP  Group Time: 9:00-12:00  Participation Level: Active  Behavioral Response: Appropriate  Type of Therapy:  Group Therapy  Summary of Progress: Pt. Presents as talkative, alert, engaged in the group process. Pt. Continues to complain of excessive sedation and is working with the NP to adjust her medication. Pt. Discussed fourth of July plans with her family. Pt. Participated in discussion about patterns of co-dependency and how enabling dysfunctional relationships impacts our physical and emotional health.     Nancie Neas, LPC

## 2017-10-17 NOTE — Progress Notes (Signed)
    Daily Group Progress Note  Program: IOP  Group Time:  9:00-12:00  Participation Level: Active  Behavioral Response: Appropriate  Type of Therapy:  Group Therapy  Summary of Progress: Pt. Presented as talkative, alert, and engaged in the group process. Pt. Shared that she had a successful conversation with her child's father and was able to use communication tools learned in group to help facilitate the conversation. Pt. Discussed that she was also able to have a conversation with her mother and set a healthy boundary with her mother. Pt. Shared that she walked for thirty minutes yesterday and it helped with her stress management. Pt. Participated in discussion about the importance of developing routines for stress management and learning how to say "No" and setting effective interpersonal boundaries.   Nancie Neas, LPC

## 2017-10-18 ENCOUNTER — Other Ambulatory Visit (HOSPITAL_COMMUNITY): Payer: 59 | Admitting: Psychiatry

## 2017-10-18 DIAGNOSIS — F331 Major depressive disorder, recurrent, moderate: Secondary | ICD-10-CM | POA: Diagnosis not present

## 2017-10-18 NOTE — Progress Notes (Signed)
    Daily Group Progress Note  Program: IOP  Group Time: 9:00-12:00  Participation Level: Active  Behavioral Response: Appropriate  Type of Therapy:  Group Therapy  Summary of Progress: Pt. Presents as talkative, alert, engaged in the group process. Pt. Discussed her anger and resentment related to being a single mother and having little support. Pt. Participated in discussion about the grounding series for the management of anxiety and the Pt.'s Bill of Rights to assist with assertion and healthy relationship boundaries.   Nancie Neas, LPC

## 2017-10-19 ENCOUNTER — Other Ambulatory Visit (HOSPITAL_COMMUNITY): Payer: 59 | Admitting: Psychiatry

## 2017-10-19 DIAGNOSIS — F331 Major depressive disorder, recurrent, moderate: Secondary | ICD-10-CM

## 2017-10-19 NOTE — Progress Notes (Signed)
  [facility]ensive Outpatient Program Discharge Summary  JACCI RUBERG [id number]  Admission date: 09/27/2017 Discharge date: 10/19/2017  Reason for admission: Depression   Per assessment note: Maisy Newport 37 year old African-American female presents with an worsening mood irritability/fluctuation, depress ion and anxiety symptoms .  Reports previous diagnosis of Bipolar, postpartum depressions, PTSD  and anxiety.  Reports she was prescribed Paxil by her OB/GYN however has not taken medication for the past 8 months to 1 year.  She feels like she needs to be restarted on medication due to her intermittent mood swings.  Patient reports she is employed by [employer] where she feels that her employer  is not supportive and has added increased stress.  Chemical Use History: was denied   Family of Origin Issues: Nazifa Trinka continues to ruminate with stress related to her children's father.  Rates most of her anxiety is related to financial and that she is having a difficult time managing her children on her own,  as she reports she cares for 4 children under 41 years old. Patient has plans to reach out to family members for additional support. Heena reports she will continue working on setting boundaries with her children fathers and family.   Progress in Program Toward Treatment Goals: Aston attended and participated with daily group session during intensive outpatient program.  Reports she felt the program was beneficial to help with feelings and coping skills related to depression and stress.  Patient was initiated on Seroquel 25 mg , Vistaril 25 mg for insomnia, anxiety and mood stability. However patient reports the Seroquel was too sedating and she has children to care for.  Patient was then initiated on Abilify 5 mg p.o. daily which she reports taking and tolerating medications well.  NP increase in Vistaril 25 mg to 50 mg for increased anxiety.   Continues to deny homicidal or suicidal ideation during this assessment.  Progress (rationale): ongoing. Patient to follow-up with Charolotte Eke, [facility], psychiatrist Arfeen  Medications refilled at discharge Vistaril 50 mg p.o. as needed 3 times daily, Abilify 5 mg p.o. daily  Take all medications as prescribed. Keep all follow-up appointments as scheduled.  Do not consume alcohol or use illegal drugs while on prescription medications. Report any adverse effects from your medications to your primary care provider promptly.  In the event of recurrent symptoms or worsening symptoms, call 911, a crisis hotline, or go to the nearest emergency department for evaluation.   Derrill Center, NP 10/19/2017

## 2017-10-19 NOTE — Progress Notes (Addendum)
   Daily Group Progress Note  Program: IOP  Group Time: 9 am-12:00 pm  Participation Level: Active  Behavioral Response: Sharing  Type of Therapy:  Group Therapy  Summary of Progress: Pt has an hx of depression. Today's presented subject was "Fears We Face" and reports that she is fearful of being a single mother. She reports wanting to be the best mother she can be for her children. She shared that she doesn't have much support from the fathers of her 4 children. Also, she struggles with catering to her children's disabilities. She states that 3 of her 4 children suffer from various disorders and disabilities.   Counselor provided education to patient on the subject of "Fear" which is one of the most powerful emotions. It has a very strong effect on your mind and body. Counselor provided examples that fear can create strong signals of response when we're in emergencies - for instance, if we are caught in a fire or are being attacked. It can also take effect when you're faced with non-dangerous events, like exams, public speaking, a new job, a date, or even a party. It's a natural response to a threat that can be either perceived or real. Counselor further educated patient that anxiety is a word we use for some types of fear that are usually to do with the thought of a threat or something going wrong in the future, rather than right now. Fear and anxiety can last for a short time and then pass, but they can also last much longer and you can get stuck with them. In some cases they can take over your life, affecting your ability to eat, sleep, concentrate, travel, enjoy life, or even leave the house or go to work or school. This can hold you back from doing things you want or need to do, and it also affects your health. Fryda agreed that sometimes she becomes overwhelmed by fear and wants to avoid situations that might make her frightened or anxious. She reported that it can be hard to break this  cycle, but there are lots of ways to do it. Counselor informed Zooey that she can learn to feel less fearful and to cope with fear so that it doesn't stop you from living. Counselor challenged Belenda Cruise to consider what makes her afraid. Counselor further encouraged Yariela to discover ways in which her fears such as being a single parent. Orissa reported that she could try to spend more time focusing on relaxing. She reported that learning to relax can help her with the mental physical feelings of fear. Or simply just imagining herself in a relaxing place by trying new things such as meditation or yoga. Amri's last day of Psych IOP was today. Counselor spent some time allowing other group members to offer words of encouragement.   Pt is dressed in street clothes, alert, oriented x4 with normal speech and normal motor behavior. Eye contact is fair. Pt's mood is appropriate and affect is congruent with mood. Thought process is coherent and relevant. Pt's insight is fair and judgement is fair. There is no indication Pt is currently not responding to internal stimuli or experiencing delusional thought content. Pt was cooperative throughout assessment.   BH-PIOPB PSYCH
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
## 2017-10-19 NOTE — Patient Instructions (Signed)
D:  Patient successfully completed MH-IOP today.  A:  Discharge today. Follow up with Binnie Rail, LCAS on 11-01-17 @ 5 pm and Dr. Adele Schilder on 12-12-17 @ 11 a.m.  Encouraged support groups.  Return to work on 10-29-17.  R:  Pt receptive.

## 2017-10-22 ENCOUNTER — Other Ambulatory Visit (HOSPITAL_COMMUNITY): Payer: 59

## 2017-10-23 ENCOUNTER — Other Ambulatory Visit (HOSPITAL_COMMUNITY): Payer: 59

## 2017-10-24 ENCOUNTER — Other Ambulatory Visit (HOSPITAL_COMMUNITY): Payer: 59

## 2017-10-25 ENCOUNTER — Other Ambulatory Visit (HOSPITAL_COMMUNITY): Payer: 59

## 2017-10-26 ENCOUNTER — Other Ambulatory Visit (HOSPITAL_COMMUNITY): Payer: 59

## 2017-10-29 ENCOUNTER — Other Ambulatory Visit (HOSPITAL_COMMUNITY): Payer: 59

## 2017-10-29 ENCOUNTER — Other Ambulatory Visit (HOSPITAL_COMMUNITY): Payer: Self-pay | Admitting: Family

## 2017-10-30 ENCOUNTER — Other Ambulatory Visit (HOSPITAL_COMMUNITY): Payer: 59

## 2017-10-31 ENCOUNTER — Other Ambulatory Visit (HOSPITAL_COMMUNITY): Payer: 59

## 2017-11-01 ENCOUNTER — Ambulatory Visit (INDEPENDENT_AMBULATORY_CARE_PROVIDER_SITE_OTHER): Payer: 59 | Admitting: Licensed Clinical Social Worker

## 2017-11-01 ENCOUNTER — Other Ambulatory Visit (HOSPITAL_COMMUNITY): Payer: 59

## 2017-11-01 ENCOUNTER — Encounter (HOSPITAL_COMMUNITY): Payer: Self-pay | Admitting: Licensed Clinical Social Worker

## 2017-11-01 DIAGNOSIS — F331 Major depressive disorder, recurrent, moderate: Secondary | ICD-10-CM | POA: Diagnosis not present

## 2017-11-01 NOTE — Progress Notes (Signed)
   THERAPIST PROGRESS NOTE  Session Time: 5:10-6pm  Participation Level: Active  Behavioral Response: CasualAlertEuthymic  Type of Therapy: Individual Therapy  Treatment Goals addressed:  Improve psychiatric symptoms, Controlled Behavior, Moderated Mood, Improve Unhelpful Thought Patterns, Emotional Regulation Skills (Moderate moods, anger management, stress management), Feel and express a full Range of Emotions, Learn about Diagnosis, Healthy Coping Skills, Recall the Traumatic event without being overwhelmed      Interventions: Motivational Interviewing  Summary: Monica [name] is a 37 y.o. female who presents for her first individual appointment since completion of IOP. Spent a considerable amount of time building a trusting therapeutic relationship. She feels her meds are working well and her moods have stabilized. She no longer feels depression as much but more anxiety. Pt shared she is now making her emotional and mental health a priority. She is now back at work at it has been a smooth transition until today when she had an exchange of words with a stand in Librarian, academic. She used STOP, THINK, ACT which assisted in her maintaining her composure. Pt has 4 children who she cares for alone, all under age of 10. Her parents assist some. Pt has little to no time for self care. Educated pt on the importance of self care. Discussed with pt what coping tools she is using and suggested meditation to pt. Pt was receptive to the suggestion.  Suicidal/Homicidal: Nowithout intent/plan  Therapist Response: Assessed pt's current functioning and reviewed progress. Assisted pt building a trusting therapeutic relationship. Assisted pt processing her current needs for therapy. Assisted pt processing for the management of her stressors.  Plan: Return again in 2 weeks.  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

## 2017-11-02 ENCOUNTER — Other Ambulatory Visit (HOSPITAL_COMMUNITY): Payer: 59

## 2017-11-05 ENCOUNTER — Other Ambulatory Visit (HOSPITAL_COMMUNITY): Payer: 59

## 2017-11-06 ENCOUNTER — Other Ambulatory Visit (HOSPITAL_COMMUNITY): Payer: 59

## 2017-11-07 ENCOUNTER — Other Ambulatory Visit (HOSPITAL_COMMUNITY): Payer: 59

## 2017-11-08 ENCOUNTER — Other Ambulatory Visit (HOSPITAL_COMMUNITY): Payer: 59

## 2017-11-09 ENCOUNTER — Other Ambulatory Visit (HOSPITAL_COMMUNITY): Payer: 59

## 2017-11-12 ENCOUNTER — Encounter (HOSPITAL_COMMUNITY): Payer: Self-pay | Admitting: Licensed Clinical Social Worker

## 2017-11-12 ENCOUNTER — Ambulatory Visit (INDEPENDENT_AMBULATORY_CARE_PROVIDER_SITE_OTHER): Payer: 59 | Admitting: Licensed Clinical Social Worker

## 2017-11-12 DIAGNOSIS — F331 Major depressive disorder, recurrent, moderate: Secondary | ICD-10-CM | POA: Diagnosis not present

## 2017-11-12 NOTE — Progress Notes (Signed)
   THERAPIST PROGRESS NOTE  Session Time: 10:15-11am  Participation Level: Active  Behavioral Response: CasualAlert/Anxious  Type of Therapy: Individual Therapy  Treatment Goals addressed:  Improve psychiatric symptoms, Controlled Behavior, Moderated Mood, Improve Unhelpful Thought Patterns, Emotional Regulation Skills (Moderate moods, anger management, stress management), Feel and express a full Range of Emotions, Learn about Diagnosis, Healthy Coping Skills, Recall the Traumatic event without being overwhelmed      Interventions: Motivational Interviewing  Summary: Monica [name] is a 37 y.o. female who presents for her individual counseling appointment. Pt was visibly upset. She is having problems at work with her new Librarian, academic. He is making it problematic for her leaving to come to her appointments, even though she has FMLA. Asked open ended questions and used empathic reflection. Pt thinks her stability at work has changed since being on FMLA. Because of the instability, she has begun to think of a plan to become self employed. She laid out her plans. Asked lots of open ended questions and asked pt: risks vs benefits. Asked pt hard questions that made her think of her decision and why. Pt has taken on 2 part time jobs. Discussed the feasibility and added stress, and how it affects her full time job. Discussed reasons behind her decision making process. Practiced mindfulness with pt and encouraged her to use mindfulness techniques in her decision making.   Suicidal/Homicidal: Nowithout intent/plan  Therapist Response: Assessed pt's current functioning and reviewed progress. Assisted pt in processing external stress of her job, problems solving, decisiona making, future goals, risks vs benefits, mindfulness techniques. Assisted pt processing her current needs for therapy. Assisted pt processing for the management of her stressors.  Plan: Return again in 2 weeks.  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

## 2017-11-13 ENCOUNTER — Other Ambulatory Visit (HOSPITAL_COMMUNITY): Payer: 59

## 2017-11-14 ENCOUNTER — Other Ambulatory Visit (HOSPITAL_COMMUNITY): Payer: 59

## 2017-11-15 ENCOUNTER — Other Ambulatory Visit (HOSPITAL_COMMUNITY): Payer: 59

## 2017-11-16 ENCOUNTER — Other Ambulatory Visit (HOSPITAL_COMMUNITY): Payer: 59

## 2017-11-18 ENCOUNTER — Other Ambulatory Visit (HOSPITAL_COMMUNITY): Payer: Self-pay | Admitting: Family

## 2017-11-19 ENCOUNTER — Other Ambulatory Visit (HOSPITAL_COMMUNITY): Payer: 59

## 2017-11-20 ENCOUNTER — Other Ambulatory Visit (HOSPITAL_COMMUNITY): Payer: 59

## 2017-11-21 ENCOUNTER — Other Ambulatory Visit (HOSPITAL_COMMUNITY): Payer: 59

## 2017-11-22 ENCOUNTER — Other Ambulatory Visit (HOSPITAL_COMMUNITY): Payer: 59

## 2017-11-23 ENCOUNTER — Other Ambulatory Visit (HOSPITAL_COMMUNITY): Payer: 59

## 2017-11-28 ENCOUNTER — Ambulatory Visit (HOSPITAL_COMMUNITY): Payer: Self-pay | Admitting: Licensed Clinical Social Worker

## 2017-12-11 ENCOUNTER — Ambulatory Visit (HOSPITAL_COMMUNITY): Payer: Self-pay | Admitting: Licensed Clinical Social Worker

## 2017-12-12 ENCOUNTER — Encounter (HOSPITAL_COMMUNITY): Payer: Self-pay | Admitting: Psychiatry

## 2017-12-12 ENCOUNTER — Ambulatory Visit (HOSPITAL_COMMUNITY): Payer: 59 | Admitting: Psychiatry

## 2017-12-12 VITALS — BP 122/70 | HR 80 | Ht 66.0 in | Wt 202.0 lb

## 2017-12-12 DIAGNOSIS — Z79899 Other long term (current) drug therapy: Secondary | ICD-10-CM

## 2017-12-12 DIAGNOSIS — F609 Personality disorder, unspecified: Secondary | ICD-10-CM

## 2017-12-12 DIAGNOSIS — F411 Generalized anxiety disorder: Secondary | ICD-10-CM | POA: Diagnosis not present

## 2017-12-12 DIAGNOSIS — Z8659 Personal history of other mental and behavioral disorders: Secondary | ICD-10-CM

## 2017-12-12 DIAGNOSIS — Z8759 Personal history of other complications of pregnancy, childbirth and the puerperium: Secondary | ICD-10-CM | POA: Diagnosis not present

## 2017-12-12 DIAGNOSIS — F331 Major depressive disorder, recurrent, moderate: Secondary | ICD-10-CM | POA: Diagnosis not present

## 2017-12-12 DIAGNOSIS — F4310 Post-traumatic stress disorder, unspecified: Secondary | ICD-10-CM | POA: Diagnosis not present

## 2017-12-12 MED ORDER — BUSPIRONE HCL 5 MG PO TABS: ORAL_TABLET | ORAL | 2 refills | Status: DC

## 2017-12-12 MED ORDER — ARIPIPRAZOLE 5 MG PO TABS: 5.0000 mg | ORAL_TABLET | Freq: Every day | ORAL | 0 refills | Status: DC
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
## 2017-12-18 ENCOUNTER — Ambulatory Visit (HOSPITAL_COMMUNITY): Payer: Self-pay | Admitting: Licensed Clinical Social Worker

## 2017-12-25 ENCOUNTER — Encounter (HOSPITAL_COMMUNITY): Payer: Self-pay | Admitting: Licensed Clinical Social Worker

## 2017-12-25 ENCOUNTER — Ambulatory Visit (INDEPENDENT_AMBULATORY_CARE_PROVIDER_SITE_OTHER): Payer: 59 | Admitting: Licensed Clinical Social Worker

## 2017-12-25 DIAGNOSIS — F331 Major depressive disorder, recurrent, moderate: Secondary | ICD-10-CM | POA: Diagnosis not present

## 2017-12-25 NOTE — Progress Notes (Signed)
   THERAPIST PROGRESS NOTE  Session Time: 11:10-11:40am  Participation Level: Active  Behavioral Response: CasualAlert/Anxious  Type of Therapy: Individual Therapy  Treatment Goals addressed:  Improve psychiatric symptoms, Controlled Behavior, Moderated Mood, Improve Unhelpful Thought Patterns, Emotional Regulation Skills (Moderate moods, anger management, stress management), Feel and express a full Range of Emotions, Learn about Diagnosis, Healthy Coping Skills, Recall the Traumatic event without being overwhelmed      Interventions: Motivational Interviewing  Summary: FARRYN LINARES is a 37 y.o. female who presents for her individual counseling appointment. Pt discussed her psychiatric symptoms and current life events. Pt saw Dr. Leverne Humbles, psychiatrist, filling in for her psychiatrist who is on medical leave. She put pt on Buspar which pt feels is working well. Pt has re-started graduate school online. She is 25% complete. She has made a schedule to complete her school and school work which should not interfere with her job or caring for her 4 children. Pt discussed her anxiety has increased and her depressive symptoms have decreased. Discussed the importance of self care while taking on new responsibilities. Discussed options of self care for pt. Suggested to pt to continue to use the mindfulness techniques.     Suicidal/Homicidal: Nowithout intent/plan  Therapist Response: Assessed pt's current functioning and reviewed progress. Assisted pt in processing  New school schedule, self care importance mindfulness techniques. Assisted pt processing her current needs for therapy. Assisted pt processing for the management of her stressors.  Plan: Return again in 2 weeks.  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

## 2018-01-02 ENCOUNTER — Ambulatory Visit (HOSPITAL_COMMUNITY): Payer: Self-pay | Admitting: Psychiatry

## 2018-01-09 ENCOUNTER — Telehealth (HOSPITAL_COMMUNITY): Payer: Self-pay

## 2018-01-09 ENCOUNTER — Encounter (HOSPITAL_COMMUNITY): Payer: Self-pay | Admitting: Licensed Clinical Social Worker

## 2018-01-09 ENCOUNTER — Ambulatory Visit (INDEPENDENT_AMBULATORY_CARE_PROVIDER_SITE_OTHER): Payer: 59 | Admitting: Licensed Clinical Social Worker

## 2018-01-09 DIAGNOSIS — F331 Major depressive disorder, recurrent, moderate: Secondary | ICD-10-CM

## 2018-01-09 NOTE — Telephone Encounter (Signed)
Patient stopped taking her Abilify 5mg  on 12-25-17 because she was "feeling better", now that's not the case. Patient has notice she is more moody, tearful, irritable She wants to start back taking the Abilify is it safe?  Please advise?

## 2018-01-09 NOTE — Progress Notes (Signed)
   THERAPIST PROGRESS NOTE  Session Time: 3:40-4:30pm  Participation Level: Active  Behavioral Response: CasualAlert/Anxious  Type of Therapy: Individual Therapy  Treatment Goals addressed:  Improve psychiatric symptoms, Controlled Behavior, Moderated Mood, Improve Unhelpful Thought Patterns, Emotional Regulation Skills (Moderate moods, anger management, stress management), Feel and express a full Range of Emotions, Learn about Diagnosis, Healthy Coping Skills.      Interventions: Motivational Interviewing  Summary: Monica [name] is a 37 y.o. female who presents for her individual counseling appointment. Pt discussed her psychiatric symptoms and current life events. Pt presented anxious today. She has started on-line graduate school in addition to her full time job and mothering 4 young children with limited support. Assisted pt with making goals in sustainable increments so that she can see progress without disappointment. Pt discussed the disappointments in her life surrounding the fathers of her children. Asked open ended questions and used empathic reflection. Discussed work life balance with pt and the importance of making herself a priority so that she will have the sustainability to continue with her overwhelming live. Reinforced with pt the importance of having time for her personal self care. Pt had stopped one of her medications and is now her moods are not stabilized. Had pt see the CMA to discuss her medications at end of session.      Suicidal/Homicidal: Nowithout intent/plan  Therapist Response: Assessed pt's current functioning and reviewed progress. Assisted pt in processing feeling overwhelmed, sustainable increments for goal setting, disappointments, importance of self care. Assisted pt processing for the management of her stressors.  Plan: Return again in 2 weeks.  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

## 2018-01-10 ENCOUNTER — Other Ambulatory Visit (HOSPITAL_COMMUNITY): Payer: Self-pay | Admitting: Psychiatry

## 2018-01-10 NOTE — Telephone Encounter (Signed)
I called the patient and lvm letting her know what Dr. Adele Schilder said

## 2018-01-10 NOTE — Telephone Encounter (Signed)
She should not be stopping the Abilify without consulting physician.  If she has any concern about the Abilify then she should discuss with physician.  She had appointment with Dr. Doyne Keel on October 31.  She should keep that appointment.

## 2018-01-23 ENCOUNTER — Ambulatory Visit (INDEPENDENT_AMBULATORY_CARE_PROVIDER_SITE_OTHER): Payer: 59 | Admitting: Licensed Clinical Social Worker

## 2018-01-23 ENCOUNTER — Other Ambulatory Visit (HOSPITAL_COMMUNITY): Payer: Self-pay

## 2018-01-23 ENCOUNTER — Encounter (HOSPITAL_COMMUNITY): Payer: Self-pay | Admitting: Licensed Clinical Social Worker

## 2018-01-23 DIAGNOSIS — F331 Major depressive disorder, recurrent, moderate: Secondary | ICD-10-CM | POA: Diagnosis not present

## 2018-01-23 MED ORDER — ARIPIPRAZOLE 5 MG PO TABS: 5.0000 mg | ORAL_TABLET | Freq: Every day | ORAL | 0 refills | Status: DC

## 2018-01-23 MED ORDER — BUSPIRONE HCL 5 MG PO TABS: ORAL_TABLET | ORAL | 0 refills | Status: DC
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
## 2018-02-06 ENCOUNTER — Ambulatory Visit (HOSPITAL_COMMUNITY): Payer: Self-pay | Admitting: Licensed Clinical Social Worker

## 2018-02-07 ENCOUNTER — Ambulatory Visit (HOSPITAL_COMMUNITY): Payer: 59 | Admitting: Psychiatry

## 2018-02-07 ENCOUNTER — Encounter (HOSPITAL_COMMUNITY): Payer: Self-pay | Admitting: Psychiatry

## 2018-02-07 VITALS — BP 116/68 | Ht 66.5 in | Wt 207.0 lb

## 2018-02-07 DIAGNOSIS — F411 Generalized anxiety disorder: Secondary | ICD-10-CM | POA: Diagnosis not present

## 2018-02-07 DIAGNOSIS — F331 Major depressive disorder, recurrent, moderate: Secondary | ICD-10-CM

## 2018-02-07 DIAGNOSIS — F4310 Post-traumatic stress disorder, unspecified: Secondary | ICD-10-CM | POA: Diagnosis not present

## 2018-02-07 MED ORDER — CLONIDINE HCL 0.1 MG PO TABS: 0.1000 mg | ORAL_TABLET | Freq: Two times a day (BID) | ORAL | 1 refills | Status: DC

## 2018-02-07 MED ORDER — ARIPIPRAZOLE 5 MG PO TABS: 5.0000 mg | ORAL_TABLET | Freq: Every day | ORAL | 1 refills | Status: DC

## 2018-02-07 MED ORDER — BUSPIRONE HCL 5 MG PO TABS: 5.0000 mg | ORAL_TABLET | Freq: Two times a day (BID) | ORAL | 1 refills | Status: DC
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
## 2018-02-20 ENCOUNTER — Encounter (HOSPITAL_COMMUNITY): Payer: Self-pay | Admitting: Licensed Clinical Social Worker

## 2018-02-20 ENCOUNTER — Telehealth (HOSPITAL_COMMUNITY): Payer: Self-pay

## 2018-02-20 ENCOUNTER — Ambulatory Visit (INDEPENDENT_AMBULATORY_CARE_PROVIDER_SITE_OTHER): Payer: 59 | Admitting: Licensed Clinical Social Worker

## 2018-02-20 DIAGNOSIS — F411 Generalized anxiety disorder: Secondary | ICD-10-CM | POA: Diagnosis not present

## 2018-02-20 DIAGNOSIS — F331 Major depressive disorder, recurrent, moderate: Secondary | ICD-10-CM | POA: Diagnosis not present

## 2018-02-20 NOTE — Telephone Encounter (Signed)
Patient was here to see Beth and she came by my office with a question about her Clonidine. Patient states that she is able to to go to sleep, but it is not a restful sleep and leaves her groggy and tired the next day. She would like to know if there is an alternative to the Clonidine. Please review and advise, thank you
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
## 2018-02-21 NOTE — Telephone Encounter (Signed)
I don't think there are any other sleep aid options that she has not tried. If she can not tolerate the SE then she should stop the medication. Lets get a sleep clinic med recommendation referral

## 2018-02-21 NOTE — Addendum Note (Signed)
Addended by: Jenkins Rouge on: 02/21/2018 11:10 AM   Modules accepted: Level of Service

## 2018-03-06 ENCOUNTER — Ambulatory Visit (HOSPITAL_COMMUNITY): Payer: Self-pay | Admitting: Licensed Clinical Social Worker

## 2018-03-10 ENCOUNTER — Other Ambulatory Visit (HOSPITAL_COMMUNITY): Payer: Self-pay | Admitting: Psychiatry

## 2018-03-10 DIAGNOSIS — F4310 Post-traumatic stress disorder, unspecified: Secondary | ICD-10-CM

## 2018-03-10 DIAGNOSIS — F411 Generalized anxiety disorder: Secondary | ICD-10-CM

## 2018-03-14 IMAGING — US US THYROID
1 series · 13 of 25 positions shown · non-contrast
Comparison: Prior thyroid ultrasound 05/12/2015; prior thyroid
biopsy 06/01/2015

CLINICAL DATA: Goiter. 36-year-old female with multinodular goiter.
Patient underwent biopsy of nodules in the left, right and isthmic
lobes of the thyroid gland on 06/01/2015.

EXAM:
THYROID ULTRASOUND
TECHNIQUE: Ultrasound examination of the thyroid gland and adjacent soft
tissues was performed.

[Series 1: us thyroid · 0.08mm/px · 13 of 84 slices shown]
[im 1/84]
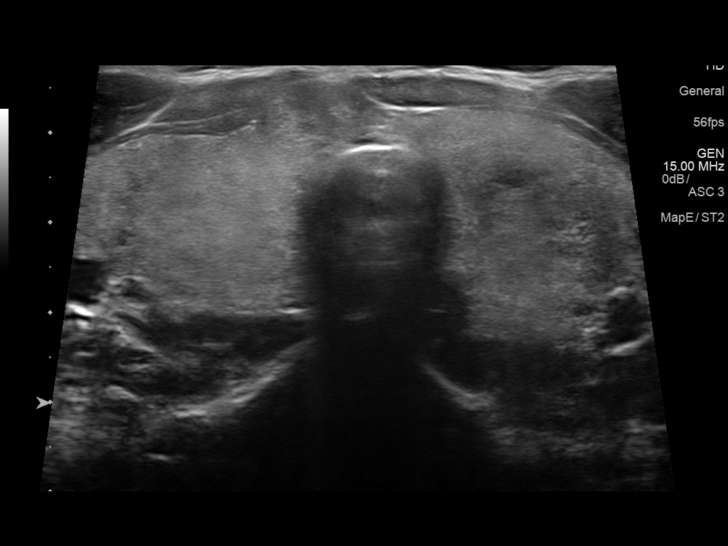
[im 7/84]
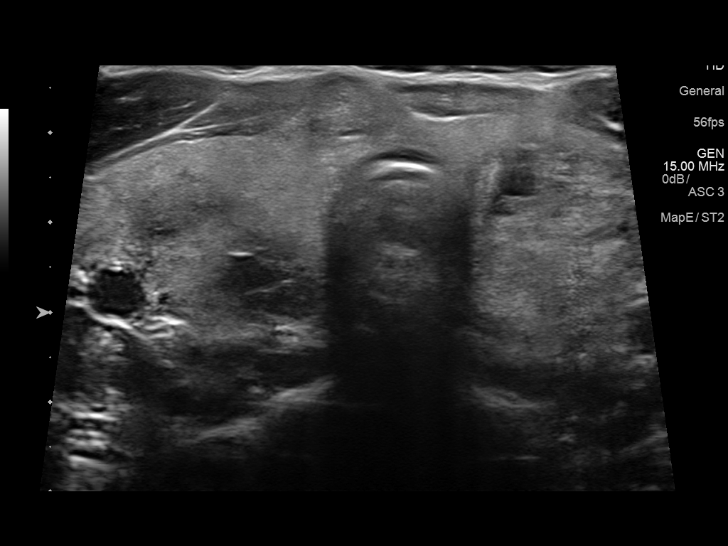
[im 14/84]
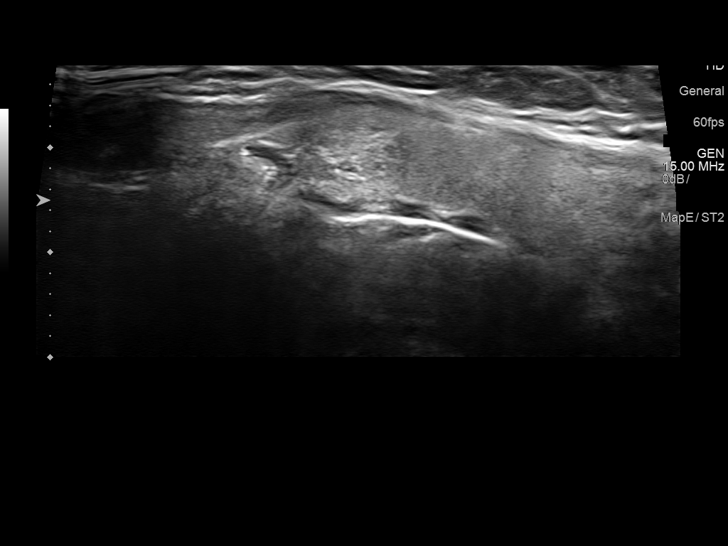
[im 21/84]
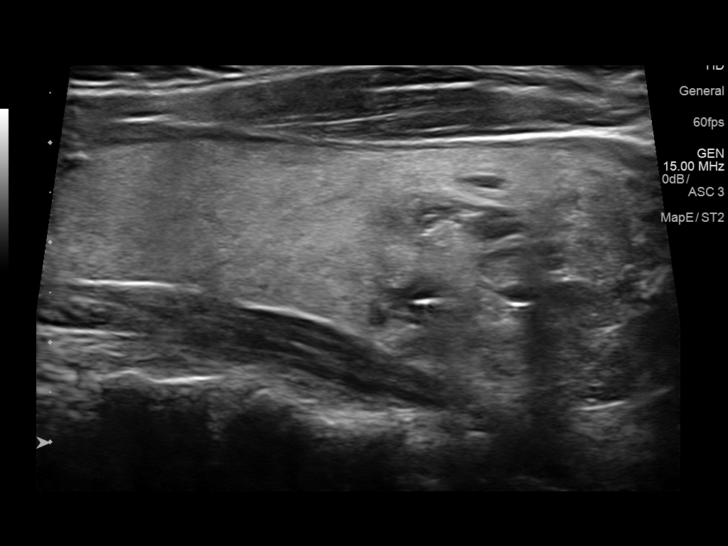
[im 28/84]
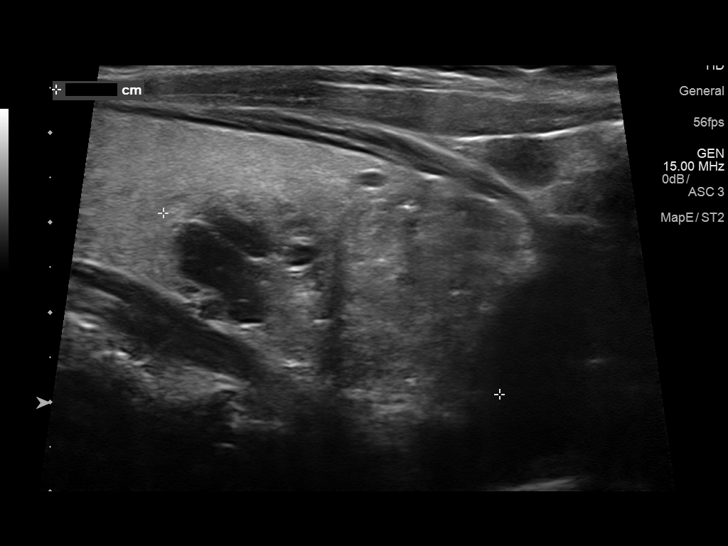
[im 35/84]
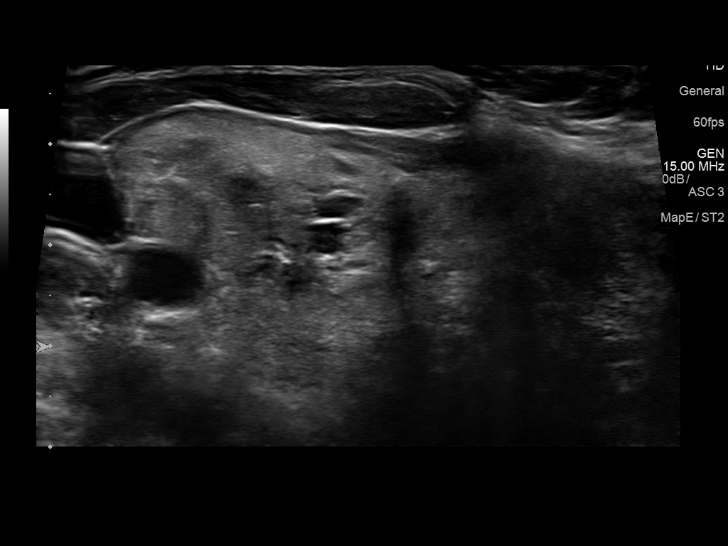
[im 42/84]
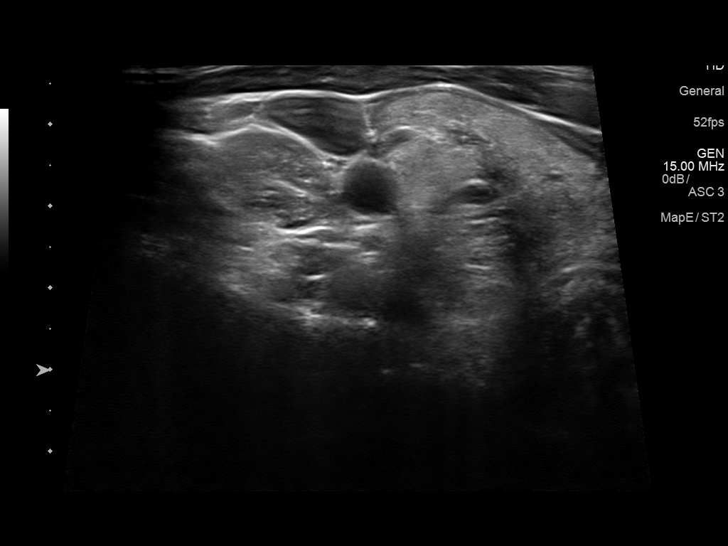
[im 49/84]
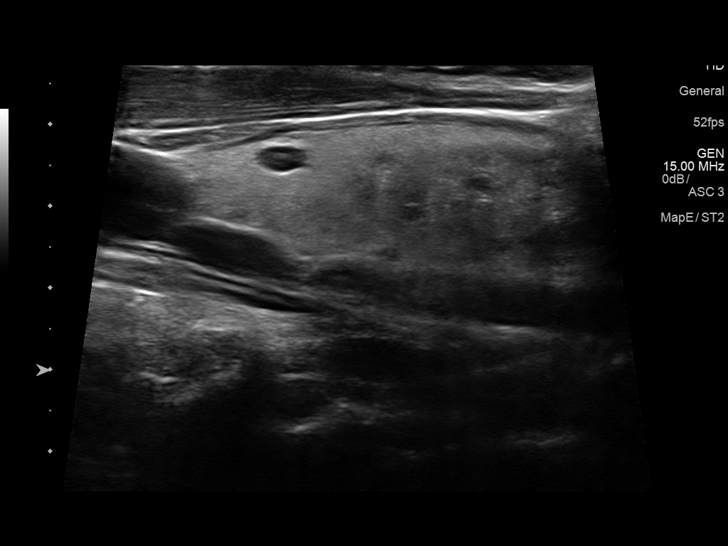
[im 56/84]
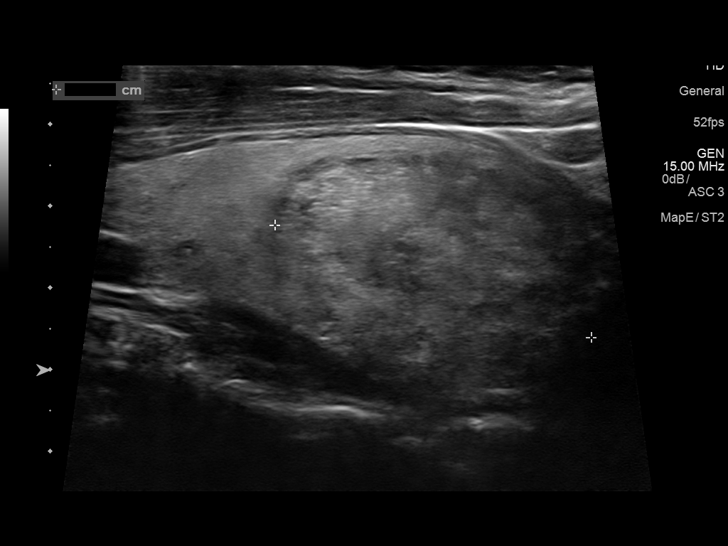
[im 63/84]
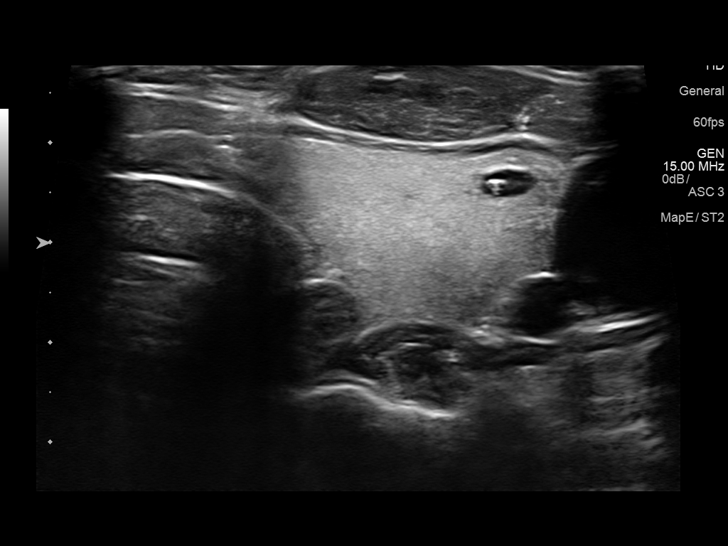
[im 70/84]
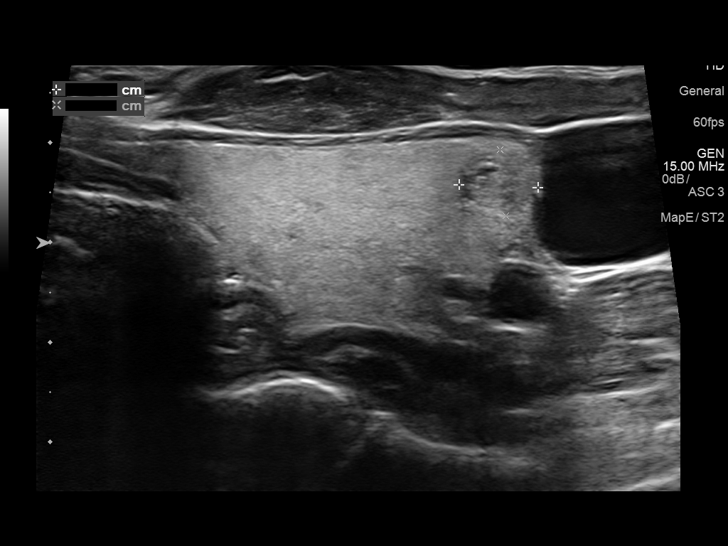
[im 77/84]
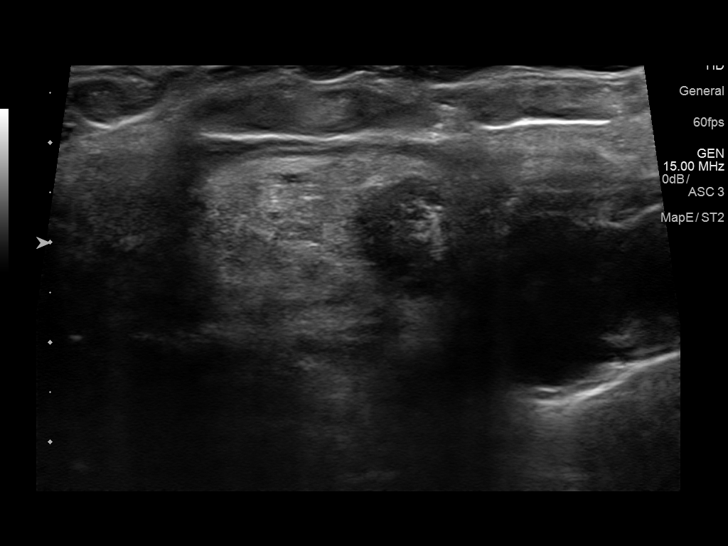
[im 84/84]
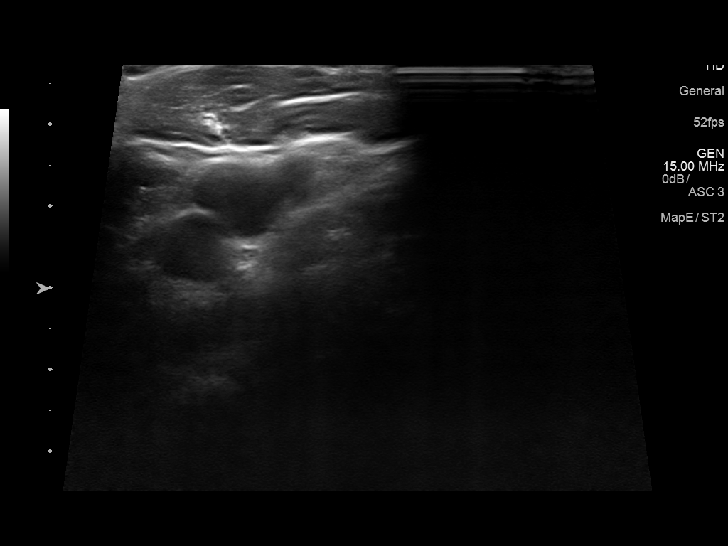

[13 of 25 positions shown; findings below may reference images not displayed]

FINDINGS: Parenchymal Echotexture: Mildly heterogenous

Isthmus: 0.5 cm

Right lobe: 7.9 x 2.3 x 2.9 cm

Left lobe: 6.9 x 3.2 x 3.2 cm

_________________________________________________________

Estimated total number of nodules >/= 1 cm: 3

Number of spongiform nodules >/=  2 cm not described below (TR1): 0

Number of mixed cystic and solid nodules >/= 1.5 cm not described
below (TR2): 0

_________________________________________________________

The previously biopsied nodule in the right inferior gland measures
4.3 x 3.4 x 2.5 cm, perhaps minimally enlarged compared to 4.2 x
x 2.3 cm previously. The previously biopsied nodule in the superior
aspect of the thyroid isthmus is slightly smaller at 1.3 x 1.1 x
cm compared to 1.5 x 1.3 x 0.8 cm previously. The previously
biopsied nodule in the left inferior gland is slightly enlarged at 4
x 3.2 x 2.9 cm compared to 3.6 x 2.9 x 2.4 cm previously.

Additional small (subcentimeter) nodules are noted in the mid aspect
of the left gland in demonstrate no significant interval change.
IMPRESSION: 1. Perhaps slight interval enlargement of the previously biopsied
mass is in the right inferior and left inferior gland. Recommend
correlation with prior biopsy results.
2. Similar to slightly smaller previously biopsied nodule in the
thyroid isthmus.
3. No new thyroid nodules identified.

The above is in keeping with the ACR TI-RADS recommendations - [HOSPITAL] 7318;[DATE].

## 2018-03-20 ENCOUNTER — Ambulatory Visit (HOSPITAL_COMMUNITY): Payer: 59 | Admitting: Licensed Clinical Social Worker

## 2018-03-28 ENCOUNTER — Encounter (HOSPITAL_COMMUNITY): Payer: Self-pay | Admitting: Psychiatry

## 2018-03-28 ENCOUNTER — Ambulatory Visit (INDEPENDENT_AMBULATORY_CARE_PROVIDER_SITE_OTHER): Payer: 59 | Admitting: Psychiatry

## 2018-03-28 VITALS — BP 147/91 | HR 65 | Ht 66.5 in | Wt 202.0 lb

## 2018-03-28 DIAGNOSIS — F331 Major depressive disorder, recurrent, moderate: Secondary | ICD-10-CM | POA: Diagnosis not present

## 2018-03-28 DIAGNOSIS — F411 Generalized anxiety disorder: Secondary | ICD-10-CM | POA: Diagnosis not present

## 2018-03-28 DIAGNOSIS — F4310 Post-traumatic stress disorder, unspecified: Secondary | ICD-10-CM | POA: Diagnosis not present

## 2018-03-28 MED ORDER — BUSPIRONE HCL 5 MG PO TABS: 5.0000 mg | ORAL_TABLET | Freq: Two times a day (BID) | ORAL | 2 refills | Status: DC

## 2018-03-28 MED ORDER — ARIPIPRAZOLE 5 MG PO TABS: 5.0000 mg | ORAL_TABLET | Freq: Every day | ORAL | 2 refills | Status: DC

## 2018-03-28 MED ORDER — CLONIDINE HCL 0.1 MG PO TABS: 0.1000 mg | ORAL_TABLET | Freq: Two times a day (BID) | ORAL | 2 refills | Status: DC
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
## 2018-03-29 ENCOUNTER — Other Ambulatory Visit (HOSPITAL_COMMUNITY): Payer: Self-pay

## 2018-03-29 ENCOUNTER — Telehealth (HOSPITAL_COMMUNITY): Payer: Self-pay

## 2018-03-29 DIAGNOSIS — F331 Major depressive disorder, recurrent, moderate: Secondary | ICD-10-CM

## 2018-03-29 DIAGNOSIS — F4310 Post-traumatic stress disorder, unspecified: Secondary | ICD-10-CM

## 2018-03-29 DIAGNOSIS — F411 Generalized anxiety disorder: Secondary | ICD-10-CM

## 2018-03-29 MED ORDER — ARIPIPRAZOLE 5 MG PO TABS: 5.0000 mg | ORAL_TABLET | Freq: Every day | ORAL | 0 refills | Status: DC

## 2018-03-29 NOTE — Telephone Encounter (Addendum)
Received fax request from pharmacy. Patient is requesting a 90 day prescription of Aripiprazole 5mg  tabs for insurance to pay

## 2018-04-01 NOTE — Telephone Encounter (Signed)
Prescription that was in the computer for Aripiprazole 5mg  1 tab per day, 30 tabs with 2 refills. Resent prescription to 90 days with no refill

## 2018-04-17 ENCOUNTER — Ambulatory Visit (INDEPENDENT_AMBULATORY_CARE_PROVIDER_SITE_OTHER): Payer: 59 | Admitting: Licensed Clinical Social Worker

## 2018-04-17 DIAGNOSIS — F4310 Post-traumatic stress disorder, unspecified: Secondary | ICD-10-CM | POA: Diagnosis not present

## 2018-04-17 DIAGNOSIS — F411 Generalized anxiety disorder: Secondary | ICD-10-CM

## 2018-04-18 ENCOUNTER — Encounter (HOSPITAL_COMMUNITY): Payer: Self-pay | Admitting: Licensed Clinical Social Worker
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
## 2018-05-01 ENCOUNTER — Ambulatory Visit (HOSPITAL_COMMUNITY): Payer: 59 | Admitting: Licensed Clinical Social Worker

## 2018-05-01 ENCOUNTER — Other Ambulatory Visit (HOSPITAL_COMMUNITY): Payer: Self-pay | Admitting: Psychiatry

## 2018-05-01 DIAGNOSIS — F411 Generalized anxiety disorder: Secondary | ICD-10-CM

## 2018-05-01 DIAGNOSIS — F4310 Post-traumatic stress disorder, unspecified: Secondary | ICD-10-CM

## 2018-05-30 ENCOUNTER — Ambulatory Visit (HOSPITAL_COMMUNITY): Payer: 59 | Admitting: Psychiatry

## 2018-06-13 ENCOUNTER — Other Ambulatory Visit: Payer: Self-pay

## 2018-06-13 ENCOUNTER — Encounter (HOSPITAL_COMMUNITY): Payer: Self-pay | Admitting: Psychiatry

## 2018-06-13 ENCOUNTER — Ambulatory Visit (INDEPENDENT_AMBULATORY_CARE_PROVIDER_SITE_OTHER): Payer: 59 | Admitting: Psychiatry

## 2018-06-13 VITALS — BP 135/85 | HR 93 | Resp 16 | Wt 203.2 lb

## 2018-06-13 DIAGNOSIS — F411 Generalized anxiety disorder: Secondary | ICD-10-CM

## 2018-06-13 DIAGNOSIS — F4310 Post-traumatic stress disorder, unspecified: Secondary | ICD-10-CM | POA: Diagnosis not present

## 2018-06-13 DIAGNOSIS — F331 Major depressive disorder, recurrent, moderate: Secondary | ICD-10-CM | POA: Diagnosis not present

## 2018-06-13 MED ORDER — CLONIDINE HCL 0.1 MG PO TABS: 0.1000 mg | ORAL_TABLET | Freq: Two times a day (BID) | ORAL | 2 refills | Status: DC

## 2018-06-13 MED ORDER — ARIPIPRAZOLE 5 MG PO TABS: 5.0000 mg | ORAL_TABLET | Freq: Every day | ORAL | 0 refills | Status: AC

## 2018-06-13 MED ORDER — BUSPIRONE HCL 5 MG PO TABS: 5.0000 mg | ORAL_TABLET | Freq: Two times a day (BID) | ORAL | 0 refills | Status: AC
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
## 2018-06-24 ENCOUNTER — Encounter (HOSPITAL_COMMUNITY): Payer: Self-pay | Admitting: Licensed Clinical Social Worker

## 2018-06-24 ENCOUNTER — Ambulatory Visit (INDEPENDENT_AMBULATORY_CARE_PROVIDER_SITE_OTHER): Payer: 59 | Admitting: Licensed Clinical Social Worker

## 2018-06-24 ENCOUNTER — Other Ambulatory Visit: Payer: Self-pay

## 2018-06-24 DIAGNOSIS — F411 Generalized anxiety disorder: Secondary | ICD-10-CM | POA: Diagnosis not present

## 2018-06-24 DIAGNOSIS — F4310 Post-traumatic stress disorder, unspecified: Secondary | ICD-10-CM | POA: Diagnosis not present

## 2018-06-24 DIAGNOSIS — F331 Major depressive disorder, recurrent, moderate: Secondary | ICD-10-CM | POA: Diagnosis not present
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
## 2018-07-08 ENCOUNTER — Other Ambulatory Visit: Payer: Self-pay

## 2018-07-08 ENCOUNTER — Ambulatory Visit (INDEPENDENT_AMBULATORY_CARE_PROVIDER_SITE_OTHER): Payer: 59 | Admitting: Licensed Clinical Social Worker

## 2018-07-08 DIAGNOSIS — F411 Generalized anxiety disorder: Secondary | ICD-10-CM | POA: Diagnosis not present

## 2018-07-08 DIAGNOSIS — F331 Major depressive disorder, recurrent, moderate: Secondary | ICD-10-CM | POA: Diagnosis not present

## 2018-07-10 ENCOUNTER — Encounter (HOSPITAL_COMMUNITY): Payer: Self-pay | Admitting: Licensed Clinical Social Worker

## 2018-07-10 NOTE — Progress Notes (Addendum)
Virtual Visit via Video Note  I connected with Monica [name] on 07/08/2018 at  2:00 PM EDT by a video enabled telemedicine application and verified that I am speaking with the correct person using two identifiers.   I discussed the limitations of evaluation and management by telemedicine and the availability of in person appointments. The patient expressed understanding and agreed to proceed.  History of Present Illness: Pt was referred to OP therapyby Dr. Adele Schilder for depression    Observations/Objective: Pt presented irratble, overwhelmed and anxious for webex therapy session. 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 Objective: reduce irritability and use coping skills to deal with anxiety. Pt decided to not begin an exploration of her childhood at this time because all 4 of her small children were in the same room for safety purposes.                        Assessment and Plan: Reviewed tx plan with pt.  Emailed pt resources for families quarantined at home. Emailed coping tools for anxiety during this time of quarantine. Reviewed resources with pt.   Follow Up Instructions:    I discussed the assessment and treatment plan with the patient. The patient was provided an opportunity to ask questions and all were answered. The patient agreed with the plan and demonstrated an understanding of the instructions.   The patient was advised to call back or seek an in-person evaluation if the symptoms worsen or if the condition fails to improve as anticipated.  I provided 50 minutes of non-face-to-face time during this encounter.   Monica [name],Monica [name], LCAS

## 2018-07-11 ENCOUNTER — Other Ambulatory Visit: Payer: Self-pay | Admitting: Endocrinology

## 2018-07-11 DIAGNOSIS — E049 Nontoxic goiter, unspecified: Secondary | ICD-10-CM

## 2018-07-22 ENCOUNTER — Ambulatory Visit (HOSPITAL_COMMUNITY): Payer: 59 | Admitting: Licensed Clinical Social Worker

## 2018-07-22 ENCOUNTER — Other Ambulatory Visit: Payer: Self-pay

## 2018-07-24 ENCOUNTER — Other Ambulatory Visit: Payer: Self-pay

## 2018-07-24 ENCOUNTER — Ambulatory Visit
Admission: RE | Admit: 2018-07-24 | Discharge: 2018-07-24 | Disposition: A | Discharge status: Home / Self Care | Payer: 59 | Source: Ambulatory Visit | Attending: Endocrinology | Admitting: Endocrinology

## 2018-07-24 DIAGNOSIS — E049 Nontoxic goiter, unspecified: Secondary | ICD-10-CM

## 2018-07-29 ENCOUNTER — Other Ambulatory Visit: Payer: Self-pay | Admitting: Endocrinology

## 2018-07-29 DIAGNOSIS — E049 Nontoxic goiter, unspecified: Secondary | ICD-10-CM

## 2018-08-05 ENCOUNTER — Ambulatory Visit (INDEPENDENT_AMBULATORY_CARE_PROVIDER_SITE_OTHER): Payer: 59 | Admitting: Licensed Clinical Social Worker

## 2018-08-05 ENCOUNTER — Encounter (HOSPITAL_COMMUNITY): Payer: Self-pay | Admitting: Licensed Clinical Social Worker

## 2018-08-05 ENCOUNTER — Other Ambulatory Visit: Payer: Self-pay

## 2018-08-05 DIAGNOSIS — F411 Generalized anxiety disorder: Secondary | ICD-10-CM

## 2018-08-05 DIAGNOSIS — F331 Major depressive disorder, recurrent, moderate: Secondary | ICD-10-CM | POA: Diagnosis not present

## 2018-08-05 NOTE — Progress Notes (Signed)
Virtual Visit via Video Note  I connected with Monica [name] on 07/08/2018 at  3:00 PM EDT by a video enabled telemedicine application and verified that I am speaking with the correct person using two identifiers.   I discussed the limitations of evaluation and management by telemedicine and the availability of in person appointments. The patient expressed understanding and agreed to proceed.  History of Present Illness: Pt was referred to OP therapyby Dr. Adele Schilder for depression    Observations/Objective: Pt presented irratble, overwhelmed and anxious for webex therapy session. Pt discussed her psychiatric symptoms and current life events. "I'm so ready for the pandemic to be over. I'm tired of being home with my 4 kids all the time. I'm having negative thoughts all the time." Emailed pt worksheet socratic questioning. Reviewed worksheet with pt and gave her suggestions on challenging her negative thoughts. Encouraged pt to use self care for herself during this trying time.                    Assessment and Plan:   Follow Up Instructions:    I discussed the assessment and treatment plan with the patient. The patient was provided an opportunity to ask questions and all were answered. The patient agreed with the plan and demonstrated an understanding of the instructions.   The patient was advised to call back or seek an in-person evaluation if the symptoms worsen or if the condition fails to improve as anticipated.  I provided 50 minutes of non-face-to-face time during this encounter.   Avrom Robarts S, LCAS

## 2018-08-12 ENCOUNTER — Other Ambulatory Visit: Payer: Self-pay

## 2018-08-12 ENCOUNTER — Ambulatory Visit (INDEPENDENT_AMBULATORY_CARE_PROVIDER_SITE_OTHER): Payer: 59 | Admitting: Licensed Clinical Social Worker

## 2018-08-12 ENCOUNTER — Encounter (HOSPITAL_COMMUNITY): Payer: Self-pay | Admitting: Licensed Clinical Social Worker

## 2018-08-12 DIAGNOSIS — F331 Major depressive disorder, recurrent, moderate: Secondary | ICD-10-CM

## 2018-08-12 DIAGNOSIS — F411 Generalized anxiety disorder: Secondary | ICD-10-CM | POA: Diagnosis not present
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
## 2018-08-19 ENCOUNTER — Ambulatory Visit (HOSPITAL_COMMUNITY): Payer: 59 | Admitting: Licensed Clinical Social Worker

## 2018-08-29 ENCOUNTER — Ambulatory Visit (HOSPITAL_COMMUNITY): Payer: 59 | Admitting: Licensed Clinical Social Worker

## 2018-08-29 ENCOUNTER — Other Ambulatory Visit: Payer: Self-pay

## 2018-09-01 ENCOUNTER — Other Ambulatory Visit (HOSPITAL_COMMUNITY): Payer: Self-pay | Admitting: Psychiatry

## 2018-09-01 DIAGNOSIS — F411 Generalized anxiety disorder: Secondary | ICD-10-CM

## 2018-09-09 ENCOUNTER — Other Ambulatory Visit: Payer: Self-pay

## 2018-09-09 ENCOUNTER — Ambulatory Visit (HOSPITAL_COMMUNITY): Payer: 59 | Admitting: Licensed Clinical Social Worker

## 2018-09-10 ENCOUNTER — Other Ambulatory Visit (HOSPITAL_COMMUNITY)
Admission: RE | Admit: 2018-09-10 | Discharge: 2018-09-10 | Disposition: A | Discharge status: Home / Self Care | Payer: 59 | Source: Ambulatory Visit | Attending: Student | Admitting: Student

## 2018-09-10 ENCOUNTER — Ambulatory Visit
Admission: RE | Admit: 2018-09-10 | Discharge: 2018-09-10 | Disposition: A | Discharge status: Home / Self Care | Payer: 59 | Source: Ambulatory Visit | Attending: Endocrinology | Admitting: Endocrinology

## 2018-09-10 DIAGNOSIS — E049 Nontoxic goiter, unspecified: Secondary | ICD-10-CM

## 2018-09-12 ENCOUNTER — Ambulatory Visit (HOSPITAL_COMMUNITY): Payer: 59 | Admitting: Psychiatry

## 2018-09-12 ENCOUNTER — Other Ambulatory Visit: Payer: Self-pay

## 2018-10-03 ENCOUNTER — Ambulatory Visit: Payer: 59 | Admitting: Psychology

## 2018-10-07 ENCOUNTER — Encounter (HOSPITAL_COMMUNITY): Payer: Self-pay

## 2018-10-24 ENCOUNTER — Ambulatory Visit: Payer: Self-pay | Admitting: Psychology

## 2018-11-28 ENCOUNTER — Ambulatory Visit: Payer: 59 | Admitting: Psychology

## 2019-07-03 ENCOUNTER — Ambulatory Visit: Payer: 59 | Attending: Internal Medicine

## 2019-07-03 DIAGNOSIS — Z23 Encounter for immunization: Secondary | ICD-10-CM

## 2019-07-03 NOTE — Progress Notes (Signed)
   Covid-19 Vaccination Clinic  Name:  Monica [name]    MRN: [id number] DOB: 1980-10-08  07/03/2019  Ms. [name] was observed post Covid-19 immunization for 15 minutes without incident. She was provided with Vaccine Information Sheet and instruction to access the V-Safe system.   Ms. [name] was instructed to call 911 with any severe reactions post vaccine: Marland Kitchen Difficulty breathing  . Swelling of face and throat  . A fast heartbeat  . A bad rash all over body  . Dizziness and weakness   Immunizations Administered    Name Date Dose VIS Date Route   Pfizer COVID-19 Vaccine 07/03/2019 12:07 PM 0.3 mL 03/21/2019 Intramuscular   Manufacturer: [place]   Lot: [id number]   [place]: [id number]

## 2019-07-25 IMAGING — US US THYROID
1 series · 13 of 25 positions shown · non-contrast
Comparison: 03/13/2017, ultrasound biopsy 06/01/2015

CLINICAL DATA: 37-year-old female with follow-up of thyroid nodules

EXAM:
THYROID ULTRASOUND
TECHNIQUE: Ultrasound examination of the thyroid gland and adjacent soft
tissues was performed.

[Series 1: us thyroid · 0.06mm/px · 82 acquisitions, 13 frames shown]
[im 1/82]
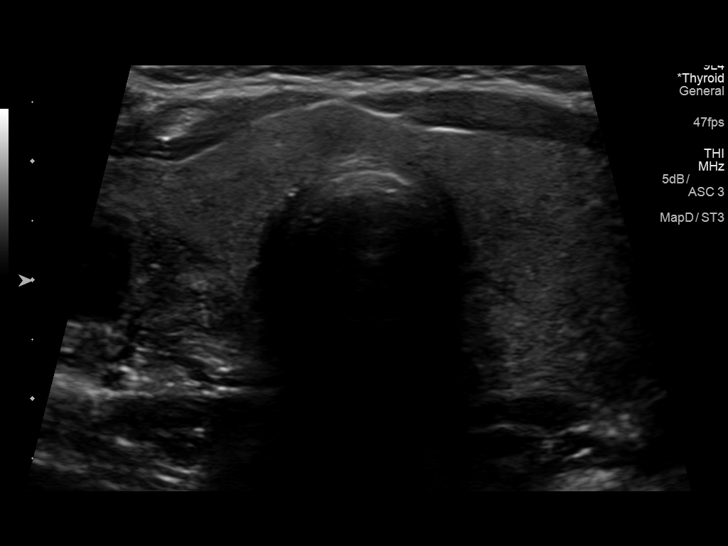
[im 7/82]
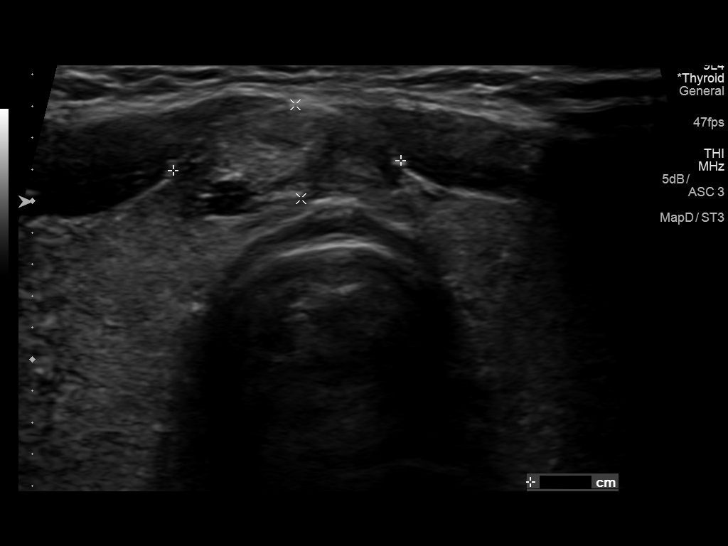
[im 14/82]
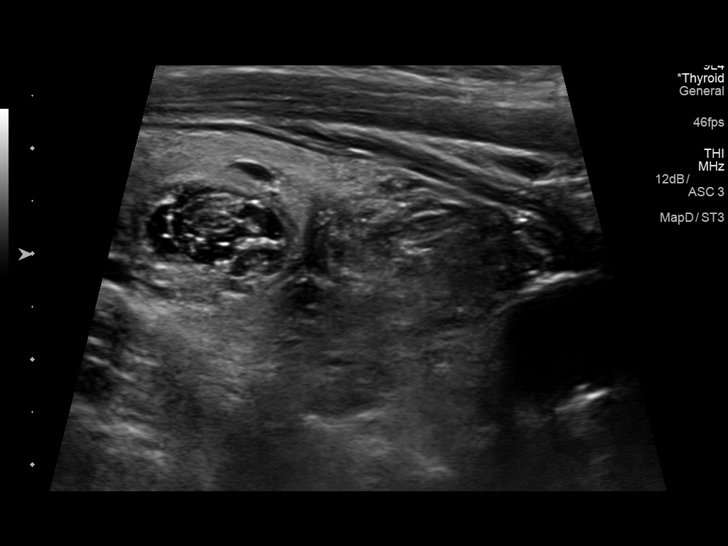
[im 21/82]
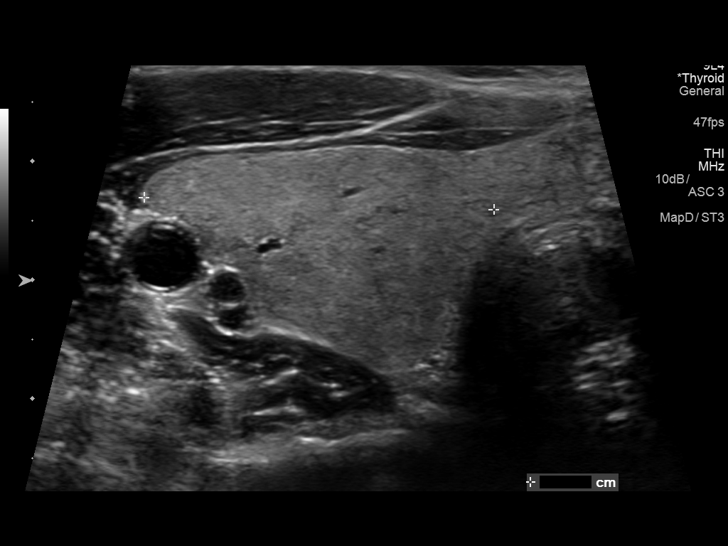
[im 28/82]
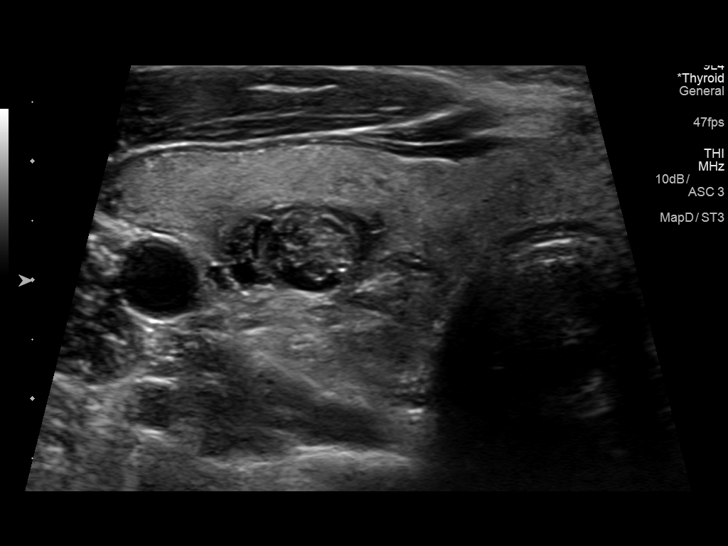
[im 34/82]
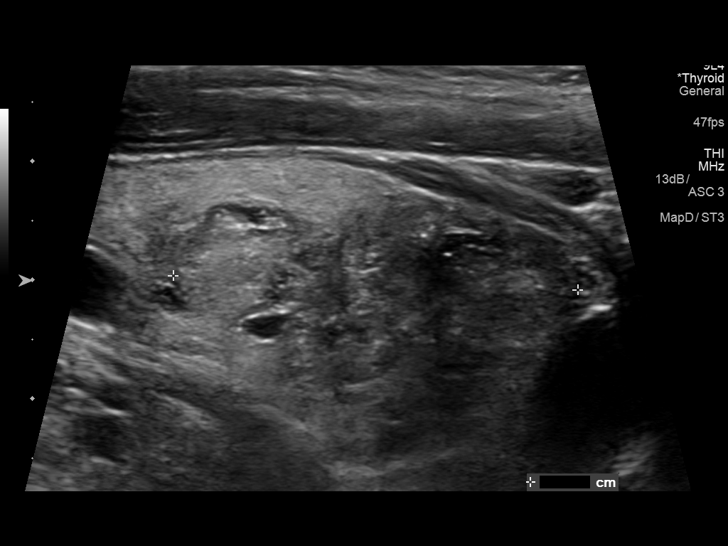
[im 41/82]
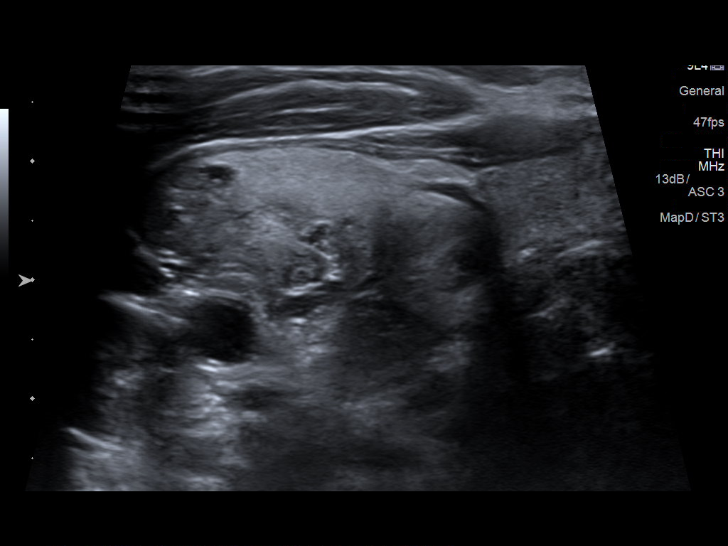
[im 48/82]
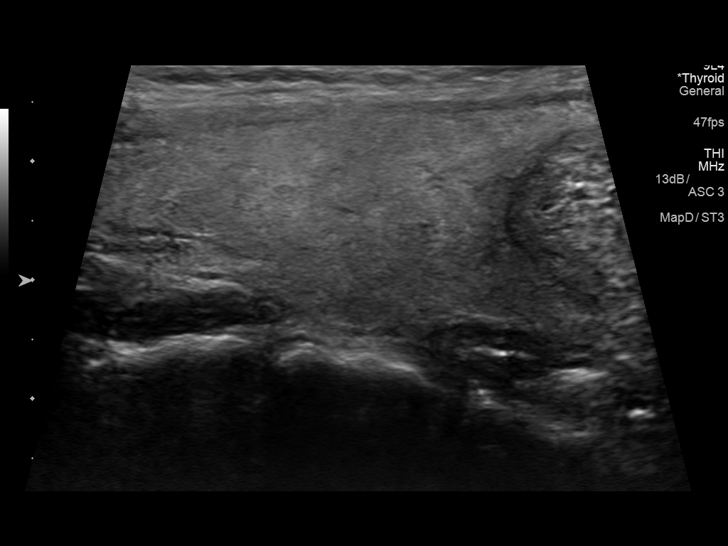
[im 55/82]
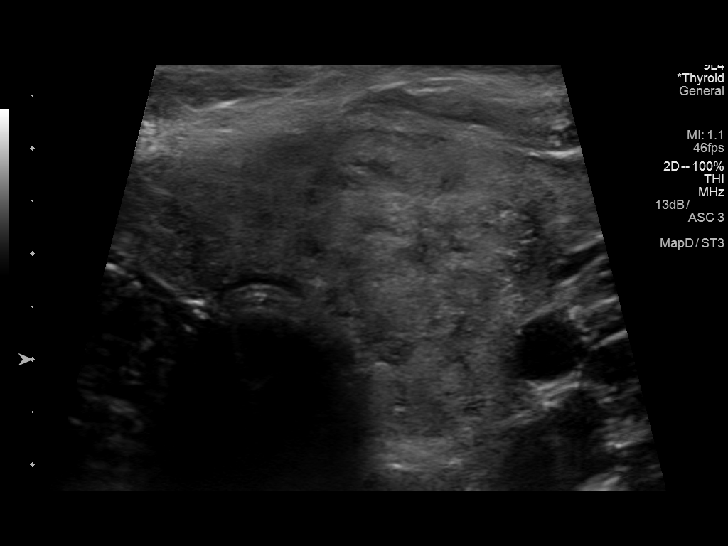
[im 61/82]
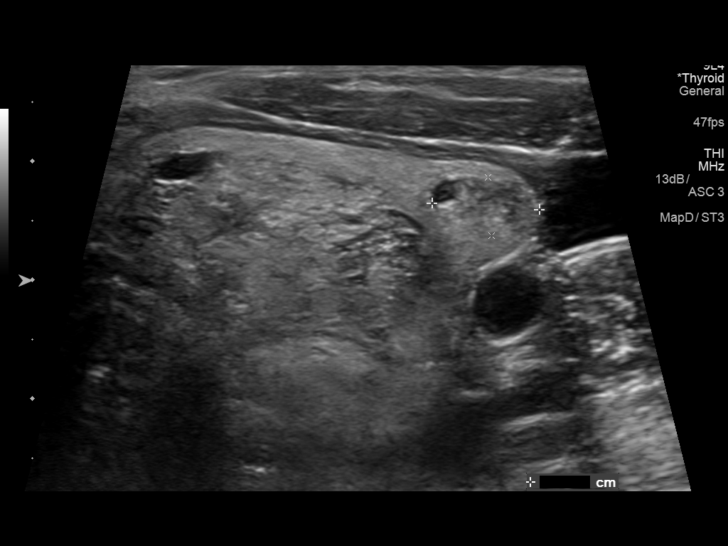
[im 68/82]
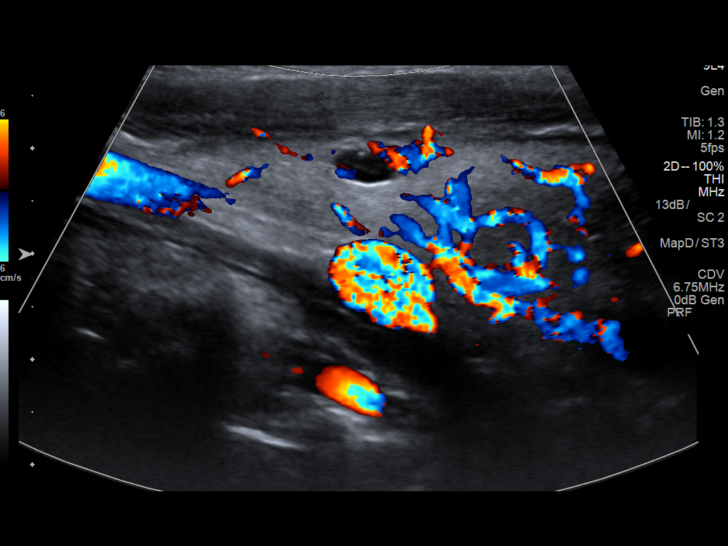
[im 75/82]
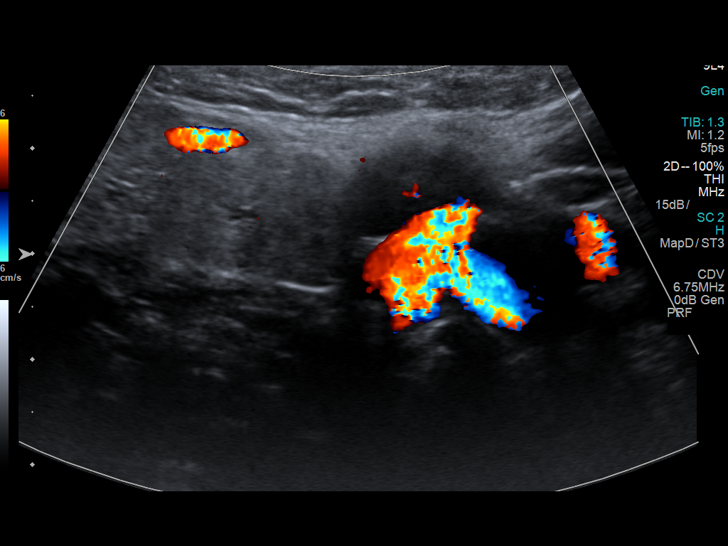
[im 82/82]
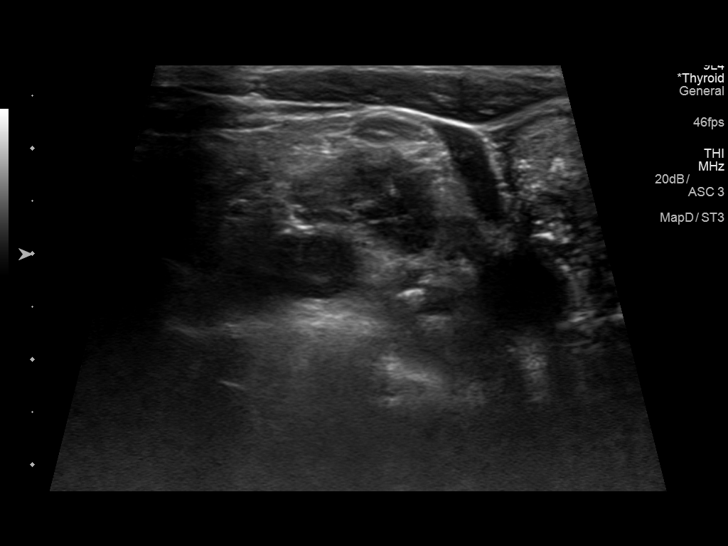

[13 of 25 positions shown; findings below may reference images not displayed]

FINDINGS: Parenchymal Echotexture: Mildly heterogenous

Isthmus: 0.5 cm

Right lobe: 7.4 cm x 2.6 cm x 3.0 cm

Left lobe: 6.4 cm x 2.9 cm x 3.4 cm

_________________________________________________________

Estimated total number of nodules >/= 1 cm: 6-10

Number of spongiform nodules >/=  2 cm not described below (TR1): 0

Number of mixed cystic and solid nodules >/= 1.5 cm not described
below (TR2): 0

_________________________________________________________

Nodule at the inferior right thyroid currently labeled 4 has been
previously biopsied. Cytology path report reports [REDACTED] category
2, benign.

Nodule at the inferior left thyroid, currently labeled 6 has been
previously biopsied. Cytology path report reports [REDACTED] category
1. Current diameter 3.8 cm, previously 4.1 cm, now measuring
smaller.

Nodule at the isthmus, currently labeled 1, previously biopsied.
Cytology path report reports [REDACTED] category 1. Current
measurement 1.6 cm, previously 1.1 cm, now measuring slightly
larger.

Nodule # 2:

Location: Right; Inferior

Maximum size: 1.6 cm; Other 2 dimensions: 1.3 cm x 0.8 cm

Composition: spongiform (0)

ACR TI-RADS recommendations:

Spongiform nodule does not meet criteria for surveillance or biopsy

_________________________________________________________

Nodule # 3:

Location: Right; Inferior

Maximum size: 1.1 cm; Other 2 dimensions: 1.1 cm x 0.8 cm

Composition: spongiform (0)

ACR TI-RADS recommendations:

Spongiform nodule does not meet criteria for surveillance or biopsy

_________________________________________________________

Nodule labeled 5 at the left mid thyroid has spongiform
characteristics, and does not meet criteria for surveillance or
biopsy.
IMPRESSION: Redemonstration of multinodular thyroid.

Right inferior nodule has been previously biopsied, [REDACTED] 2
benign result.

Nodule 1 (isthmic nodule) has been previously biopsied with [REDACTED]
1 result, and has grown in the interval. Referral

for repeat biopsy is indicated.

Nodule 6 (left lower pole) has been previously biopsied with
[REDACTED] 1 result, and is decreasing in size, likely benign.

No other thyroid nodule meets criteria for biopsy or surveillance,
as designated by the newly established ACR TI-RADS criteria.

Recommendations follow those established by the new ACR TI-RADS
criteria ([HOSPITAL] 5976;[DATE]).

## 2019-07-30 ENCOUNTER — Ambulatory Visit: Payer: 59 | Attending: Internal Medicine

## 2019-07-30 DIAGNOSIS — Z23 Encounter for immunization: Secondary | ICD-10-CM

## 2019-07-30 NOTE — Progress Notes (Signed)
   Covid-19 Vaccination Clinic  Name:  Monica [name]    MRN: [id number] DOB: 1980/11/18  07/30/2019  Monica [name] was observed post Covid-19 immunization for 15 minutes without incident. She was provided with Vaccine Information Sheet and instruction to access the V-Safe system.   Monica [name] was instructed to call 911 with any severe reactions post vaccine: Marland Kitchen Difficulty breathing  . Swelling of face and throat  . A fast heartbeat  . A bad rash all over body  . Dizziness and weakness   Immunizations Administered    Name Date Dose VIS Date Route   Pfizer COVID-19 Vaccine 07/30/2019 12:19 PM 0.3 mL 06/04/2018 Intramuscular   Manufacturer: [place]   Lot: [id number]   [place]: [id number]

## 2020-09-06 IMAGING — US ULTRASOUND FNA BIOPSY THYROID 1ST LESION
1 series · 13 of 20 positions shown · non-contrast
Comparison: US THYROID 07/24/18

MEDICATIONS:
1 mL 1% lidocaine

COMPLICATIONS:
None immediate.

INDICATION: Indeterminate thyroid nodule of the isthmus. Request is made for
fine-needle aspiration of indeterminate isthmus nodule.

EXAM:
ULTRASOUND GUIDED FINE NEEDLE ASPIRATION OF INDETERMINATE THYROID
NODULE
TECHNIQUE: Informed written consent was obtained from the patient after a
discussion of the risks, benefits and alternatives to treatment.
Questions regarding the procedure were encouraged and answered. A
timeout was performed prior to the initiation of the procedure.

[Series 1: ultrasound fna biopsy thyroid 1st lesion · 0.04mm/px · 20 acquisitions, 13 frames shown]
[im 1/20]
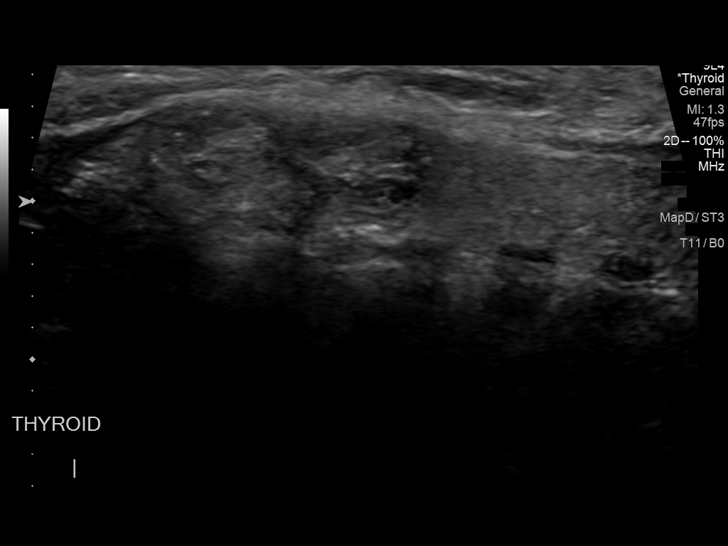
[im 3/20]
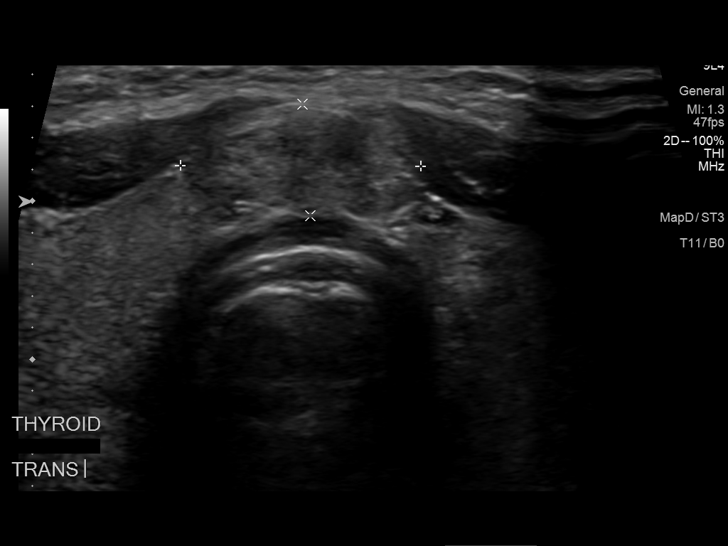
[im 4/20]
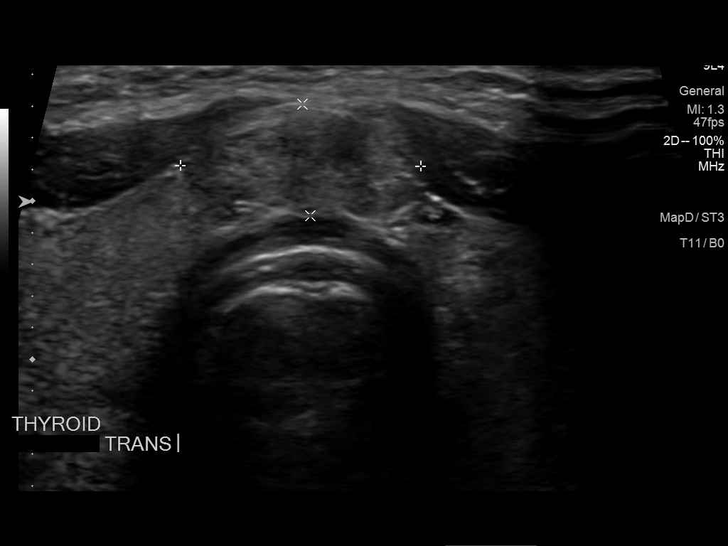
[im 6/20]
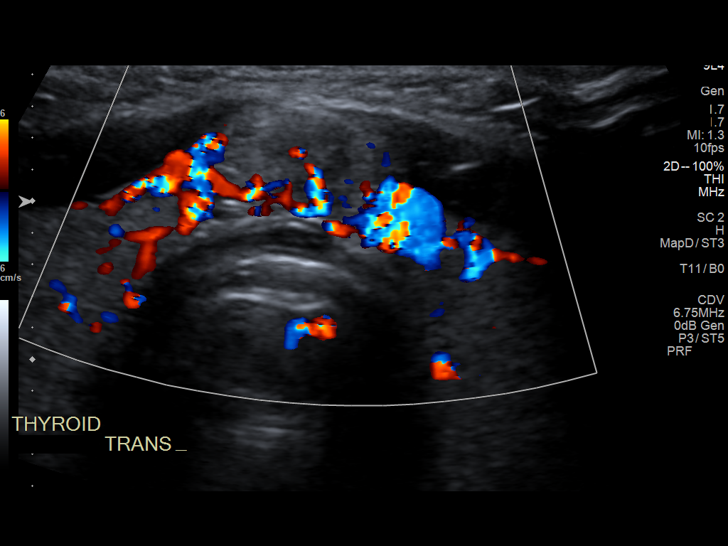
[im 7/20]
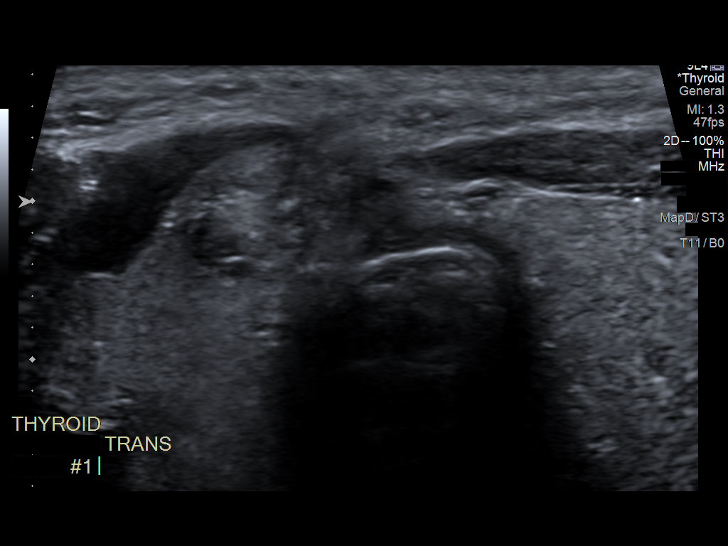
[im 9/20]
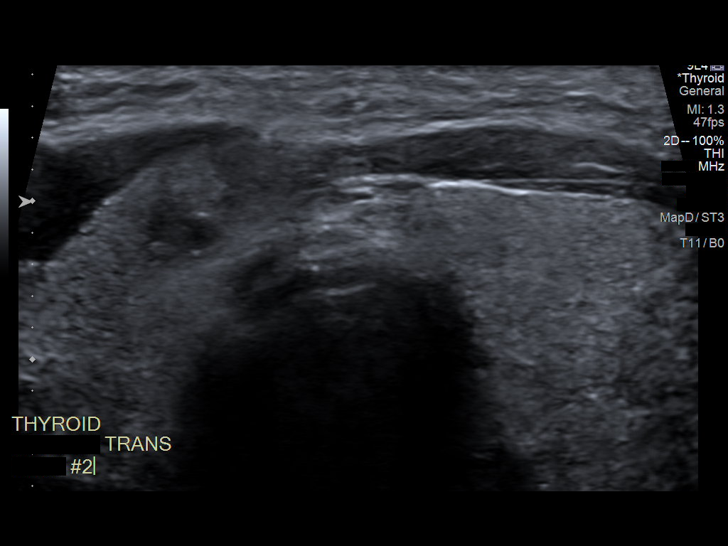
[im 11/20]
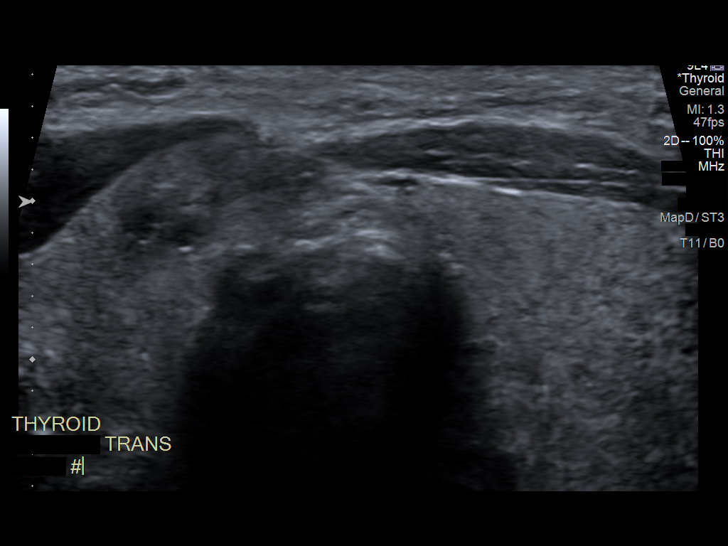
[im 12/20]
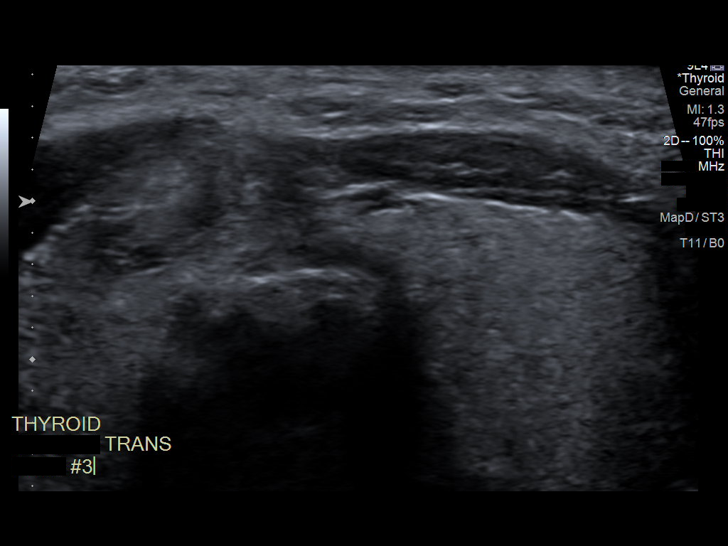
[im 14/20]
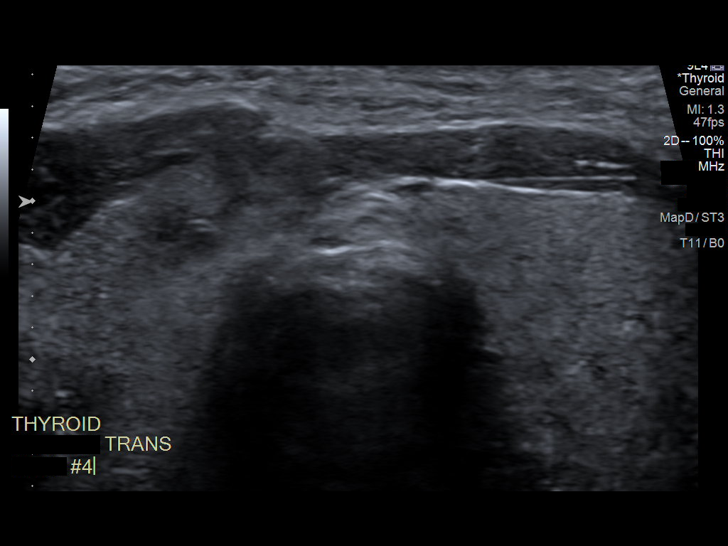
[im 15/20]
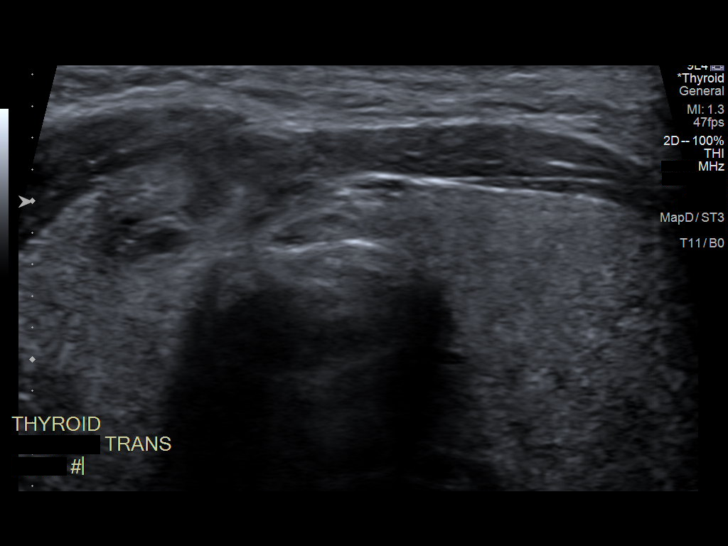
[im 17/20]
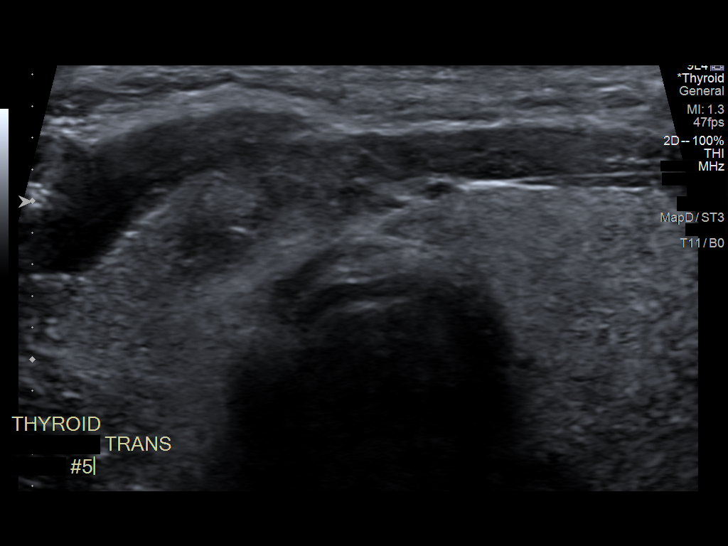
[im 18/20]
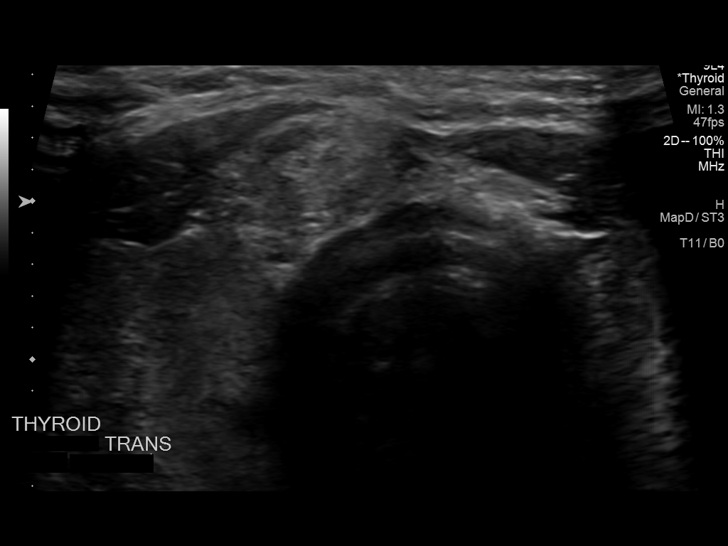
[im 20/20]
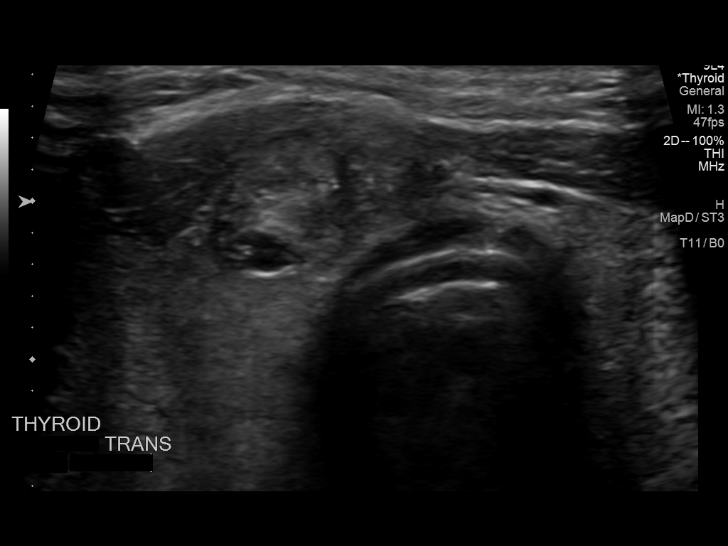

[13 of 20 positions shown; findings below may reference images not displayed]

Pre-procedural ultrasound scanning demonstrated unchanged size and
appearance of the indeterminate nodule within the isthmus of the
thyroid.

The procedure was planned. The neck was prepped in the usual sterile
fashion, and a sterile drape was applied covering the operative
field. A timeout was performed prior to the initiation of the
procedure. Local anesthesia was provided with 1% lidocaine.

Under direct ultrasound guidance, 5 FNA biopsies were performed of
the indeterminate isthmus nodule with a 25 gauge needle. Multiple
ultrasound images were saved for procedural documentation purposes.
The samples were prepared and submitted to pathology. Sample was
also prepared for Afirma testing.

Limited post procedural scanning was negative for hematoma or
additional complication. Dressings were placed. The patient
tolerated the above procedures procedure well without immediate
postprocedural complication.
FINDINGS: Nodule reference number based on prior diagnostic ultrasound: 1

Maximum size: 1.6 cm

Location: [place];

Reason for biopsy: patient/referrer request

Ultrasound imaging confirms appropriate placement of the needles
within the thyroid nodule.
IMPRESSION: Technically successful ultrasound guided fine needle aspiration of
indeterminate isthmus nodule.
